# Patient Record
Sex: Female | Born: 1960 | Race: White | Hispanic: No | Smoking: Former smoker
Health system: Southern US, Community
[De-identification: ages and names within clinical notes are randomized; demographics above are authoritative.]

## PROBLEM LIST (undated history)

## (undated) DIAGNOSIS — J181 Lobar pneumonia, unspecified organism: Secondary | ICD-10-CM

## (undated) DIAGNOSIS — D649 Anemia, unspecified: Secondary | ICD-10-CM

## (undated) DIAGNOSIS — G894 Chronic pain syndrome: Secondary | ICD-10-CM

## (undated) DIAGNOSIS — J9621 Acute and chronic respiratory failure with hypoxia: Secondary | ICD-10-CM

## (undated) DIAGNOSIS — I70209 Unspecified atherosclerosis of native arteries of extremities, unspecified extremity: Secondary | ICD-10-CM

## (undated) DIAGNOSIS — I739 Peripheral vascular disease, unspecified: Secondary | ICD-10-CM

## (undated) DIAGNOSIS — I2510 Atherosclerotic heart disease of native coronary artery without angina pectoris: Secondary | ICD-10-CM

## (undated) DIAGNOSIS — K922 Gastrointestinal hemorrhage, unspecified: Secondary | ICD-10-CM

## (undated) DIAGNOSIS — E1151 Type 2 diabetes mellitus with diabetic peripheral angiopathy without gangrene: Secondary | ICD-10-CM

## (undated) DIAGNOSIS — I482 Chronic atrial fibrillation, unspecified: Secondary | ICD-10-CM

## (undated) HISTORY — PX: ANGIOPLASTY: SHX39

## (undated) HISTORY — PX: KNEE SURGERY: SHX244

## (undated) HISTORY — PX: TONSILLECTOMY: SUR1361

## (undated) HISTORY — PX: CHOLECYSTECTOMY: SHX55

## (undated) HISTORY — PX: CORONARY ARTERY BYPASS GRAFT: SHX141

## (undated) HISTORY — PX: COLONOSCOPY: SHX174

## (undated) HISTORY — PX: HYSTERECTOMY ABDOMINAL WITH SALPINGECTOMY: SHX6725

---

## 2017-07-14 ENCOUNTER — Other Ambulatory Visit (HOSPITAL_COMMUNITY): Payer: Medicare Other

## 2017-07-14 ENCOUNTER — Inpatient Hospital Stay
Admission: RE | Admit: 2017-07-14 | Discharge: 2017-07-25 | Disposition: A | Discharge status: Nursing Home (Includes ICF) | Payer: Medicare Other | Source: Ambulatory Visit | Attending: Internal Medicine | Admitting: Internal Medicine

## 2017-07-14 DIAGNOSIS — J181 Lobar pneumonia, unspecified organism: Secondary | ICD-10-CM | POA: Diagnosis present

## 2017-07-14 DIAGNOSIS — J9690 Respiratory failure, unspecified, unspecified whether with hypoxia or hypercapnia: Secondary | ICD-10-CM

## 2017-07-14 DIAGNOSIS — I480 Paroxysmal atrial fibrillation: Secondary | ICD-10-CM | POA: Diagnosis present

## 2017-07-14 DIAGNOSIS — I482 Chronic atrial fibrillation, unspecified: Secondary | ICD-10-CM | POA: Diagnosis present

## 2017-07-14 DIAGNOSIS — J9621 Acute and chronic respiratory failure with hypoxia: Secondary | ICD-10-CM | POA: Diagnosis present

## 2017-07-14 DIAGNOSIS — Z978 Presence of other specified devices: Secondary | ICD-10-CM

## 2017-07-14 DIAGNOSIS — I2510 Atherosclerotic heart disease of native coronary artery without angina pectoris: Secondary | ICD-10-CM | POA: Diagnosis present

## 2017-07-14 DIAGNOSIS — T85598A Other mechanical complication of other gastrointestinal prosthetic devices, implants and grafts, initial encounter: Secondary | ICD-10-CM

## 2017-07-14 HISTORY — DX: Acute and chronic respiratory failure with hypoxia: J96.21

## 2017-07-14 HISTORY — DX: Lobar pneumonia, unspecified organism: J18.1

## 2017-07-14 HISTORY — DX: Anemia, unspecified: D64.9

## 2017-07-14 HISTORY — DX: Type 2 diabetes mellitus with diabetic peripheral angiopathy without gangrene: E11.51

## 2017-07-14 HISTORY — DX: Chronic atrial fibrillation, unspecified: I48.20

## 2017-07-14 HISTORY — DX: Unspecified atherosclerosis of native arteries of extremities, unspecified extremity: I70.209

## 2017-07-14 HISTORY — DX: Chronic pain syndrome: G89.4

## 2017-07-14 HISTORY — DX: Gastrointestinal hemorrhage, unspecified: K92.2

## 2017-07-14 HISTORY — DX: Peripheral vascular disease, unspecified: I73.9

## 2017-07-14 HISTORY — DX: Atherosclerotic heart disease of native coronary artery without angina pectoris: I25.10

## 2017-07-14 LAB — BLOOD GAS, ARTERIAL
Acid-Base Excess: 1.2 mmol/L (ref 0.0–2.0)
BICARBONATE: 24.3 mmol/L (ref 20.0–28.0)
FIO2: 35
O2 Saturation: 98.4 %
PATIENT TEMPERATURE: 100.5
PEEP: 5 cmH2O
RATE: 20 resp/min
VT: 450 mL
pCO2 arterial: 33.5 mmHg (ref 32.0–48.0)
pH, Arterial: 7.479 — ABNORMAL HIGH (ref 7.350–7.450)
pO2, Arterial: 107 mmHg (ref 83.0–108.0)

## 2017-07-14 LAB — URINALYSIS, ROUTINE W REFLEX MICROSCOPIC
BILIRUBIN URINE: NEGATIVE
Glucose, UA: NEGATIVE mg/dL
Ketones, ur: 5 mg/dL — AB
Nitrite: NEGATIVE
PROTEIN: 100 mg/dL — AB
SPECIFIC GRAVITY, URINE: 1.017 (ref 1.005–1.030)
pH: 5 (ref 5.0–8.0)

## 2017-07-15 LAB — C DIFFICILE QUICK SCREEN W PCR REFLEX
C DIFFICILE (CDIFF) INTERP: DETECTED
C Diff antigen: POSITIVE — AB
C Diff toxin: POSITIVE — AB

## 2017-07-15 LAB — CBC WITH DIFFERENTIAL/PLATELET
Abs Immature Granulocytes: 0.1 10*3/uL (ref 0.0–0.1)
Basophils Absolute: 0 10*3/uL (ref 0.0–0.1)
Basophils Relative: 0 %
EOS PCT: 1 %
Eosinophils Absolute: 0.1 10*3/uL (ref 0.0–0.7)
HEMATOCRIT: 33.7 % — AB (ref 36.0–46.0)
HEMOGLOBIN: 11.1 g/dL — AB (ref 12.0–15.0)
Immature Granulocytes: 1 %
LYMPHS ABS: 0.8 10*3/uL (ref 0.7–4.0)
Lymphocytes Relative: 7 %
MCH: 29.5 pg (ref 26.0–34.0)
MCHC: 32.9 g/dL (ref 30.0–36.0)
MCV: 89.6 fL (ref 78.0–100.0)
MONO ABS: 0.8 10*3/uL (ref 0.1–1.0)
Monocytes Relative: 8 %
Neutro Abs: 8.5 10*3/uL — ABNORMAL HIGH (ref 1.7–7.7)
Neutrophils Relative %: 83 %
Platelets: 189 10*3/uL (ref 150–400)
RBC: 3.76 MIL/uL — ABNORMAL LOW (ref 3.87–5.11)
RDW: 16.2 % — ABNORMAL HIGH (ref 11.5–15.5)
WBC: 10.2 10*3/uL (ref 4.0–10.5)

## 2017-07-15 LAB — HEMOGLOBIN A1C
HEMOGLOBIN A1C: 6.1 % — AB (ref 4.8–5.6)
MEAN PLASMA GLUCOSE: 128.37 mg/dL

## 2017-07-15 LAB — COMPREHENSIVE METABOLIC PANEL
ALBUMIN: 2.7 g/dL — AB (ref 3.5–5.0)
ALT: 15 U/L (ref 0–44)
ANION GAP: 13 (ref 5–15)
AST: 17 U/L (ref 15–41)
Alkaline Phosphatase: 69 U/L (ref 38–126)
BUN: 32 mg/dL — AB (ref 6–20)
CALCIUM: 9.9 mg/dL (ref 8.9–10.3)
CO2: 24 mmol/L (ref 22–32)
Chloride: 101 mmol/L (ref 98–111)
Creatinine, Ser: 1 mg/dL (ref 0.44–1.00)
GFR calc Af Amer: >60 mL/min (ref >60)
GLUCOSE: 179 mg/dL — AB (ref 70–99)
Potassium: 3.3 mmol/L — ABNORMAL LOW (ref 3.5–5.1)
Sodium: 138 mmol/L (ref 135–145)
TOTAL PROTEIN: 6.9 g/dL (ref 6.5–8.1)
Total Bilirubin: 0.9 mg/dL (ref 0.3–1.2)

## 2017-07-15 LAB — PROTIME-INR
INR: 1.12
Prothrombin Time: 14.3 seconds (ref 11.4–15.2)

## 2017-07-15 LAB — URINE CULTURE

## 2017-07-15 LAB — TSH: TSH: 3.047 u[IU]/mL (ref 0.350–4.500)

## 2017-07-15 LAB — PHOSPHORUS: Phosphorus: 3.2 mg/dL (ref 2.5–4.6)

## 2017-07-15 LAB — MAGNESIUM: Magnesium: 2.1 mg/dL (ref 1.7–2.4)

## 2017-07-16 ENCOUNTER — Encounter: Payer: Self-pay | Admitting: Internal Medicine

## 2017-07-16 DIAGNOSIS — I482 Chronic atrial fibrillation, unspecified: Secondary | ICD-10-CM | POA: Diagnosis present

## 2017-07-16 DIAGNOSIS — I480 Paroxysmal atrial fibrillation: Secondary | ICD-10-CM | POA: Diagnosis present

## 2017-07-16 DIAGNOSIS — I2510 Atherosclerotic heart disease of native coronary artery without angina pectoris: Secondary | ICD-10-CM | POA: Diagnosis not present

## 2017-07-16 DIAGNOSIS — J181 Lobar pneumonia, unspecified organism: Secondary | ICD-10-CM | POA: Diagnosis not present

## 2017-07-16 DIAGNOSIS — I2583 Coronary atherosclerosis due to lipid rich plaque: Secondary | ICD-10-CM

## 2017-07-16 DIAGNOSIS — J9621 Acute and chronic respiratory failure with hypoxia: Secondary | ICD-10-CM | POA: Diagnosis not present

## 2017-07-16 LAB — BASIC METABOLIC PANEL
ANION GAP: 9 (ref 5–15)
BUN: 34 mg/dL — AB (ref 6–20)
CO2: 29 mmol/L (ref 22–32)
CREATININE: 0.98 mg/dL (ref 0.44–1.00)
Calcium: 10.1 mg/dL (ref 8.9–10.3)
Chloride: 101 mmol/L (ref 98–111)
GFR calc Af Amer: >60 mL/min (ref >60)
GFR calc non Af Amer: >60 mL/min (ref >60)
GLUCOSE: 217 mg/dL — AB (ref 70–99)
Potassium: 4.1 mmol/L (ref 3.5–5.1)
Sodium: 139 mmol/L (ref 135–145)

## 2017-07-16 LAB — PHOSPHORUS: Phosphorus: 3.2 mg/dL (ref 2.5–4.6)

## 2017-07-16 LAB — CBC
HEMATOCRIT: 33.4 % — AB (ref 36.0–46.0)
Hemoglobin: 10.9 g/dL — ABNORMAL LOW (ref 12.0–15.0)
MCH: 29.3 pg (ref 26.0–34.0)
MCHC: 32.6 g/dL (ref 30.0–36.0)
MCV: 89.8 fL (ref 78.0–100.0)
Platelets: 202 10*3/uL (ref 150–400)
RBC: 3.72 MIL/uL — AB (ref 3.87–5.11)
RDW: 15.9 % — AB (ref 11.5–15.5)
WBC: 9.3 10*3/uL (ref 4.0–10.5)

## 2017-07-16 LAB — MAGNESIUM: Magnesium: 2.3 mg/dL (ref 1.7–2.4)

## 2017-07-16 NOTE — Consult Note (Signed)
Pulmonary Critical Care Medicine [facility] [place]  PULMONARY SERVICE  Date of Service: 07/16/2017  PULMONARY CONSULT   Martha Carter  UJW:[id number]  DOB: 1960-11-02   DOA: 07/14/2017  Referring Physician: Carron Curie, MD  HPI: Martha Carter is a 57 y.o. female seen for follow up of Acute on Chronic Respiratory Failure.  Patient has complicated course as follows patient has a medical history significant coronary artery disease status post CABG peripheral arterial disease hypertension presented to the hospital with complaints of chest pain.  Patient is also been complaining of having some nausea and vomiting.  She was seen for the pain which slightly improved after 1 nitroglycerin.  At the time did not appear to be any radiation.  Patient had a chest x-ray done which showed pneumonia.  She was started on IV antibiotics and while she was admitted at increased work of breathing and decompensated eventually ended up having to be intubated.  After treatment on the ventilator patient was attempted at extubation however failed.  Patient was placed back on the ventilator with a size 8 ET tube.  She is been on antibiotics and has completed the course.  The patient was transferred to our facility for further management and weaning.  Review of Systems:  ROS performed and is unremarkable other than noted above.  Past Medical History:  Diagnosis Date  . Acute on chronic respiratory failure with hypoxia (HCC)   . Chronic anemia   . Chronic atrial fibrillation (HCC)   . Chronic pain syndrome   . Coronary artery disease   . 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 GI bleed   . Lobar pneumonia, unspecified organism (HCC)   . Peripheral arterial disease [facility])     Past Surgical History:  Procedure Laterality Date  . ANGIOPLASTY    . CHOLECYSTECTOMY    . COLONOSCOPY    . CORONARY ARTERY BYPASS GRAFT    . HYSTERECTOMY ABDOMINAL WITH SALPINGECTOMY     . KNEE SURGERY    . TONSILLECTOMY      Social History:    reports that she has quit smoking. She has never used smokeless tobacco. She reports that she drank alcohol. She reports that she has current or past drug history.  Family History: Non-Contributory to the present illness  Allergies include sulfa clindamycin ACE inhibitors doxycycline hydromorphone and morphine   Medications: Reviewed on Rounds  Physical Exam:  Vitals: Temperature 98.9 pulse 100 respiratory 24 blood pressure 130/73 saturations 99%  Ventilator Settings mode of ventilation assist control FiO2 35% tidal volume 467.5  . General: Comfortable at this time . Eyes: Grossly normal lids, irises & conjunctiva . ENT: grossly tongue is normal . Neck: no obvious mass . Cardiovascular: S1-S2 normal no gallop or rub . Respiratory: Coarse rhonchi bilaterally . Abdomen: Obese and soft . Skin: no rash seen on limited exam . Musculoskeletal: not rigid . Psychiatric:unable to assess . Neurologic: no seizure no involuntary movements         Labs on Admission:  Basic Metabolic Panel: Recent Labs  Lab 07/15/17 0618 07/16/17 0651  NA 138 139  K 3.3* 4.1  CL 101 101  CO2 24 29  GLUCOSE 179* 217*  BUN 32* 34*  CREATININE 1.00 0.98  CALCIUM 9.9 10.1  MG 2.1 2.3  PHOS 3.2 3.2    Liver Function Tests: Recent Labs  Lab 07/15/17 0618  AST 17  ALT 15  ALKPHOS 69  BILITOT 0.9  PROT 6.9  ALBUMIN 2.7*   No results for input(s): LIPASE, AMYLASE in the last 168 hours. No results for input(s): AMMONIA in the last 168 hours.  CBC: Recent Labs  Lab 07/15/17 0618 07/16/17 0651  WBC 10.2 9.3  NEUTROABS 8.5*  --   HGB 11.1* 10.9*  HCT 33.7* 33.4*  MCV 89.6 89.8  PLT 189 202    Cardiac Enzymes: No results for input(s): CKTOTAL, CKMB, CKMBINDEX, TROPONINI in the last 168 hours.  BNP (last 3 results) No results for input(s): BNP in the last 8760 hours.  ProBNP (last 3 results) No results for  input(s): PROBNP in the last 8760 hours.   Radiological Exams on Admission: Dg Chest Port 1 View  Result Date: 07/14/2017 CLINICAL DATA:  Intubation. EXAM: PORTABLE CHEST 1 VIEW COMPARISON:  None. FINDINGS: Endotracheal tube tip projects 4.6 cm above the Carina. Nasal/orogastric tube passes well below the diaphragm into the stomach and below the included field of view. Right PICC tip projects at the caval atrial junction. Lungs demonstrate vascular congestion, interstitial prominence and mild hazy opacity. More confluent opacity is noted at the left lung base consistent with pleural fluid. There is a probable small right pleural effusion. There changes from a prior median sternotomy. Cardiac silhouette is normal in size. No convincing mediastinal or hilar masses. No evidence of a pneumothorax on this semi-erect study. IMPRESSION: 1. Endotracheal tube tip projects 4.6 cm above the Carina. 2. Well-positioned nasal/orogastric tube and right PICC. 3. Findings consistent with congestive heart failure with interstitial and hazy airspace pulmonary edema and left greater than right pleural effusions. Electronically Signed   By: Amie Portlandavid  Ormond M.D.   On: 07/14/2017 18:31   Dg Abd Portable 1v  Result Date: 07/14/2017 CLINICAL DATA:  Encounter for nasogastric tube placement. EXAM: PORTABLE ABDOMEN - 1 VIEW COMPARISON:  None. FINDINGS: Nasogastric tube passes well below the diaphragm into the mid stomach. There is no bowel dilation to suggest obstruction. There are surgical clips are upper quadrant from a prior cholecystectomy. IMPRESSION: Well-positioned nasogastric tube. Electronically Signed   By: Amie Portlandavid  Ormond M.D.   On: 07/14/2017 18:31    Assessment/Plan Active Problems:   Acute on chronic respiratory failure with hypoxia (HCC)   Lobar pneumonia, unspecified organism (HCC)   Coronary artery disease   Chronic atrial fibrillation (HCC)   1. Acute on chronic respiratory failure with hypoxia patient  remains on full support orally intubated on the ventilator.  She is failed extubation trial at the other facility.  Spoken with her she is has issues with anxiety currently is on propofol.  She is also requiring restraints because she is been pulling at her devices.  Spoke with her and her brother who was in the room try to keep her calm and relaxed hopefully we should be able to try to start on weaning protocol and see if she is possible if she fails then will need a tracheostomy done. 2. Lobar pneumonia treated with antibiotics last chest x-ray shows some haziness possible congestive heart failure.  Continue to follow-up with x-ray as discussed with primary care team. 3. Coronary artery disease stable currently is pain-free 4. Chronic atrial fibrillation rate is controlled we will continue to follow along.  I have personally seen and evaluated the patient, evaluated laboratory and imaging results, formulated the assessment and plan and placed orders. The Patient requires high complexity decision making for assessment and support.  Case was discussed on Rounds with the Respiratory Therapy Staff patient is critically  ill in danger of cardiac arrest remains orally intubated requiring advanced monitoring techniques as well as sedation with propofol.  Time spent 40 minutes critical care  Yevonne Pax, MD [facility] Pulmonary Critical Care Medicine Sleep Medicine

## 2017-07-17 ENCOUNTER — Other Ambulatory Visit (HOSPITAL_COMMUNITY): Payer: Medicare Other

## 2017-07-17 DIAGNOSIS — I482 Chronic atrial fibrillation: Secondary | ICD-10-CM | POA: Diagnosis not present

## 2017-07-17 DIAGNOSIS — J181 Lobar pneumonia, unspecified organism: Secondary | ICD-10-CM | POA: Diagnosis not present

## 2017-07-17 DIAGNOSIS — I2510 Atherosclerotic heart disease of native coronary artery without angina pectoris: Secondary | ICD-10-CM | POA: Diagnosis not present

## 2017-07-17 DIAGNOSIS — J9621 Acute and chronic respiratory failure with hypoxia: Secondary | ICD-10-CM | POA: Diagnosis not present

## 2017-07-17 LAB — CBC
HCT: 33.7 % — ABNORMAL LOW (ref 36.0–46.0)
HEMOGLOBIN: 10.9 g/dL — AB (ref 12.0–15.0)
MCH: 28.8 pg (ref 26.0–34.0)
MCHC: 32.3 g/dL (ref 30.0–36.0)
MCV: 88.9 fL (ref 78.0–100.0)
Platelets: 213 10*3/uL (ref 150–400)
RBC: 3.79 MIL/uL — ABNORMAL LOW (ref 3.87–5.11)
RDW: 15.7 % — ABNORMAL HIGH (ref 11.5–15.5)
WBC: 9.8 10*3/uL (ref 4.0–10.5)

## 2017-07-17 LAB — CULTURE, RESPIRATORY

## 2017-07-17 LAB — CULTURE, RESPIRATORY W GRAM STAIN

## 2017-07-17 LAB — BASIC METABOLIC PANEL
Anion gap: 13 (ref 5–15)
BUN: 38 mg/dL — ABNORMAL HIGH (ref 6–20)
CHLORIDE: 100 mmol/L (ref 98–111)
CO2: 30 mmol/L (ref 22–32)
CREATININE: 0.97 mg/dL (ref 0.44–1.00)
Calcium: 10.2 mg/dL (ref 8.9–10.3)
GFR calc Af Amer: >60 mL/min (ref >60)
Glucose, Bld: 235 mg/dL — ABNORMAL HIGH (ref 70–99)
Potassium: 4.2 mmol/L (ref 3.5–5.1)
SODIUM: 143 mmol/L (ref 135–145)

## 2017-07-17 LAB — MAGNESIUM: MAGNESIUM: 2.4 mg/dL (ref 1.7–2.4)

## 2017-07-17 LAB — PHOSPHORUS: Phosphorus: 3.3 mg/dL (ref 2.5–4.6)

## 2017-07-17 MED ORDER — MELATONIN 3 MG PO TABS
3.00 | ORAL_TABLET | ORAL | Status: DC
Start: 2017-07-14 — End: 2017-07-17

## 2017-07-17 MED ORDER — ASPIRIN 81 MG PO CHEW
81.00 | CHEWABLE_TABLET | ORAL | Status: DC
Start: 2017-07-15 — End: 2017-07-17

## 2017-07-17 MED ORDER — DIPHENHYDRAMINE HCL 50 MG/ML IJ SOLN
12.50 | INTRAMUSCULAR | Status: DC
Start: 2017-07-15 — End: 2017-07-17

## 2017-07-17 MED ORDER — BISACODYL 5 MG PO TBEC
10.00 | DELAYED_RELEASE_TABLET | ORAL | Status: DC
Start: ? — End: 2017-07-17

## 2017-07-17 MED ORDER — FAMOTIDINE 20 MG PO TABS
20.00 | ORAL_TABLET | ORAL | Status: DC
Start: 2017-07-15 — End: 2017-07-17

## 2017-07-17 MED ORDER — HYDROCODONE-ACETAMINOPHEN 5-325 MG PO TABS
1.00 | ORAL_TABLET | ORAL | Status: DC
Start: ? — End: 2017-07-17

## 2017-07-17 MED ORDER — PANTOPRAZOLE SODIUM 40 MG IV SOLR
40.00 | INTRAVENOUS | Status: DC
Start: 2017-07-14 — End: 2017-07-17

## 2017-07-17 MED ORDER — GUAIFENESIN 100 MG/5ML PO SYRP
200.00 | ORAL_SOLUTION | ORAL | Status: DC
Start: ? — End: 2017-07-17

## 2017-07-17 MED ORDER — GENERIC EXTERNAL MEDICATION
Status: DC
Start: ? — End: 2017-07-17

## 2017-07-17 MED ORDER — PROPOFOL 100 MG/10ML IV EMUL
0.00 | INTRAVENOUS | Status: DC
Start: ? — End: 2017-07-17

## 2017-07-17 MED ORDER — ACETAMINOPHEN 325 MG PO TABS
650.00 | ORAL_TABLET | ORAL | Status: DC
Start: ? — End: 2017-07-17

## 2017-07-17 MED ORDER — FUROSEMIDE 10 MG/ML IJ SOLN
40.00 | INTRAMUSCULAR | Status: DC
Start: 2017-07-15 — End: 2017-07-17

## 2017-07-17 MED ORDER — NITROGLYCERIN 0.4 MG SL SUBL
0.40 | SUBLINGUAL_TABLET | SUBLINGUAL | Status: DC
Start: ? — End: 2017-07-17

## 2017-07-17 MED ORDER — GABAPENTIN 300 MG PO CAPS
300.00 | ORAL_CAPSULE | ORAL | Status: DC
Start: 2017-07-14 — End: 2017-07-17

## 2017-07-17 MED ORDER — ONDANSETRON HCL 4 MG/2ML IJ SOLN
8.00 | INTRAMUSCULAR | Status: DC
Start: ? — End: 2017-07-17

## 2017-07-17 MED ORDER — ATORVASTATIN CALCIUM 10 MG PO TABS
20.00 | ORAL_TABLET | ORAL | Status: DC
Start: 2017-07-14 — End: 2017-07-17

## 2017-07-17 MED ORDER — LORAZEPAM 2 MG/ML IJ SOLN
1.00 | INTRAMUSCULAR | Status: DC
Start: ? — End: 2017-07-17

## 2017-07-17 MED ORDER — PROMETHAZINE HCL 25 MG/ML IJ SOLN
6.25 | INTRAMUSCULAR | Status: DC
Start: ? — End: 2017-07-17

## 2017-07-17 MED ORDER — INSULIN LISPRO 100 UNIT/ML ~~LOC~~ SOLN
5.00 | SUBCUTANEOUS | Status: DC
Start: 2017-07-14 — End: 2017-07-17

## 2017-07-17 MED ORDER — NYSTATIN 100000 UNIT/GM EX POWD
1.00 | CUTANEOUS | Status: DC
Start: 2017-07-14 — End: 2017-07-17

## 2017-07-17 MED ORDER — THERA-M PO TABS
1.00 | ORAL_TABLET | ORAL | Status: DC
Start: 2017-07-15 — End: 2017-07-17

## 2017-07-17 MED ORDER — NITROGLYCERIN 2 % TD OINT
.50 | TOPICAL_OINTMENT | TRANSDERMAL | Status: DC
Start: 2017-07-14 — End: 2017-07-17

## 2017-07-17 MED ORDER — CARVEDILOL 25 MG PO TABS
25.00 | ORAL_TABLET | ORAL | Status: DC
Start: 2017-07-14 — End: 2017-07-17

## 2017-07-17 MED ORDER — INSULIN REGULAR HUMAN 100 UNIT/ML IJ SOLN
0.00 | INTRAMUSCULAR | Status: DC
Start: 2017-07-14 — End: 2017-07-17

## 2017-07-17 MED ORDER — IPRATROPIUM-ALBUTEROL 0.5-2.5 (3) MG/3ML IN SOLN
3.00 | RESPIRATORY_TRACT | Status: DC
Start: 2017-07-14 — End: 2017-07-17

## 2017-07-17 MED ORDER — DOCUSATE SODIUM 100 MG PO CAPS
100.00 | ORAL_CAPSULE | ORAL | Status: DC
Start: ? — End: 2017-07-17

## 2017-07-17 MED ORDER — CITALOPRAM HYDROBROMIDE 20 MG PO TABS
10.00 | ORAL_TABLET | ORAL | Status: DC
Start: 2017-07-15 — End: 2017-07-17

## 2017-07-17 MED ORDER — POTASSIUM CHLORIDE CRYS ER 20 MEQ PO TBCR
20.00 | EXTENDED_RELEASE_TABLET | ORAL | Status: DC
Start: 2017-07-15 — End: 2017-07-17

## 2017-07-17 NOTE — Progress Notes (Signed)
[facility] [place]   PULMONARY SERVICE  PROGRESS NOTE  Date of Service: 07/17/2017  Laurina BustleGlenda W Ferriss  WUJ:[id number]  DOB: 04/15/1960   DOA: 07/14/2017  Referring Physician: Carron CurieAli Hijazi, MD  HPI: Laurina BustleGlenda W Graw is a [age] y.o. female seen for follow up of Acute on Chronic Respiratory Failure.  She is doing well was not having much breathing above the spontaneous or above the set rate.  Respiratory rate was therefore ordered to be decreased.  Patient right now is on assist control mode  Medications: Reviewed on Rounds  Physical Exam:  Vitals: Temperature 98.8 pulse 94 respiratory 18 blood pressure 112/95 saturations 100%  Ventilator Settings mode of ventilation assist control FiO2 28% tidal volume 435 PEEP 5  . General: Comfortable at this time . Eyes: Grossly normal lids, irises & conjunctiva . ENT: grossly tongue is normal . Neck: no obvious mass . Cardiovascular: S1 S2 normal no gallop . Respiratory: Scattered rhonchi noted . Abdomen: soft . Skin: no rash seen on limited exam . Musculoskeletal: not rigid . Psychiatric:unable to assess . Neurologic: no seizure no involuntary movements         Lab Data:   Basic Metabolic Panel: Recent Labs  Lab 07/15/17 0618 07/16/17 0651 07/17/17 0705  NA 138 139 143  K 3.3* 4.1 4.2  CL 101 101 100  CO2 24 29 30   GLUCOSE 179* 217* 235*  BUN 32* 34* 38*  CREATININE 1.00 0.98 0.97  CALCIUM 9.9 10.1 10.2  MG 2.1 2.3 2.4  PHOS 3.2 3.2 3.3    Liver Function Tests: Recent Labs  Lab 07/15/17 0618  AST 17  ALT 15  ALKPHOS 69  BILITOT 0.9  PROT 6.9  ALBUMIN 2.7*   No results for input(s): LIPASE, AMYLASE in the last 168 hours. No results for input(s): AMMONIA in the last 168 hours.  CBC: Recent Labs  Lab 07/15/17 0618 07/16/17 0651 07/17/17 0705  WBC 10.2 9.3 9.8  NEUTROABS 8.5*  --   --   HGB 11.1* 10.9* 10.9*  HCT 33.7* 33.4* 33.7*  MCV 89.6 89.8 88.9  PLT 189 202  213    Cardiac Enzymes: No results for input(s): CKTOTAL, CKMB, CKMBINDEX, TROPONINI in the last 168 hours.  BNP (last 3 results) No results for input(s): BNP in the last 8760 hours.  ProBNP (last 3 results) No results for input(s): PROBNP in the last 8760 hours.  Radiological Exams: Dg Chest Port 1 View  Result Date: 07/17/2017 CLINICAL DATA:  57 year old female with respiratory failure. Initial encounter. EXAM: PORTABLE CHEST 1 VIEW COMPARISON:  07/14/2017 chest x-ray. FINDINGS: Endotracheal tube tip 2.8 cm above the carina. Right central line tip distal superior vena cava level. Nasogastric tube courses below the diaphragm. Tip is not included on the present exam. Side hole just beyond the gastroesophageal junction. Improved aeration right lung. Persistent consolidation left lung which may partially be explained by posteriorly layering left-sided pleural effusion. Left base atelectasis or infiltrate may be present. No pneumothorax noted. Mild cardiomegaly. No acute osseous abnormality. IMPRESSION: Improved aeration right lung. Suspect posteriorly layering left-sided pleural effusion and left base atelectasis versus infiltrate. Mild cardiomegaly. Electronically Signed   By: Lacy DuverneySteven  Olson M.D.   On: 07/17/2017 07:54    Assessment/Plan Active Problems:   Acute on chronic respiratory failure with hypoxia (HCC)   Lobar pneumonia, unspecified organism (HCC)   Coronary artery disease   Chronic atrial fibrillation (HCC)   1. Acute on chronic respiratory failure  with hypoxia we will continue to try to wean assess for pressure support again later today.  Continue pulmonary toilet supportive care. 2. Lobar pneumonia treated with antibiotics we will continue to follow 3. Coronary artery disease stable 4. Chronic atrial fibrillation rate is controlled   I have personally seen and evaluated the patient, evaluated laboratory and imaging results, formulated the assessment and plan and placed  orders. The Patient requires high complexity decision making for assessment and support.  Case was discussed on Rounds with the Respiratory Therapy Staff  Yevonne Pax, MD [facility] Pulmonary Critical Care Medicine Sleep Medicine

## 2017-07-18 ENCOUNTER — Other Ambulatory Visit (HOSPITAL_COMMUNITY): Payer: Medicare Other

## 2017-07-18 DIAGNOSIS — I482 Chronic atrial fibrillation: Secondary | ICD-10-CM | POA: Diagnosis not present

## 2017-07-18 DIAGNOSIS — J9621 Acute and chronic respiratory failure with hypoxia: Secondary | ICD-10-CM | POA: Diagnosis not present

## 2017-07-18 DIAGNOSIS — I2510 Atherosclerotic heart disease of native coronary artery without angina pectoris: Secondary | ICD-10-CM | POA: Diagnosis not present

## 2017-07-18 DIAGNOSIS — J181 Lobar pneumonia, unspecified organism: Secondary | ICD-10-CM | POA: Diagnosis not present

## 2017-07-18 NOTE — Progress Notes (Signed)
Pulmonary Critical Care Medicine [facility] [place]   PULMONARY SERVICE  PROGRESS NOTE  Date of Service: 07/18/2017  Martha Carter  [id number]  DOB: [birth year]   DOA: 07/14/2017  Referring Physician: Carron Curie, MD  HPI: Martha Carter is a 57 y.o. female seen for follow up of Acute on Chronic Respiratory Failure.  Patient has been attempted on weaning as failed.  Right now is on full support orally intubated currently is on assist control mode.  Medications: Reviewed on Rounds  Physical Exam:  Vitals: Temperature 99.5 pulse 95 respiratory rate 10 blood pressure 122/67 saturations 97%  Ventilator Settings mode of ventilation assist control FiO2 20% tidal volume 430 PEEP 5  . General: Comfortable at this time . Eyes: Grossly normal lids, irises & conjunctiva . ENT: grossly tongue is normal . Neck: no obvious mass . Cardiovascular: S1 S2 normal no gallop . Respiratory: No rhonchi or rales are noted at this time . Abdomen: soft . Skin: no rash seen on limited exam . Musculoskeletal: not rigid . Psychiatric:unable to assess . Neurologic: no seizure no involuntary movements         Lab Data:   Basic Metabolic Panel: Recent Labs  Lab 07/15/17 0618 07/16/17 0651 07/17/17 0705  NA 138 139 143  K 3.3* 4.1 4.2  CL 101 101 100  CO2 24 29 30   GLUCOSE 179* 217* 235*  BUN 32* 34* 38*  CREATININE 1.00 0.98 0.97  CALCIUM 9.9 10.1 10.2  MG 2.1 2.3 2.4  PHOS 3.2 3.2 3.3    Liver Function Tests: Recent Labs  Lab 07/15/17 0618  AST 17  ALT 15  ALKPHOS 69  BILITOT 0.9  PROT 6.9  ALBUMIN 2.7*   No results for input(s): LIPASE, AMYLASE in the last 168 hours. No results for input(s): AMMONIA in the last 168 hours.  CBC: Recent Labs  Lab 07/15/17 0618 07/16/17 0651 07/17/17 0705  WBC 10.2 9.3 9.8  NEUTROABS 8.5*  --   --   HGB 11.1* 10.9* 10.9*  HCT 33.7* 33.4* 33.7*  MCV 89.6 89.8 88.9  PLT 189 202 213    Cardiac Enzymes: No results  for input(s): CKTOTAL, CKMB, CKMBINDEX, TROPONINI in the last 168 hours.  BNP (last 3 results) No results for input(s): BNP in the last 8760 hours.  ProBNP (last 3 results) No results for input(s): PROBNP in the last 8760 hours.  Radiological Exams: Dg Chest Port 1 View  Result Date: 07/17/2017 CLINICAL DATA:  57 year old female with respiratory failure. Initial encounter. EXAM: PORTABLE CHEST 1 VIEW COMPARISON:  07/14/2017 chest x-ray. FINDINGS: Endotracheal tube tip 2.8 cm above the carina. Right central line tip distal superior vena cava level. Nasogastric tube courses below the diaphragm. Tip is not included on the present exam. Side hole just beyond the gastroesophageal junction. Improved aeration right lung. Persistent consolidation left lung which may partially be explained by posteriorly layering left-sided pleural effusion. Left base atelectasis or infiltrate may be present. No pneumothorax noted. Mild cardiomegaly. No acute osseous abnormality. IMPRESSION: Improved aeration right lung. Suspect posteriorly layering left-sided pleural effusion and left base atelectasis versus infiltrate. Mild cardiomegaly. Electronically Signed   By: Lacy Duverney M.D.   On: 07/17/2017 07:54   Dg Abd Portable 1v  Result Date: 07/18/2017 CLINICAL DATA:  Feeding tube placement. EXAM: PORTABLE ABDOMEN - 1 VIEW COMPARISON:  07/14/2017. FINDINGS: Prior CABG. Endotracheal tube noted with its tip 3 cm above the carina. NG tube noted with tip below left hemidiaphragm.  Tip is in stable position. No gastric distention. Surgical clips right upper quadrant. IMPRESSION: NG tube noted with tip below left hemidiaphragm. Tip is in stable position. Electronically Signed   By: Thomas  Register   On: 07/18/2017 07:34    AsMaisie Fussessment/Plan Active Problems:   Acute on chronic respiratory failure with hypoxia (HCC)   Lobar pneumonia, unspecified organism (HCC)   Coronary artery disease   Chronic atrial fibrillation  (HCC)   1. Acute on chronic respiratory failure with hypoxia we will continue with full vent support will more than likely end up needing to have a tracheostomy continue secretion management. 2. Lobar pneumonia treated with antibiotics last chest x-ray had shown some basilar infiltrates.  We will continue to monitor radiologically. 3. Coronary artery disease stable 4. Chronic atrial fibrillation rate is controlled we will continue present management   I have personally seen and evaluated the patient, evaluated laboratory and imaging results, formulated the assessment and plan and placed orders. The Patient requires high complexity decision making for assessment and support.  Case was discussed on Rounds with the Respiratory Therapy Staff  Yevonne PaxSaadat A Carrie Usery, MD [facility] Pulmonary Critical Care Medicine Sleep Medicine

## 2017-07-19 DIAGNOSIS — I482 Chronic atrial fibrillation: Secondary | ICD-10-CM | POA: Diagnosis not present

## 2017-07-19 DIAGNOSIS — I2510 Atherosclerotic heart disease of native coronary artery without angina pectoris: Secondary | ICD-10-CM | POA: Diagnosis not present

## 2017-07-19 DIAGNOSIS — J9621 Acute and chronic respiratory failure with hypoxia: Secondary | ICD-10-CM | POA: Diagnosis not present

## 2017-07-19 DIAGNOSIS — J181 Lobar pneumonia, unspecified organism: Secondary | ICD-10-CM | POA: Diagnosis not present

## 2017-07-19 DIAGNOSIS — I2583 Coronary atherosclerosis due to lipid rich plaque: Secondary | ICD-10-CM | POA: Diagnosis not present

## 2017-07-19 NOTE — Progress Notes (Signed)
Pulmonary Critical Care Medicine [facility] [place]   PULMONARY SERVICE  PROGRESS NOTE  Date of Service: 07/19/2017  MARCELYN RUPPE  [id number]  DOB: 02/13/1960   DOA: 07/14/2017  Referring Physician: Carron Curie, MD  HPI: Martha Carter is a 57 y.o. female seen for follow up of Acute on Chronic Respiratory Failure.  Patient is on weaning protocol currently is on a 16-hour goal of pressure support.  My concern is that she is failed trial of extubation previously we are going to try to see how she does however she will likely end up needing a tracheostomy because of previous failures to be extubated.  Medications: Reviewed on Rounds  Physical Exam:  Vitals: Temperature 98.0 pulse 89 respiratory rate 15 blood pressure 131/67 saturations 97%  Ventilator Settings mode of ventilation pressure support FiO2 28% tidal volume 354 pressure support 12 PEEP 5  . General: Comfortable at this time . Eyes: Grossly normal lids, irises & conjunctiva . ENT: grossly tongue is normal . Neck: no obvious mass . Cardiovascular: S1 S2 normal no gallop . Respiratory: Scattered rhonchi are noted . Abdomen: soft . Skin: no rash seen on limited exam . Musculoskeletal: not rigid . Psychiatric:unable to assess . Neurologic: no seizure no involuntary movements         Lab Data:   Basic Metabolic Panel: Recent Labs  Lab 07/15/17 0618 07/16/17 0651 07/17/17 0705  NA 138 139 143  K 3.3* 4.1 4.2  CL 101 101 100  CO2 24 29 30   GLUCOSE 179* 217* 235*  BUN 32* 34* 38*  CREATININE 1.00 0.98 0.97  CALCIUM 9.9 10.1 10.2  MG 2.1 2.3 2.4  PHOS 3.2 3.2 3.3    Liver Function Tests: Recent Labs  Lab 07/15/17 0618  AST 17  ALT 15  ALKPHOS 69  BILITOT 0.9  PROT 6.9  ALBUMIN 2.7*   No results for input(s): LIPASE, AMYLASE in the last 168 hours. No results for input(s): AMMONIA in the last 168 hours.  CBC: Recent Labs  Lab 07/15/17 0618 07/16/17 0651 07/17/17 0705  WBC  10.2 9.3 9.8  NEUTROABS 8.5*  --   --   HGB 11.1* 10.9* 10.9*  HCT 33.7* 33.4* 33.7*  MCV 89.6 89.8 88.9  PLT 189 202 213    Cardiac Enzymes: No results for input(s): CKTOTAL, CKMB, CKMBINDEX, TROPONINI in the last 168 hours.  BNP (last 3 results) No results for input(s): BNP in the last 8760 hours.  ProBNP (last 3 results) No results for input(s): PROBNP in the last 8760 hours.  Radiological Exams: Dg Abd Portable 1v  Result Date: 07/18/2017 CLINICAL DATA:  Feeding tube placement. EXAM: PORTABLE ABDOMEN - 1 VIEW COMPARISON:  07/14/2017. FINDINGS: Prior CABG. Endotracheal tube noted with its tip 3 cm above the carina. NG tube noted with tip below left hemidiaphragm. Tip is in stable position. No gastric distention. Surgical clips right upper quadrant. IMPRESSION: NG tube noted with tip below left hemidiaphragm. Tip is in stable position. Electronically Signed   By: Maisie Fus  Register   On: 07/18/2017 07:34    Assessment/Plan Active Problems:   Acute on chronic respiratory failure with hypoxia (HCC)   Lobar pneumonia, unspecified organism (HCC)   Coronary artery disease   Chronic atrial fibrillation (HCC)   1. Acute on chronic respiratory failure with hypoxia continue with weaning on protocol as noted above she will likely end up needing to have a tracheostomy done.  This is due to multiple failures at extubation  trials. 2. Lobar pneumonia treated with antibiotics we will continue present management. 3. Coronary artery disease at baseline 4. Chronic atrial fibrillation rate is controlled we will continue to follow   I have personally seen and evaluated the patient, evaluated laboratory and imaging results, formulated the assessment and plan and placed orders. The Patient requires high complexity decision making for assessment and support.  Case was discussed on Rounds with the Respiratory Therapy Staff  Yevonne PaxSaadat A Careena Degraffenreid, MD [facility] Pulmonary Critical Care Medicine Sleep Medicine

## 2017-07-20 DIAGNOSIS — J181 Lobar pneumonia, unspecified organism: Secondary | ICD-10-CM | POA: Diagnosis not present

## 2017-07-20 DIAGNOSIS — I2510 Atherosclerotic heart disease of native coronary artery without angina pectoris: Secondary | ICD-10-CM | POA: Diagnosis not present

## 2017-07-20 DIAGNOSIS — J9621 Acute and chronic respiratory failure with hypoxia: Secondary | ICD-10-CM | POA: Diagnosis not present

## 2017-07-20 DIAGNOSIS — I2583 Coronary atherosclerosis due to lipid rich plaque: Secondary | ICD-10-CM | POA: Diagnosis not present

## 2017-07-20 DIAGNOSIS — I482 Chronic atrial fibrillation: Secondary | ICD-10-CM | POA: Diagnosis not present

## 2017-07-20 LAB — CBC
HEMATOCRIT: 32.1 % — AB (ref 36.0–46.0)
HEMOGLOBIN: 10 g/dL — AB (ref 12.0–15.0)
MCH: 29.2 pg (ref 26.0–34.0)
MCHC: 31.2 g/dL (ref 30.0–36.0)
MCV: 93.9 fL (ref 78.0–100.0)
Platelets: 176 10*3/uL (ref 150–400)
RBC: 3.42 MIL/uL — ABNORMAL LOW (ref 3.87–5.11)
RDW: 15.9 % — ABNORMAL HIGH (ref 11.5–15.5)
WBC: 9.5 10*3/uL (ref 4.0–10.5)

## 2017-07-20 LAB — BASIC METABOLIC PANEL
ANION GAP: 13 (ref 5–15)
BUN: 60 mg/dL — ABNORMAL HIGH (ref 6–20)
CHLORIDE: 101 mmol/L (ref 98–111)
CO2: 30 mmol/L (ref 22–32)
Calcium: 10 mg/dL (ref 8.9–10.3)
Creatinine, Ser: 1.22 mg/dL — ABNORMAL HIGH (ref 0.44–1.00)
GFR calc non Af Amer: 48 mL/min — ABNORMAL LOW (ref >60)
GFR, EST AFRICAN AMERICAN: 56 mL/min — AB (ref >60)
GLUCOSE: 216 mg/dL — AB (ref 70–99)
Potassium: 5.2 mmol/L — ABNORMAL HIGH (ref 3.5–5.1)
Sodium: 144 mmol/L (ref 135–145)

## 2017-07-20 NOTE — Progress Notes (Signed)
Pulmonary Critical Care Medicine [facility] [place]   PULMONARY SERVICE  PROGRESS NOTE  Date of Service: 07/20/2017  Martha Carter  OZH:[id number]  DOB: 31-Oct-1960   DOA: 07/14/2017  Referring Physician: Carron CurieAli Hijazi, MD  HPI: Martha Carter is a [age] y.o. female seen for follow up of Acute on Chronic Respiratory Failure.   Comfortable right now without distress.  Patient is on pressure support mode has been on 28% oxygen.  Good saturations are noted.  Medications: Reviewed on Rounds  Physical Exam:  Vitals:   Temperature 97.5 degrees pulse 82 respiratory 12 blood pressure 130/65 saturations 97%  Ventilator Settings  Mode of ventilation pressure support FiO2 28% tidal volume 346 pressure support 12 peep 5  . General: Comfortable at this time . Eyes: Grossly normal lids, irises & conjunctiva . ENT: grossly tongue is normal . Neck: no obvious mass . Cardiovascular: S1 S2 normal no gallop . Respiratory:  Coarse breath sounds noted bilaterally no rhonchi . Abdomen: soft . Skin: no rash seen on limited exam . Musculoskeletal: not rigid . Psychiatric:unable to assess . Neurologic: no seizure no involuntary movements         Lab Data:   Basic Metabolic Panel: Recent Labs  Lab 07/15/17 0618 07/16/17 0651 07/17/17 0705 07/20/17 0455  NA 138 139 143 144  K 3.3* 4.1 4.2 5.2*  CL 101 101 100 101  CO2 24 29 30 30   GLUCOSE 179* 217* 235* 216*  BUN 32* 34* 38* 60*  CREATININE 1.00 0.98 0.97 1.22*  CALCIUM 9.9 10.1 10.2 10.0  MG 2.1 2.3 2.4  --   PHOS 3.2 3.2 3.3  --     Liver Function Tests: Recent Labs  Lab 07/15/17 0618  AST 17  ALT 15  ALKPHOS 69  BILITOT 0.9  PROT 6.9  ALBUMIN 2.7*   No results for input(s): LIPASE, AMYLASE in the last 168 hours. No results for input(s): AMMONIA in the last 168 hours.  CBC: Recent Labs  Lab 07/15/17 0618 07/16/17 0651 07/17/17 0705 07/20/17 0455  WBC 10.2 9.3 9.8 9.5  NEUTROABS 8.5*  --   --   --    HGB 11.1* 10.9* 10.9* 10.0*  HCT 33.7* 33.4* 33.7* 32.1*  MCV 89.6 89.8 88.9 93.9  PLT 189 202 213 176    Cardiac Enzymes: No results for input(s): CKTOTAL, CKMB, CKMBINDEX, TROPONINI in the last 168 hours.  BNP (last 3 results) No results for input(s): BNP in the last 8760 hours.  ProBNP (last 3 results) No results for input(s): PROBNP in the last 8760 hours.  Radiological Exams: No results found.  Assessment/Plan Active Problems:   Acute on chronic respiratory failure with hypoxia (HCC)   Lobar pneumonia, unspecified organism (HCC)   Coronary artery disease   Chronic atrial fibrillation (HCC)   1.  acute on chronic Respiratory failure with hypoxia patient is doing fairly well on the wean protocol currently goal is 20 hr on pressure support 2. Lobar pneumonia treated with antibiotics we will continue present management.   3. Coronary artery disease at baseline we will follow  4. Chronic atrial fibrillation rate is controlled   I have personally seen and evaluated the patient, evaluated laboratory and imaging results, formulated the assessment and plan and placed orders. The Patient requires high complexity decision making for assessment and support.  Case was discussed on Rounds with the Respiratory Therapy Staff  Yevonne PaxSaadat A Thai Burgueno, MD [facility] Pulmonary Critical Care Medicine Sleep Medicine

## 2017-07-21 DIAGNOSIS — J9621 Acute and chronic respiratory failure with hypoxia: Secondary | ICD-10-CM | POA: Diagnosis not present

## 2017-07-21 DIAGNOSIS — I482 Chronic atrial fibrillation: Secondary | ICD-10-CM | POA: Diagnosis not present

## 2017-07-21 DIAGNOSIS — J181 Lobar pneumonia, unspecified organism: Secondary | ICD-10-CM | POA: Diagnosis not present

## 2017-07-21 DIAGNOSIS — I2510 Atherosclerotic heart disease of native coronary artery without angina pectoris: Secondary | ICD-10-CM | POA: Diagnosis not present

## 2017-07-21 LAB — BASIC METABOLIC PANEL
Anion gap: 10 (ref 5–15)
BUN: 51 mg/dL — AB (ref 6–20)
CO2: 33 mmol/L — ABNORMAL HIGH (ref 22–32)
CREATININE: 1.17 mg/dL — AB (ref 0.44–1.00)
Calcium: 9.8 mg/dL (ref 8.9–10.3)
Chloride: 104 mmol/L (ref 98–111)
GFR calc Af Amer: 59 mL/min — ABNORMAL LOW (ref >60)
GFR calc non Af Amer: 51 mL/min — ABNORMAL LOW (ref >60)
GLUCOSE: 235 mg/dL — AB (ref 70–99)
POTASSIUM: 4.4 mmol/L (ref 3.5–5.1)
SODIUM: 147 mmol/L — AB (ref 135–145)

## 2017-07-21 LAB — MAGNESIUM: MAGNESIUM: 2.8 mg/dL — AB (ref 1.7–2.4)

## 2017-07-21 NOTE — Progress Notes (Signed)
[facility]   PULMONARY SERVICE  PROGRESS NOTE  Date of Service: 07/21/2017  Martha BustleGlenda W Dom  ZOX:[id number]:[id number]  DOB: [birth year]   DOA: 07/14/2017  Referring Physician: Carron CurieAli Hijazi, MD  HPI: Martha Carter is a 57 y.o. female seen for follow up of Acute on Chronic Respiratory Failure.  She continues to wean doing fairly well right now is on pressure support mode she however will end up needing a tracheostomy because of failure to extubate several times.  Currently is on 28% FiO2 on a pressure support of 12 PEEP 5  Medications: Reviewed on Rounds  Physical Exam:  Vitals: Temperature 98.3 pulse 78 respiratory rate 21 blood pressure 149/76 saturations 98%  Ventilator Settings mode of ventilation pressure support FiO2 28% tidal volume 350 pressure support 12 PEEP 5  . General: Comfortable at this time . Eyes: Grossly normal lids, irises & conjunctiva . ENT: grossly tongue is normal . Neck: no obvious mass . Cardiovascular: S1 S2 normal no gallop . Respiratory: No rhonchi or rales are noted . Abdomen: soft . Skin: no rash seen on limited exam . Musculoskeletal: not rigid . Psychiatric:unable to assess . Neurologic: no seizure no involuntary movements         Lab Data:   Basic Metabolic Panel: Recent Labs  Lab 07/15/17 0618 07/16/17 0651 07/17/17 0705 07/20/17 0455 07/21/17 0636  NA 138 139 143 144 147*  K 3.3* 4.1 4.2 5.2* 4.4  CL 101 101 100 101 104  CO2 24 29 30 30  33*  GLUCOSE 179* 217* 235* 216* 235*  BUN 32* 34* 38* 60* 51*  CREATININE 1.00 0.98 0.97 1.22* 1.17*  CALCIUM 9.9 10.1 10.2 10.0 9.8  MG 2.1 2.3 2.4  --  2.8*  PHOS 3.2 3.2 3.3  --   --     Liver Function Tests: Recent Labs  Lab 07/15/17 0618  AST 17  ALT 15  ALKPHOS 69  BILITOT 0.9  PROT 6.9  ALBUMIN 2.7*   No results for input(s): LIPASE, AMYLASE in the last 168 hours. No results for input(s): AMMONIA in the last 168  hours.  CBC: Recent Labs  Lab 07/15/17 0618 07/16/17 0651 07/17/17 0705 07/20/17 0455  WBC 10.2 9.3 9.8 9.5  NEUTROABS 8.5*  --   --   --   HGB 11.1* 10.9* 10.9* 10.0*  HCT 33.7* 33.4* 33.7* 32.1*  MCV 89.6 89.8 88.9 93.9  PLT 189 202 213 176    Cardiac Enzymes: No results for input(s): CKTOTAL, CKMB, CKMBINDEX, TROPONINI in the last 168 hours.  BNP (last 3 results) No results for input(s): BNP in the last 8760 hours.  ProBNP (last 3 results) No results for input(s): PROBNP in the last 8760 hours.  Radiological Exams: No results found.  Assessment/Plan Active Problems:   Acute on chronic respiratory failure with hypoxia (HCC)   Lobar pneumonia, unspecified organism (HCC)   Coronary artery disease   Chronic atrial fibrillation (HCC)   1. Acute on chronic respiratory failure with hypoxia patient is on pressure support at this time we will continue to wean.  Continue pulmonary toilet supportive care. 2. Lobar pneumonia treated with antibiotics slowly improving 3. Coronary artery disease stable 4. Chronic atrial fibrillation rate is controlled we will continue to monitor   I have personally seen and evaluated the patient, evaluated laboratory and imaging results, formulated the assessment and plan and placed orders. The Patient requires high complexity decision making for assessment and support.  Case was discussed on Rounds with the Respiratory Therapy Staff  Allyne Gee, MD [facility] Pulmonary Critical Care Medicine Sleep Medicine

## 2017-07-22 DIAGNOSIS — I2510 Atherosclerotic heart disease of native coronary artery without angina pectoris: Secondary | ICD-10-CM | POA: Diagnosis not present

## 2017-07-22 DIAGNOSIS — I482 Chronic atrial fibrillation: Secondary | ICD-10-CM | POA: Diagnosis not present

## 2017-07-22 DIAGNOSIS — J181 Lobar pneumonia, unspecified organism: Secondary | ICD-10-CM | POA: Diagnosis not present

## 2017-07-22 DIAGNOSIS — J9621 Acute and chronic respiratory failure with hypoxia: Secondary | ICD-10-CM | POA: Diagnosis not present

## 2017-07-22 LAB — URINALYSIS, ROUTINE W REFLEX MICROSCOPIC
BACTERIA UA: NONE SEEN
Bilirubin Urine: NEGATIVE
Glucose, UA: NEGATIVE mg/dL
Hgb urine dipstick: NEGATIVE
Ketones, ur: NEGATIVE mg/dL
Leukocytes, UA: NEGATIVE
Nitrite: NEGATIVE
PROTEIN: 100 mg/dL — AB
SPECIFIC GRAVITY, URINE: 1.021 (ref 1.005–1.030)
pH: 6 (ref 5.0–8.0)

## 2017-07-22 NOTE — Progress Notes (Signed)
[facility]   PULMONARY SERVICE  PROGRESS NOTE  Date of Service: 07/22/2017  Martha BustleGlenda W Friebel  ZOX:[id number]:[id number]  DOB: 1960/03/14   DOA: 07/14/2017  Referring Physician: Carron CurieAli Hijazi, MD  HPI: Martha Carter is a 57 y.o. female seen for follow up of Acute on Chronic Respiratory Failure.  Patient right now is on assist control has been on 28% FiO2 good saturations are noted with a PEEP of 5  Medications: Reviewed on Rounds  Physical Exam:  Vitals: Temperature 99.2 pulse 76 respiratory rate 15 blood pressure 120/64 saturations 99%  Ventilator Settings on assist control FiO2 20% tidal volume 395 PEEP 5  . General: Comfortable at this time . Eyes: Grossly normal lids, irises & conjunctiva . ENT: grossly tongue is normal . Neck: no obvious mass . Cardiovascular: S1 S2 normal no gallop . Respiratory: No rhonchi rales . Abdomen: soft . Skin: no rash seen on limited exam . Musculoskeletal: not rigid . Psychiatric:unable to assess . Neurologic: no seizure no involuntary movements         Lab Data:   Basic Metabolic Panel: Recent Labs  Lab 07/16/17 0651 07/17/17 0705 07/20/17 0455 07/21/17 0636  NA 139 143 144 147*  K 4.1 4.2 5.2* 4.4  CL 101 100 101 104  CO2 29 30 30  33*  GLUCOSE 217* 235* 216* 235*  BUN 34* 38* 60* 51*  CREATININE 0.98 0.97 1.22* 1.17*  CALCIUM 10.1 10.2 10.0 9.8  MG 2.3 2.4  --  2.8*  PHOS 3.2 3.3  --   --     Liver Function Tests: No results for input(s): AST, ALT, ALKPHOS, BILITOT, PROT, ALBUMIN in the last 168 hours. No results for input(s): LIPASE, AMYLASE in the last 168 hours. No results for input(s): AMMONIA in the last 168 hours.  CBC: Recent Labs  Lab 07/16/17 0651 07/17/17 0705 07/20/17 0455  WBC 9.3 9.8 9.5  HGB 10.9* 10.9* 10.0*  HCT 33.4* 33.7* 32.1*  MCV 89.8 88.9 93.9  PLT 202 213 176    Cardiac Enzymes: No results for input(s): CKTOTAL, CKMB, CKMBINDEX, TROPONINI  in the last 168 hours.  BNP (last 3 results) No results for input(s): BNP in the last 8760 hours.  ProBNP (last 3 results) No results for input(s): PROBNP in the last 8760 hours.  Radiological Exams: No results found.  Assessment/Plan Active Problems:   Acute on chronic respiratory failure with hypoxia (HCC)   Lobar pneumonia, unspecified organism (HCC)   Coronary artery disease   Chronic atrial fibrillation (HCC)   1. Acute on chronic respiratory failure with hypoxia we will continue with full supportive care.  She is going to need trach 2. Lobar pneumonia treated with antibiotics continue with supportive care 3. Coronary artery disease stable 4. Chronic atrial fibrillation rate is controlled   I have personally seen and evaluated the patient, evaluated laboratory and imaging results, formulated the assessment and plan and placed orders. The Patient requires high complexity decision making for assessment and support.  Case was discussed on Rounds with the Respiratory Therapy Staff  Yevonne PaxSaadat A Khan, MD [facility] Pulmonary Critical Care Medicine Sleep Medicine

## 2017-07-23 DIAGNOSIS — I482 Chronic atrial fibrillation: Secondary | ICD-10-CM | POA: Diagnosis not present

## 2017-07-23 DIAGNOSIS — J181 Lobar pneumonia, unspecified organism: Secondary | ICD-10-CM | POA: Diagnosis not present

## 2017-07-23 DIAGNOSIS — J9621 Acute and chronic respiratory failure with hypoxia: Secondary | ICD-10-CM | POA: Diagnosis not present

## 2017-07-23 DIAGNOSIS — I2510 Atherosclerotic heart disease of native coronary artery without angina pectoris: Secondary | ICD-10-CM | POA: Diagnosis not present

## 2017-07-23 LAB — CBC
HCT: 29.6 % — ABNORMAL LOW (ref 36.0–46.0)
HEMOGLOBIN: 9.3 g/dL — AB (ref 12.0–15.0)
MCH: 29.1 pg (ref 26.0–34.0)
MCHC: 31.4 g/dL (ref 30.0–36.0)
MCV: 92.5 fL (ref 78.0–100.0)
Platelets: 176 10*3/uL (ref 150–400)
RBC: 3.2 MIL/uL — ABNORMAL LOW (ref 3.87–5.11)
RDW: 16.2 % — ABNORMAL HIGH (ref 11.5–15.5)
WBC: 7.8 10*3/uL (ref 4.0–10.5)

## 2017-07-23 LAB — BASIC METABOLIC PANEL
Anion gap: 10 (ref 5–15)
BUN: 37 mg/dL — AB (ref 6–20)
CO2: 28 mmol/L (ref 22–32)
CREATININE: 1.13 mg/dL — AB (ref 0.44–1.00)
Calcium: 9.9 mg/dL (ref 8.9–10.3)
Chloride: 105 mmol/L (ref 98–111)
GFR calc Af Amer: >60 mL/min (ref >60)
GFR calc non Af Amer: 53 mL/min — ABNORMAL LOW (ref >60)
GLUCOSE: 175 mg/dL — AB (ref 70–99)
Potassium: 4.3 mmol/L (ref 3.5–5.1)
Sodium: 143 mmol/L (ref 135–145)

## 2017-07-23 LAB — URINE CULTURE: CULTURE: NO GROWTH

## 2017-07-23 LAB — MAGNESIUM: Magnesium: 2.8 mg/dL — ABNORMAL HIGH (ref 1.7–2.4)

## 2017-07-23 NOTE — Progress Notes (Signed)
[facility]   PULMONARY SERVICE  PROGRESS NOTE  Date of Service: 07/23/2017  Laurina BustleGlenda W Fredell  WUJ:[id number]:[id number]  DOB: 05-21-1960   DOA: 07/14/2017  Referring Physician: Carron CurieAli Hijazi, MD  HPI: Laurina BustleGlenda W Avedisian is a [age] y.o. female seen for follow up of Acute on Chronic Respiratory Failure.  She was not able to tolerate any weaning remains on the ventilator on ET tube.  Patient is also now sedated has been on to prevent.  Patient is requiring more intense monitoring at this stage because of the prevent.  She is obviously not able to wean also because she is sedated.  The endotracheal tube is in place we are waiting for her to have a tracheostomy  Medications: Reviewed on Rounds  Physical Exam:  Vitals: Temperature 98.6 pulse 87 respiratory rate 20 blood pressure 131/66 saturations 97%  Ventilator Settings mode of ventilation assist control FiO2 28% tidal volume 442 PEEP 5  . General: Comfortable at this time . Eyes: Grossly normal lids, irises & conjunctiva . ENT: grossly tongue is normal . Neck: no obvious mass . Cardiovascular: S1 S2 normal no gallop . Respiratory: No rhonchi or rales . Abdomen: soft . Skin: no rash seen on limited exam . Musculoskeletal: not rigid . Psychiatric:unable to assess . Neurologic: no seizure no involuntary movements         Lab Data:   Basic Metabolic Panel: Recent Labs  Lab 07/17/17 0705 07/20/17 0455 07/21/17 0636 07/23/17 0741  NA 143 144 147* 143  K 4.2 5.2* 4.4 4.3  CL 100 101 104 105  CO2 30 30 33* 28  GLUCOSE 235* 216* 235* 175*  BUN 38* 60* 51* 37*  CREATININE 0.97 1.22* 1.17* 1.13*  CALCIUM 10.2 10.0 9.8 9.9  MG 2.4  --  2.8* 2.8*  PHOS 3.3  --   --   --     Liver Function Tests: No results for input(s): AST, ALT, ALKPHOS, BILITOT, PROT, ALBUMIN in the last 168 hours. No results for input(s): LIPASE, AMYLASE in the last 168 hours. No results for input(s): AMMONIA in the  last 168 hours.  CBC: Recent Labs  Lab 07/17/17 0705 07/20/17 0455 07/23/17 0741  WBC 9.8 9.5 7.8  HGB 10.9* 10.0* 9.3*  HCT 33.7* 32.1* 29.6*  MCV 88.9 93.9 92.5  PLT 213 176 176    Cardiac Enzymes: No results for input(s): CKTOTAL, CKMB, CKMBINDEX, TROPONINI in the last 168 hours.  BNP (last 3 results) No results for input(s): BNP in the last 8760 hours.  ProBNP (last 3 results) No results for input(s): PROBNP in the last 8760 hours.  Radiological Exams: No results found.  Assessment/Plan Active Problems:   Acute on chronic respiratory failure with hypoxia (HCC)   Lobar pneumonia, unspecified organism (HCC)   Coronary artery disease   Chronic atrial fibrillation (HCC)   1. Acute on chronic respiratory failure with hypoxia back on the ventilator and full support patient's not able to wean with the ET tube in place has not been tolerating pressure support mode.  We will continue with aggressive pulmonary toilet titrate oxygen as tolerated. 2. Lobar pneumonia treated with antibiotics we will continue with present management. 3. Chronic atrial fibrillation rate is controlled we will monitor 4. Coronary artery disease stable at baseline 5. Sedation requirements patient is on propofol for advanced sedation patient is requiring advanced monitoring because of the propofol   I have personally seen and evaluated the patient, evaluated laboratory and  imaging results, formulated the assessment and plan and placed orders. The Patient requires high complexity decision making for assessment and support.  Case was discussed on Rounds with the Respiratory Therapy Staff time 35 minutes patient is critically ill in danger of cardiac arrest  Yevonne Pax, MD [facility] Pulmonary Critical Care Medicine Sleep Medicine

## 2017-07-24 DIAGNOSIS — I2583 Coronary atherosclerosis due to lipid rich plaque: Secondary | ICD-10-CM | POA: Diagnosis not present

## 2017-07-24 DIAGNOSIS — J9621 Acute and chronic respiratory failure with hypoxia: Secondary | ICD-10-CM | POA: Diagnosis not present

## 2017-07-24 DIAGNOSIS — I2510 Atherosclerotic heart disease of native coronary artery without angina pectoris: Secondary | ICD-10-CM | POA: Diagnosis not present

## 2017-07-24 DIAGNOSIS — I482 Chronic atrial fibrillation: Secondary | ICD-10-CM | POA: Diagnosis not present

## 2017-07-24 DIAGNOSIS — J181 Lobar pneumonia, unspecified organism: Secondary | ICD-10-CM | POA: Diagnosis not present

## 2017-07-24 NOTE — Progress Notes (Signed)
[facility]   PULMONARY SERVICE  PROGRESS NOTE  Date of Service: 07/24/2017  Laurina BustleGlenda W Eimer  ZOX:[id number]:[id number]  DOB: 11-19-1960   DOA: 07/14/2017  Referring Physician: Carron CurieAli Hijazi, MD  HPI: Laurina BustleGlenda W Olenik is a 57 y.o. female seen for follow up of Acute on Chronic Respiratory Failure.  Patient is on pressure support still has the ET tube in place waiting for tracheostomy.  She is still requiring propofol.  She is sedated however he still is able to move around.  Discussed on rounds possibility of Precedex if were not able to get the ET tube within the next few days.  Medications: Reviewed on Rounds  Physical Exam:  Vitals: Temperature 98.2 pulse 84 respiratory 23 blood pressure 130/65 saturations 98%  Ventilator Settings mode of ventilation pressure support FiO2 28% per support 12 PEEP 5  . General: Comfortable at this time . Eyes: Grossly normal lids, irises & conjunctiva . ENT: grossly tongue is normal . Neck: no obvious mass . Cardiovascular: S1 S2 normal no gallop . Respiratory: No rhonchi or rales . Abdomen: soft . Skin: no rash seen on limited exam . Musculoskeletal: not rigid . Psychiatric:unable to assess . Neurologic: no seizure no involuntary movements         Lab Data:   Basic Metabolic Panel: Recent Labs  Lab 07/20/17 0455 07/21/17 0636 07/23/17 0741  NA 144 147* 143  K 5.2* 4.4 4.3  CL 101 104 105  CO2 30 33* 28  GLUCOSE 216* 235* 175*  BUN 60* 51* 37*  CREATININE 1.22* 1.17* 1.13*  CALCIUM 10.0 9.8 9.9  MG  --  2.8* 2.8*    Liver Function Tests: No results for input(s): AST, ALT, ALKPHOS, BILITOT, PROT, ALBUMIN in the last 168 hours. No results for input(s): LIPASE, AMYLASE in the last 168 hours. No results for input(s): AMMONIA in the last 168 hours.  CBC: Recent Labs  Lab 07/20/17 0455 07/23/17 0741  WBC 9.5 7.8  HGB 10.0* 9.3*  HCT 32.1* 29.6*  MCV 93.9 92.5  PLT 176 176     Cardiac Enzymes: No results for input(s): CKTOTAL, CKMB, CKMBINDEX, TROPONINI in the last 168 hours.  BNP (last 3 results) No results for input(s): BNP in the last 8760 hours.  ProBNP (last 3 results) No results for input(s): PROBNP in the last 8760 hours.  Radiological Exams: No results found.  Assessment/Plan Active Problems:   Acute on chronic respiratory failure with hypoxia (HCC)   Lobar pneumonia, unspecified organism (HCC)   Coronary artery disease   Chronic atrial fibrillation (HCC)   1. Acute on chronic respiratory failure with hypoxia we will continue with pressure support mode titrate oxygen as tolerated continue pulmonary toilet 2. Lobar pneumonia treated with antibiotics 3. Coronary artery disease at baseline 4. Chronic atrial fibrillation rate is controlled   I have personally seen and evaluated the patient, evaluated laboratory and imaging results, formulated the assessment and plan and placed orders. The Patient requires high complexity decision making for assessment and support.  Case was discussed on Rounds with the Respiratory Therapy Staff  Yevonne PaxSaadat A Khan, MD [facility] Pulmonary Critical Care Medicine Sleep Medicine

## 2017-07-25 ENCOUNTER — Encounter: Payer: Self-pay | Admitting: Certified Registered"

## 2017-07-25 ENCOUNTER — Encounter (HOSPITAL_COMMUNITY): Payer: Medicare Other | Admitting: Certified Registered Nurse Anesthetist

## 2017-07-25 ENCOUNTER — Other Ambulatory Visit (HOSPITAL_COMMUNITY): Payer: Self-pay

## 2017-07-25 ENCOUNTER — Encounter (HOSPITAL_COMMUNITY): Payer: Self-pay | Admitting: Certified Registered"

## 2017-07-25 ENCOUNTER — Encounter (HOSPITAL_COMMUNITY)
Admission: RE | Disposition: A | Discharge status: Nursing Home (Includes ICF) | Payer: Self-pay | Source: Ambulatory Visit | Attending: Internal Medicine

## 2017-07-25 ENCOUNTER — Inpatient Hospital Stay
Admission: AD | Admit: 2017-07-25 | Discharge: 2017-08-09 | Disposition: A | Discharge status: Still In Hospital | Payer: Medicare Other | Source: Ambulatory Visit | Attending: Internal Medicine | Admitting: Internal Medicine

## 2017-07-25 DIAGNOSIS — I2510 Atherosclerotic heart disease of native coronary artery without angina pectoris: Secondary | ICD-10-CM | POA: Diagnosis present

## 2017-07-25 DIAGNOSIS — Z4659 Encounter for fitting and adjustment of other gastrointestinal appliance and device: Secondary | ICD-10-CM

## 2017-07-25 DIAGNOSIS — I480 Paroxysmal atrial fibrillation: Secondary | ICD-10-CM | POA: Diagnosis present

## 2017-07-25 DIAGNOSIS — Z931 Gastrostomy status: Secondary | ICD-10-CM

## 2017-07-25 DIAGNOSIS — I482 Chronic atrial fibrillation, unspecified: Secondary | ICD-10-CM | POA: Diagnosis present

## 2017-07-25 DIAGNOSIS — J181 Lobar pneumonia, unspecified organism: Secondary | ICD-10-CM | POA: Diagnosis present

## 2017-07-25 DIAGNOSIS — J9621 Acute and chronic respiratory failure with hypoxia: Secondary | ICD-10-CM | POA: Diagnosis present

## 2017-07-25 HISTORY — PX: TRACHEOSTOMY TUBE PLACEMENT: SHX814

## 2017-07-25 LAB — CBC
HCT: 26 % — ABNORMAL LOW (ref 36.0–46.0)
Hemoglobin: 8.2 g/dL — ABNORMAL LOW (ref 12.0–15.0)
MCH: 29.2 pg (ref 26.0–34.0)
MCHC: 31.5 g/dL (ref 30.0–36.0)
MCV: 92.5 fL (ref 78.0–100.0)
PLATELETS: 180 10*3/uL (ref 150–400)
RBC: 2.81 MIL/uL — ABNORMAL LOW (ref 3.87–5.11)
RDW: 16.6 % — ABNORMAL HIGH (ref 11.5–15.5)
WBC: 7.2 10*3/uL (ref 4.0–10.5)

## 2017-07-25 LAB — BASIC METABOLIC PANEL
Anion gap: 9 (ref 5–15)
BUN: 37 mg/dL — AB (ref 6–20)
CO2: 29 mmol/L (ref 22–32)
Calcium: 9.8 mg/dL (ref 8.9–10.3)
Chloride: 104 mmol/L (ref 98–111)
Creatinine, Ser: 1.14 mg/dL — ABNORMAL HIGH (ref 0.44–1.00)
GFR calc Af Amer: >60 mL/min (ref >60)
GFR, EST NON AFRICAN AMERICAN: 52 mL/min — AB (ref >60)
Glucose, Bld: 128 mg/dL — ABNORMAL HIGH (ref 70–99)
Potassium: 4.3 mmol/L (ref 3.5–5.1)
SODIUM: 142 mmol/L (ref 135–145)

## 2017-07-25 LAB — MAGNESIUM: MAGNESIUM: 2.8 mg/dL — AB (ref 1.7–2.4)

## 2017-07-25 LAB — PHOSPHORUS: Phosphorus: 4.3 mg/dL (ref 2.5–4.6)

## 2017-07-25 SURGERY — CREATION, TRACHEOSTOMY
Anesthesia: General

## 2017-07-25 SURGERY — CREATION, TRACHEOSTOMY
Anesthesia: General | Site: Neck

## 2017-07-25 MED ORDER — MIDAZOLAM HCL 2 MG/2ML IJ SOLN
INTRAMUSCULAR | Status: AC
  Filled 2017-07-25: qty 2

## 2017-07-25 MED ORDER — ONDANSETRON HCL 4 MG/2ML IJ SOLN
INTRAMUSCULAR | Status: AC
  Filled 2017-07-25: qty 2

## 2017-07-25 MED ORDER — FENTANYL CITRATE (PF) 250 MCG/5ML IJ SOLN
INTRAMUSCULAR | Status: AC
  Filled 2017-07-25: qty 5

## 2017-07-25 MED ORDER — DEXAMETHASONE SODIUM PHOSPHATE 10 MG/ML IJ SOLN
INTRAMUSCULAR | Status: AC
  Filled 2017-07-25: qty 1

## 2017-07-25 MED ORDER — ROCURONIUM BROMIDE 10 MG/ML (PF) SYRINGE
PREFILLED_SYRINGE | INTRAVENOUS | Status: DC | PRN
  Administered 2017-07-25: 40 mg via INTRAVENOUS

## 2017-07-25 MED ORDER — MIDAZOLAM HCL 5 MG/5ML IJ SOLN
INTRAMUSCULAR | Status: DC | PRN
  Administered 2017-07-25: 2 mg via INTRAVENOUS

## 2017-07-25 MED ORDER — FENTANYL CITRATE (PF) 250 MCG/5ML IJ SOLN
INTRAMUSCULAR | Status: DC | PRN
  Administered 2017-07-25: 50 ug via INTRAVENOUS

## 2017-07-25 MED ORDER — PROPOFOL 10 MG/ML IV BOLUS
INTRAVENOUS | Status: AC
  Filled 2017-07-25: qty 20

## 2017-07-25 MED ORDER — PHENYLEPHRINE 40 MCG/ML (10ML) SYRINGE FOR IV PUSH (FOR BLOOD PRESSURE SUPPORT)
PREFILLED_SYRINGE | INTRAVENOUS | Status: AC
  Filled 2017-07-25: qty 10

## 2017-07-25 MED ORDER — LACTATED RINGERS IV SOLN
INTRAVENOUS | Status: DC | PRN
  Administered 2017-07-25: 13:00:00 via INTRAVENOUS

## 2017-07-25 MED ORDER — PHENYLEPHRINE 40 MCG/ML (10ML) SYRINGE FOR IV PUSH (FOR BLOOD PRESSURE SUPPORT)
PREFILLED_SYRINGE | INTRAVENOUS | Status: DC | PRN
  Administered 2017-07-25 (×4): 80 ug via INTRAVENOUS

## 2017-07-25 MED ORDER — LIDOCAINE-EPINEPHRINE 1 %-1:100000 IJ SOLN
INTRAMUSCULAR | Status: DC | PRN
  Administered 2017-07-25: 30 mL

## 2017-07-25 MED ORDER — 0.9 % SODIUM CHLORIDE (POUR BTL) OPTIME
TOPICAL | Status: DC | PRN
  Administered 2017-07-25: 1000 mL

## 2017-07-25 MED ORDER — PROPOFOL 10 MG/ML IV BOLUS
INTRAVENOUS | Status: DC | PRN
  Administered 2017-07-25 (×2): 20 mg via INTRAVENOUS
  Administered 2017-07-25: 50 mg via INTRAVENOUS
  Administered 2017-07-25: 20 mg via INTRAVENOUS

## 2017-07-25 MED ORDER — ONDANSETRON HCL 4 MG/2ML IJ SOLN
INTRAMUSCULAR | Status: DC | PRN
  Administered 2017-07-25: 4 mg via INTRAVENOUS

## 2017-07-25 MED ORDER — STERILE WATER FOR IRRIGATION IR SOLN
Status: DC | PRN
  Administered 2017-07-25: 1000 mL

## 2017-07-25 SURGICAL SUPPLY — 37 items
ATTRACTOMAT 16X20 MAGNETIC DRP (DRAPES) IMPLANT
BLADE SURG 15 STRL LF DISP TIS (BLADE) ×1 IMPLANT
BLADE SURG 15 STRL SS (BLADE) ×1
CLEANER TIP ELECTROSURG 2X2 (MISCELLANEOUS) ×2 IMPLANT
COVER SURGICAL LIGHT HANDLE (MISCELLANEOUS) ×2 IMPLANT
DRAPE HALF SHEET 40X57 (DRAPES) IMPLANT
ELECT COATED BLADE 2.86 ST (ELECTRODE) ×2 IMPLANT
ELECT REM PT RETURN 9FT ADLT (ELECTROSURGICAL) ×2
ELECTRODE REM PT RTRN 9FT ADLT (ELECTROSURGICAL) ×1 IMPLANT
GAUZE SPONGE 4X4 16PLY XRAY LF (GAUZE/BANDAGES/DRESSINGS) ×2 IMPLANT
GEL ULTRASOUND 20GR AQUASONIC (MISCELLANEOUS) ×2 IMPLANT
GLOVE SS BIOGEL STRL SZ 7.5 (GLOVE) ×1 IMPLANT
GLOVE SUPERSENSE BIOGEL SZ 7.5 (GLOVE) ×1
GOWN STRL REUS W/ TWL LRG LVL3 (GOWN DISPOSABLE) ×1 IMPLANT
GOWN STRL REUS W/ TWL XL LVL3 (GOWN DISPOSABLE) ×1 IMPLANT
GOWN STRL REUS W/TWL LRG LVL3 (GOWN DISPOSABLE) ×1
GOWN STRL REUS W/TWL XL LVL3 (GOWN DISPOSABLE) ×1
HOLDER TRACH TUBE VELCRO 19.5 (MISCELLANEOUS) ×2 IMPLANT
KIT BASIN OR (CUSTOM PROCEDURE TRAY) ×2 IMPLANT
KIT SUCTION CATH 14FR (SUCTIONS) ×2 IMPLANT
KIT TURNOVER KIT B (KITS) ×2 IMPLANT
NEEDLE HYPO 25GX1X1/2 BEV (NEEDLE) ×2 IMPLANT
NS IRRIG 1000ML POUR BTL (IV SOLUTION) ×2 IMPLANT
PACK EENT II TURBAN DRAPE (CUSTOM PROCEDURE TRAY) ×2 IMPLANT
PAD ARMBOARD 7.5X6 YLW CONV (MISCELLANEOUS) IMPLANT
PENCIL BUTTON HOLSTER BLD 10FT (ELECTRODE) ×2 IMPLANT
SPONGE DRAIN TRACH 4X4 STRL 2S (GAUZE/BANDAGES/DRESSINGS) ×2 IMPLANT
SPONGE INTESTINAL PEANUT (DISPOSABLE) ×2 IMPLANT
SUT SILK 2 0 SH CR/8 (SUTURE) ×2 IMPLANT
SUT SILK 3 0 TIES 10X30 (SUTURE) IMPLANT
SYR 5ML LUER SLIP (SYRINGE) ×2 IMPLANT
SYR CONTROL 10ML LL (SYRINGE) ×2 IMPLANT
TOWEL OR 17X24 6PK STRL BLUE (TOWEL DISPOSABLE) ×2 IMPLANT
TOWEL OR 17X26 10 PK STRL BLUE (TOWEL DISPOSABLE) ×2 IMPLANT
TUBE CONNECTING 12X1/4 (SUCTIONS) ×2 IMPLANT
TUBE TRACH SHILEY  6 DIST  CUF (TUBING) IMPLANT
TUBE TRACH SHILEY 8 DIST CUF (TUBING) IMPLANT

## 2017-07-25 NOTE — Transfer of Care (Signed)
Immediate Anesthesia Transfer of Care Note  Patient: Janann AugustGlenda W Mcenroe  Procedure(s) Performed: TRACHEOSTOMY (N/A Neck)  Patient Location: Nursing Unit  Anesthesia Type:General  Level of Consciousness: sedated and Patient remains intubated per anesthesia plan  Airway & Oxygen Therapy: Patient remains intubated per anesthesia plan and Patient placed on Ventilator (see vital sign flow sheet for setting)  Post-op Assessment: Report given to RN and Post -op Vital signs reviewed and stable  Post vital signs: Reviewed and stable  Last Vitals:  Vitals Value Taken Time  BP    Temp    Pulse    Resp    SpO2      Last Pain: There were no vitals filed for this visit.       Complications: No apparent anesthesia complications

## 2017-07-25 NOTE — Progress Notes (Signed)
[facility]   PULMONARY SERVICE  PROGRESS NOTE  Date of Service: 07/25/2017  Martha Carter  ZOX:[id number]  DOB: [birth year]   DOA: 07/14/2017  Referring Physician: Carron CurieAli Hijazi, MD  HPI: Martha BustleGlenda W Dumm is a 57 y.o. female seen for follow up of Acute on Chronic Respiratory Failure.  Patient is on pressure support scheduled for tracheostomy today.  She remains on the propofol  Medications: Reviewed on Rounds  Physical Exam:  Vitals: Temperature 97.5 pulse 83 respiratory rate 18 blood pressure 117/56 saturations 99%  Ventilator Settings mode of ventilation pressure support FiO2 28% tidal volume 369 per support 12 PEEP 5  . General: Comfortable at this time . Eyes: Grossly normal lids, irises & conjunctiva . ENT: grossly tongue is normal . Neck: no obvious mass . Cardiovascular: S1 S2 normal no gallop . Respiratory: No rhonchi or rales . Abdomen: soft . Skin: no rash seen on limited exam . Musculoskeletal: not rigid . Psychiatric:unable to assess . Neurologic: no seizure no involuntary movements         Lab Data:   Basic Metabolic Panel: Recent Labs  Lab 07/20/17 0455 07/21/17 0636 07/23/17 0741 07/25/17 0705  NA 144 147* 143 142  K 5.2* 4.4 4.3 4.3  CL 101 104 105 104  CO2 30 33* 28 29  GLUCOSE 216* 235* 175* 128*  BUN 60* 51* 37* 37*  CREATININE 1.22* 1.17* 1.13* 1.14*  CALCIUM 10.0 9.8 9.9 9.8  MG  --  2.8* 2.8* 2.8*  PHOS  --   --   --  4.3    Liver Function Tests: No results for input(s): AST, ALT, ALKPHOS, BILITOT, PROT, ALBUMIN in the last 168 hours. No results for input(s): LIPASE, AMYLASE in the last 168 hours. No results for input(s): AMMONIA in the last 168 hours.  CBC: Recent Labs  Lab 07/20/17 0455 07/23/17 0741 07/25/17 0705  WBC 9.5 7.8 7.2  HGB 10.0* 9.3* 8.2*  HCT 32.1* 29.6* 26.0*  MCV 93.9 92.5 92.5  PLT 176 176 180    Cardiac Enzymes: No results for input(s): CKTOTAL,  CKMB, CKMBINDEX, TROPONINI in the last 168 hours.  BNP (last 3 results) No results for input(s): BNP in the last 8760 hours.  ProBNP (last 3 results) No results for input(s): PROBNP in the last 8760 hours.  Radiological Exams: No results found.  Assessment/Plan Active Problems:   Acute on chronic respiratory failure with hypoxia (HCC)   Lobar pneumonia, unspecified organism (HCC)   Coronary artery disease   Chronic atrial fibrillation (HCC)   1. Acute on chronic respiratory failure with hypoxia we will continue with weaning on pressure support however she is going to have a tracheostomy done today.  Once this is done maybe tomorrow I think we should be able to start weaning more aggressively on T collar. 2. Lobar pneumonia treated we will continue to follow 3. Coronary artery disease at baseline 4. Chronic atrial fibrillation rate is controlled we will continue to monitor   I have personally seen and evaluated the patient, evaluated laboratory and imaging results, formulated the assessment and plan and placed orders. The Patient requires high complexity decision making for assessment and support.  Case was discussed on Rounds with the Respiratory Therapy Staff  Yevonne PaxSaadat A Sylina Henion, MD [facility] Pulmonary Critical Care Medicine Sleep Medicine

## 2017-07-25 NOTE — H&P (Signed)
PREOPERATIVE H&P  Chief Complaint: Acute on chronic respiratory failure  HPI: Martha Carter is a 57 y.o. female who presents for evaluation of chronic respiratory failure on chronic vent.  Consulted concerning placement of tracheotomy.  Patient has a history of medical problems with peripheral vascular disease as well as coronary artery disease and has a chronic wound on her right heel.  She had a previous CABG in 2019.  She was recently admitted and developed  Pseudomonas pneumonia requiring intubation and IV Zosyn.  Attempts of extubation have been unsuccessful.  [facility] and on vent.  Attempts at weaning have been unsuccessful and tracheotomy was recommended.  She has also had history of GI bleed and anemia.  History of A. fib with rate controlled on beta-blocker.  She is taken to the operating room at this time for tracheotomy.  Past Medical History:  Diagnosis Date  . Acute on chronic respiratory failure with hypoxia (HCC)   . Chronic anemia   . Chronic atrial fibrillation (HCC)   . Chronic pain syndrome   . Coronary artery disease   . 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 GI bleed   . Lobar pneumonia, unspecified organism (HCC)   . Peripheral arterial disease [facility])    Past Surgical History:  Procedure Laterality Date  . ANGIOPLASTY    . CHOLECYSTECTOMY    . COLONOSCOPY    . CORONARY ARTERY BYPASS GRAFT    . HYSTERECTOMY ABDOMINAL WITH SALPINGECTOMY    . KNEE SURGERY    . TONSILLECTOMY     Social History   Socioeconomic History  . Marital status: Divorced    Spouse name: Not on file  . Number of children: Not on file  . Years of education: Not on file  . Highest education level: Not on file  Occupational History  . Not on file  Social Needs  . Financial resource strain: Not on file  . Food insecurity:    Worry: Not on file    Inability: Not on file  .  Transportation needs:    Medical: Not on file    Non-medical: Not on file  Tobacco Use  . Smoking status: Former Games developer  . Smokeless tobacco: Never Used  Substance and Sexual Activity  . Alcohol use: Not Currently  . Drug use: Not Currently  . Sexual activity: Not Currently  Lifestyle  . Physical activity:    Days per week: Not on file    Minutes per session: Not on file  . Stress: Not on file  Relationships  . Social connections:    Talks on phone: Not on file    Gets together: Not on file    Attends religious service: Not on file    Active member of club or organization: Not on file    Attends meetings of clubs or organizations: Not on file    Relationship status: Not on file  Other Topics Concern  . Not on file  Social History Narrative  . Not on file   Family History  Family history unknown: Yes   Not on File Prior to Admission medications   Not on File     Positive ROS: neg  All other systems have been reviewed and were otherwise negative with the exception of those mentioned in the HPI and as above.  Physical Exam: There were no vitals filed for this visit.  General: Intubated on vent  but responsive.  Discussed trach with her. Nasal: Clear nasal passages Neck: No palpable adenopathy or thyroid nodules. Trach midline. Cardiovascular: Iregular rate and rhythm, no murmur.  Respiratory: Clear to auscultation   Assessment/Plan: lrespiratory failure Plan for Procedure(s): TRACHEOSTOMY   Dillard Cannonhristopher Ki Corbo, MD 07/25/2017 11:51 AM

## 2017-07-25 NOTE — Anesthesia Postprocedure Evaluation (Signed)
Anesthesia Post Note  Patient: Martha Carter  Procedure(s) Performed: TRACHEOSTOMY (N/A Neck)     Patient location during evaluation: PACU Anesthesia Type: General Level of consciousness: awake and alert Pain management: pain level controlled Vital Signs Assessment: post-procedure vital signs reviewed and stable Respiratory status: spontaneous breathing, nonlabored ventilation, respiratory function stable and patient connected to nasal cannula oxygen Cardiovascular status: blood pressure returned to baseline and stable Postop Assessment: no apparent nausea or vomiting Anesthetic complications: no    Last Vitals: There were no vitals filed for this visit.  Last Pain: There were no vitals filed for this visit.               Gulianna Hornsby DAVID

## 2017-07-25 NOTE — Anesthesia Procedure Notes (Signed)
Date/Time: 07/25/2017 12:36 PM Performed by: Nils PyleBell, Kampbell Holaway T, CRNA Pre-anesthesia Checklist: Patient identified, Emergency Drugs available, Suction available and Patient being monitored Patient Re-evaluated:Patient Re-evaluated prior to induction Oxygen Delivery Method: Ambu bag Preoxygenation: Pre-oxygenation with 100% oxygen Induction Type: Inhalational induction with existing ETT Placement Confirmation: positive ETCO2,  breath sounds checked- equal and bilateral and CO2 detector Secured at: 21 cm Tube secured with: Tape Dental Injury: Teeth and Oropharynx as per pre-operative assessment

## 2017-07-25 NOTE — Anesthesia Preprocedure Evaluation (Signed)
Anesthesia Evaluation  Patient identified by MRN, date of birth, ID band Patient awake    Reviewed: Allergy & Precautions, NPO status , Patient's Chart, lab work & pertinent test results  Airway Mallampati: Intubated       Dental   Pulmonary former smoker,    Pulmonary exam normal        Cardiovascular + CAD and + CABG  Normal cardiovascular exam     Neuro/Psych    GI/Hepatic   Endo/Other  diabetes, Type 2, Oral Hypoglycemic Agents  Renal/GU      Musculoskeletal   Abdominal   Peds  Hematology   Anesthesia Other Findings   Reproductive/Obstetrics                             Anesthesia Physical Anesthesia Plan  ASA: III  Anesthesia Plan: General   Post-op Pain Management:    Induction: Intravenous  PONV Risk Score and Plan: Treatment may vary due to age or medical condition  Airway Management Planned: Oral ETT  Additional Equipment:   Intra-op Plan:   Post-operative Plan: Post-operative intubation/ventilation  Informed Consent: I have reviewed the patients History and Physical, chart, labs and discussed the procedure including the risks, benefits and alternatives for the proposed anesthesia with the patient or authorized representative who has indicated his/her understanding and acceptance.     Plan Discussed with: CRNA and Surgeon  Anesthesia Plan Comments:         Anesthesia Quick Evaluation

## 2017-07-26 ENCOUNTER — Other Ambulatory Visit (HOSPITAL_COMMUNITY): Payer: Self-pay

## 2017-07-26 ENCOUNTER — Encounter (HOSPITAL_COMMUNITY): Payer: Self-pay | Admitting: Otolaryngology

## 2017-07-26 DIAGNOSIS — I482 Chronic atrial fibrillation: Secondary | ICD-10-CM

## 2017-07-26 DIAGNOSIS — J9621 Acute and chronic respiratory failure with hypoxia: Secondary | ICD-10-CM | POA: Diagnosis not present

## 2017-07-26 DIAGNOSIS — I2510 Atherosclerotic heart disease of native coronary artery without angina pectoris: Secondary | ICD-10-CM | POA: Diagnosis not present

## 2017-07-26 DIAGNOSIS — J181 Lobar pneumonia, unspecified organism: Secondary | ICD-10-CM | POA: Diagnosis not present

## 2017-07-26 DIAGNOSIS — I2583 Coronary atherosclerosis due to lipid rich plaque: Secondary | ICD-10-CM

## 2017-07-26 NOTE — Progress Notes (Signed)
[facility]   PULMONARY SERVICE  PROGRESS NOTE  Date of Service: 07/26/2017  Martha BustleGlenda W Carter  NWG:[id number]  DOB: [birth year]   DOA: 07/25/2017  Referring Physician: Carron CurieAli Hijazi, MD  HPI: Martha Carter is a 57 y.o. female seen for follow up of Acute on Chronic Respiratory Failure.  Patient had tracheostomy done yesterday today was placed on T collar for about 4 hours on 35% FiO2  Medications: Reviewed on Rounds  Physical Exam:  Vitals: Temperature 96.7 pulse 101 respiratory 20 blood pressure 120/66 saturations 97%  Ventilator Settings currently on T collar 35% FiO2  . General: Comfortable at this time . Eyes: Grossly normal lids, irises & conjunctiva . ENT: grossly tongue is normal . Neck: no obvious mass . Cardiovascular: S1 S2 normal no gallop . Respiratory: No rhonchi rales . Abdomen: soft . Skin: no rash seen on limited exam . Musculoskeletal: not rigid . Psychiatric:unable to assess . Neurologic: no seizure no involuntary movements         Lab Data:   Basic Metabolic Panel: Recent Labs  Lab 07/20/17 0455 07/21/17 0636 07/23/17 0741 07/25/17 0705  NA 144 147* 143 142  K 5.2* 4.4 4.3 4.3  CL 101 104 105 104  CO2 30 33* 28 29  GLUCOSE 216* 235* 175* 128*  BUN 60* 51* 37* 37*  CREATININE 1.22* 1.17* 1.13* 1.14*  CALCIUM 10.0 9.8 9.9 9.8  MG  --  2.8* 2.8* 2.8*  PHOS  --   --   --  4.3    Liver Function Tests: No results for input(s): AST, ALT, ALKPHOS, BILITOT, PROT, ALBUMIN in the last 168 hours. No results for input(s): LIPASE, AMYLASE in the last 168 hours. No results for input(s): AMMONIA in the last 168 hours.  CBC: Recent Labs  Lab 07/20/17 0455 07/23/17 0741 07/25/17 0705  WBC 9.5 7.8 7.2  HGB 10.0* 9.3* 8.2*  HCT 32.1* 29.6* 26.0*  MCV 93.9 92.5 92.5  PLT 176 176 180    Cardiac Enzymes: No results for input(s): CKTOTAL, CKMB, CKMBINDEX, TROPONINI in the last 168 hours.  BNP  (last 3 results) No results for input(s): BNP in the last 8760 hours.  ProBNP (last 3 results) No results for input(s): PROBNP in the last 8760 hours.  Radiological Exams: Dg Abd 1 View  Result Date: 07/25/2017 CLINICAL DATA:  Status post nasogastric tube placement. EXAM: ABDOMEN - 1 VIEW COMPARISON:  Lower chest and upper abdominal radiograph of July 18, 2017 FINDINGS: An esophagogastric tube is present which appears to lie to the right of the lumbar spine in the stomach. Adjacent surgical clips in the gallbladder fossa are again demonstrated. IMPRESSION: The esophagogastric tube tip and proximal port likely lie within the stomach. Electronically Signed   By: David  SwazilandJordan M.D.   On: 07/25/2017 14:46    Assessment/Plan Principal Problem:   Acute on chronic respiratory failure with hypoxia (HCC) Active Problems:   Lobar pneumonia, unspecified organism [facility](HCC)   Coronary artery disease   Chronic atrial fibrillation (HCC)   1. Acute on chronic respiratory failure with hypoxia we will continue weaning as noted T collar trials tomorrow will be doubled we will continue present management. 2. Lobar pneumonia treated stable 3. Coronary artery disease no active chest pain 4. Chronic atrial fibrillation rate is controlled we will continue to monitor.   I have personally seen and evaluated the patient, evaluated laboratory and imaging results, formulated the assessment and plan and placed orders.  The Patient requires high complexity decision making for assessment and support.  Case was discussed on Rounds with the Respiratory Therapy Staff  Allyne Gee, MD [facility] Pulmonary Critical Care Medicine Sleep Medicine

## 2017-07-27 DIAGNOSIS — I2510 Atherosclerotic heart disease of native coronary artery without angina pectoris: Secondary | ICD-10-CM | POA: Diagnosis not present

## 2017-07-27 DIAGNOSIS — J9621 Acute and chronic respiratory failure with hypoxia: Secondary | ICD-10-CM | POA: Diagnosis not present

## 2017-07-27 DIAGNOSIS — I482 Chronic atrial fibrillation: Secondary | ICD-10-CM | POA: Diagnosis not present

## 2017-07-27 DIAGNOSIS — J181 Lobar pneumonia, unspecified organism: Secondary | ICD-10-CM | POA: Diagnosis not present

## 2017-07-27 LAB — BASIC METABOLIC PANEL
Anion gap: 9 (ref 5–15)
BUN: 29 mg/dL — ABNORMAL HIGH (ref 6–20)
CHLORIDE: 107 mmol/L (ref 98–111)
CO2: 28 mmol/L (ref 22–32)
Calcium: 9.6 mg/dL (ref 8.9–10.3)
Creatinine, Ser: 1.19 mg/dL — ABNORMAL HIGH (ref 0.44–1.00)
GFR, EST AFRICAN AMERICAN: 58 mL/min — AB (ref >60)
GFR, EST NON AFRICAN AMERICAN: 50 mL/min — AB (ref >60)
Glucose, Bld: 193 mg/dL — ABNORMAL HIGH (ref 70–99)
POTASSIUM: 3.4 mmol/L — AB (ref 3.5–5.1)
SODIUM: 144 mmol/L (ref 135–145)

## 2017-07-27 LAB — CBC
HCT: 23.6 % — ABNORMAL LOW (ref 36.0–46.0)
HEMOGLOBIN: 7.3 g/dL — AB (ref 12.0–15.0)
MCH: 28.7 pg (ref 26.0–34.0)
MCHC: 30.9 g/dL (ref 30.0–36.0)
MCV: 92.9 fL (ref 78.0–100.0)
PLATELETS: 184 10*3/uL (ref 150–400)
RBC: 2.54 MIL/uL — AB (ref 3.87–5.11)
RDW: 16.5 % — ABNORMAL HIGH (ref 11.5–15.5)
WBC: 7.2 10*3/uL (ref 4.0–10.5)

## 2017-07-27 LAB — PHOSPHORUS: PHOSPHORUS: 2.7 mg/dL (ref 2.5–4.6)

## 2017-07-27 LAB — MAGNESIUM: MAGNESIUM: 2.4 mg/dL (ref 1.7–2.4)

## 2017-07-27 NOTE — Progress Notes (Signed)
Pulmonary Critical Care Medicine [facility] [place]   PULMONARY SERVICE  PROGRESS NOTE  Date of Service: 07/27/2017  Martha Carter  ZOX:[id number]  DOB: 1960/08/15   DOA: 07/25/2017  Referring Physician: Carron Curie, MD  HPI: Martha Carter is a 57 y.o. female seen for follow up of Acute on Chronic Respiratory Failure.  Patient is on full support currently on assist control mode has been on 20% FiO2 the patient did wean on pressure support yesterday and today the goal was to wean on pressure support for 8 hours.  Medications: Reviewed on Rounds  Physical Exam:  Vitals: Temperature 98.2 pulse 101 respiratory rate 24 blood pressure 134/69 saturations 97%  Ventilator Settings mode of ventilation assist control FiO2 28% tidal volume 317 PEEP 5  . General: Comfortable at this time . Eyes: Grossly normal lids, irises & conjunctiva . ENT: grossly tongue is normal . Neck: no obvious mass . Cardiovascular: S1 S2 normal no gallop . Respiratory: Coarse rhonchi noted bilaterally . Abdomen: soft . Skin: no rash seen on limited exam . Musculoskeletal: not rigid . Psychiatric:unable to assess . Neurologic: no seizure no involuntary movements         Lab Data:   Basic Metabolic Panel: Recent Labs  Lab 07/21/17 0636 07/23/17 0741 07/25/17 0705  NA 147* 143 142  K 4.4 4.3 4.3  CL 104 105 104  CO2 33* 28 29  GLUCOSE 235* 175* 128*  BUN 51* 37* 37*  CREATININE 1.17* 1.13* 1.14*  CALCIUM 9.8 9.9 9.8  MG 2.8* 2.8* 2.8*  PHOS  --   --  4.3    Liver Function Tests: No results for input(s): AST, ALT, ALKPHOS, BILITOT, PROT, ALBUMIN in the last 168 hours. No results for input(s): LIPASE, AMYLASE in the last 168 hours. No results for input(s): AMMONIA in the last 168 hours.  CBC: Recent Labs  Lab 07/23/17 0741 07/25/17 0705 07/27/17 0703  WBC 7.8 7.2 7.2  HGB 9.3* 8.2* 7.3*  HCT 29.6* 26.0* 23.6*  MCV 92.5 92.5 92.9  PLT 176 180 184    Cardiac  Enzymes: No results for input(s): CKTOTAL, CKMB, CKMBINDEX, TROPONINI in the last 168 hours.  BNP (last 3 results) No results for input(s): BNP in the last 8760 hours.  ProBNP (last 3 results) No results for input(s): PROBNP in the last 8760 hours.  Radiological Exams: Ct Abdomen Wo Contrast  Result Date: 07/26/2017 CLINICAL DATA:  Dysphagia, malnutrition, assess for fluoroscopic gastrostomy insertion EXAM: CT ABDOMEN WITHOUT CONTRAST TECHNIQUE: Multidetector CT imaging of the abdomen was performed following the standard protocol without IV contrast. COMPARISON:  07/25/2017 FINDINGS: Lower chest: Non loculated pleural effusions present bilaterally, small on the right and larger on the left. There is associated left lower lobe collapse/consolidation. Minor right base atelectasis. Previous median sternotomy noted. Mild cardiomegaly. No pericardial effusion. NG tube within a nondilated esophagus entering the stomach. Hepatobiliary: No focal liver abnormality is seen. Status post cholecystectomy. No biliary dilatation. Pancreas: Mild atrophy of the pancreas. No ductal dilatation or focal abnormality. No surrounding inflammatory process or fluid collection. Spleen: Splenomegaly noted, measuring 20 cm in length. No focal splenic lesion. Adrenals/Urinary Tract: Mild nodular enlargement of the adrenal glands, nonspecific. Kidneys demonstrate nonspecific peri nephric strandy edema without obstruction or hydronephrosis. No hydroureter or obstructing ureteral calculus demonstrated. Bladder not imaged. Stomach/Bowel: Stomach is anteriorly positioned in the abdomen inferior to the left hepatic lobe and superior to the colon. Anatomy appears favorable for fluoroscopic gastrostomy technique. Negative for  bowel obstruction, significant dilatation, ileus, or free air. No fluid collection or abscess. Vascular/Lymphatic: Atherosclerosis of aorta. Negative for aneurysm. No retroperitoneal hemorrhage or hematoma. No  adenopathy. Other: No abdominal wall hernia or abnormality. Musculoskeletal: Degenerative changes noted of the spine. Chronic appearing L3 and L5 compression fractures. IMPRESSION: Stomach anatomy is amenable to fluoroscopic percutaneous gastrostomy technique. Negative for significant hiatal hernia, bowel obstruction, or ileus. Pleural effusions present bilaterally, larger on the left with left lower lobe collapse/consolidation. Difficult to exclude pneumonia. Minor right base atelectasis. Remote cholecystectomy without biliary obstruction Splenomegaly Abdominal aortic atherosclerosis without aneurysm Electronically Signed   By: Judie PetitM.  Shick M.D.   On: 07/26/2017 12:43   Dg Abd 1 View  Result Date: 07/25/2017 CLINICAL DATA:  Status post nasogastric tube placement. EXAM: ABDOMEN - 1 VIEW COMPARISON:  Lower chest and upper abdominal radiograph of July 18, 2017 FINDINGS: An esophagogastric tube is present which appears to lie to the right of the lumbar spine in the stomach. Adjacent surgical clips in the gallbladder fossa are again demonstrated. IMPRESSION: The esophagogastric tube tip and proximal port likely lie within the stomach. Electronically Signed   By: David  SwazilandJordan M.D.   On: 07/25/2017 14:46    Assessment/Plan Principal Problem:   Acute on chronic respiratory failure with hypoxia (HCC) Active Problems:   Lobar pneumonia, unspecified organism [facility](HCC)   Coronary artery disease   Chronic atrial fibrillation (HCC)   1. Acute on chronic respiratory failure with hypoxia we will continue weaning and start on pressure support as noted above. 2. Lobar pneumonia treated with antibiotics we will continue to follow 3. Coronary artery disease stable no active chest pain 4. Chronic atrial fibrillation rate is controlled   I have personally seen and evaluated the patient, evaluated laboratory and imaging results, formulated the assessment and plan and placed orders. The Patient requires high complexity  decision making for assessment and support.  Case was discussed on Rounds with the Respiratory Therapy Staff  Yevonne PaxSaadat A Masiah Lewing, MD [facility] Pulmonary Critical Care Medicine Sleep Medicine

## 2017-07-27 NOTE — Consult Note (Signed)
Chief Complaint: Patient was seen in consultation today for dysphagia  Referring Physician(s): Dr. Sharyon Medicus  Supervising Physician: Richarda Overlie  Patient Status: [facility] - In-pt  History of Present Illness: Martha Carter is a 57 y.o. female with past medical history of chronic anemia, a fib, CAD, DM, and recent pneumonia now with VDRF s/p trach placement who is admitted to [facility] for ongoing care.  IR consulted for gastrostomy tube placement at the request of Dr. Sharyon Medicus.   CT Abdomen completed and reviewed; she has been approved for procedure by Dr. Miles Costain.    Past Medical History:  Diagnosis Date  . Acute on chronic respiratory failure with hypoxia (HCC)   . Chronic anemia   . Chronic atrial fibrillation (HCC)   . Chronic pain syndrome   . Coronary artery disease   . 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 GI bleed   . Lobar pneumonia, unspecified organism (HCC)   . Peripheral arterial disease [facility])     Past Surgical History:  Procedure Laterality Date  . ANGIOPLASTY    . CHOLECYSTECTOMY    . COLONOSCOPY    . CORONARY ARTERY BYPASS GRAFT    . HYSTERECTOMY ABDOMINAL WITH SALPINGECTOMY    . KNEE SURGERY    . TONSILLECTOMY    . TRACHEOSTOMY TUBE PLACEMENT N/A 07/25/2017   Procedure: TRACHEOSTOMY;  Surgeon: Drema Halon, MD;  Location: [facility] OR;  Service: ENT;  Laterality: N/A;    Allergies: Patient has no allergy information on record.  Medications: Prior to Admission medications   Not on File     Family History  Family history unknown: Yes    Social History   Socioeconomic History  . Marital status: Divorced    Spouse name: Not on file  . Number of children: Not on file  . Years of education: Not on file  . Highest education level: Not on file  Occupational History  . Not on file  Social Needs  . Financial resource strain: Not on file  . Food insecurity:    Worry: Not on file    Inability:  Not on file  . Transportation needs:    Medical: Not on file    Non-medical: Not on file  Tobacco Use  . Smoking status: Former Games developer  . Smokeless tobacco: Never Used  Substance and Sexual Activity  . Alcohol use: Not Currently  . Drug use: Not Currently  . Sexual activity: Not Currently  Lifestyle  . Physical activity:    Days per week: Not on file    Minutes per session: Not on file  . Stress: Not on file  Relationships  . Social connections:    Talks on phone: Not on file    Gets together: Not on file    Attends religious service: Not on file    Active member of club or organization: Not on file    Attends meetings of clubs or organizations: Not on file    Relationship status: Not on file  Other Topics Concern  . Not on file  Social History Narrative  . Not on file     Review of Systems: A 12 point ROS discussed and pertinent positives are indicated in the HPI above.  All other systems are negative.  Review of Systems  Unable to perform ROS: Mental status change    Vital Signs: LMP  (LMP Unknown)   Physical Exam  Constitutional: She is oriented to person, place,  and time. She appears well-developed.  Cardiovascular: Normal rate, regular rhythm and normal heart sounds.  Pulmonary/Chest: Effort normal. No respiratory distress.  On trach collar, coarse breath sounds  Abdominal: Soft. Bowel sounds are normal. She exhibits distension. There is no tenderness.  Neurological: She is oriented to person, place, and time.  Skin: Skin is warm and dry.  Psychiatric: She has a normal mood and affect. Her behavior is normal. Judgment and thought content normal.  Nursing note and vitals reviewed.    MD Evaluation Airway: WNL Heart: WNL Abdomen: WNL Chest/ Lungs: WNL ASA  Classification: 3 Mallampati/Airway Score: Two   Imaging: Ct Abdomen Wo Contrast  Result Date: 07/26/2017 CLINICAL DATA:  Dysphagia, malnutrition, assess for fluoroscopic gastrostomy insertion  EXAM: CT ABDOMEN WITHOUT CONTRAST TECHNIQUE: Multidetector CT imaging of the abdomen was performed following the standard protocol without IV contrast. COMPARISON:  07/25/2017 FINDINGS: Lower chest: Non loculated pleural effusions present bilaterally, small on the right and larger on the left. There is associated left lower lobe collapse/consolidation. Minor right base atelectasis. Previous median sternotomy noted. Mild cardiomegaly. No pericardial effusion. NG tube within a nondilated esophagus entering the stomach. Hepatobiliary: No focal liver abnormality is seen. Status post cholecystectomy. No biliary dilatation. Pancreas: Mild atrophy of the pancreas. No ductal dilatation or focal abnormality. No surrounding inflammatory process or fluid collection. Spleen: Splenomegaly noted, measuring 20 cm in length. No focal splenic lesion. Adrenals/Urinary Tract: Mild nodular enlargement of the adrenal glands, nonspecific. Kidneys demonstrate nonspecific peri nephric strandy edema without obstruction or hydronephrosis. No hydroureter or obstructing ureteral calculus demonstrated. Bladder not imaged. Stomach/Bowel: Stomach is anteriorly positioned in the abdomen inferior to the left hepatic lobe and superior to the colon. Anatomy appears favorable for fluoroscopic gastrostomy technique. Negative for bowel obstruction, significant dilatation, ileus, or free air. No fluid collection or abscess. Vascular/Lymphatic: Atherosclerosis of aorta. Negative for aneurysm. No retroperitoneal hemorrhage or hematoma. No adenopathy. Other: No abdominal wall hernia or abnormality. Musculoskeletal: Degenerative changes noted of the spine. Chronic appearing L3 and L5 compression fractures. IMPRESSION: Stomach anatomy is amenable to fluoroscopic percutaneous gastrostomy technique. Negative for significant hiatal hernia, bowel obstruction, or ileus. Pleural effusions present bilaterally, larger on the left with left lower lobe  collapse/consolidation. Difficult to exclude pneumonia. Minor right base atelectasis. Remote cholecystectomy without biliary obstruction Splenomegaly Abdominal aortic atherosclerosis without aneurysm Electronically Signed   By: Judie Petit.  Shick M.D.   On: 07/26/2017 12:43   Dg Abd 1 View  Result Date: 07/25/2017 CLINICAL DATA:  Status post nasogastric tube placement. EXAM: ABDOMEN - 1 VIEW COMPARISON:  Lower chest and upper abdominal radiograph of July 18, 2017 FINDINGS: An esophagogastric tube is present which appears to lie to the right of the lumbar spine in the stomach. Adjacent surgical clips in the gallbladder fossa are again demonstrated. IMPRESSION: The esophagogastric tube tip and proximal port likely lie within the stomach. Electronically Signed   By: David  [place]aziland M.D.   On: 07/25/2017 14:46   Dg Chest Port 1 View  Result Date: 07/17/2017 CLINICAL DATA:  57 year old female with respiratory failure. Initial encounter. EXAM: PORTABLE CHEST 1 VIEW COMPARISON:  07/14/2017 chest x-ray. FINDINGS: Endotracheal tube tip 2.8 cm above the carina. Right central line tip distal superior vena cava level. Nasogastric tube courses below the diaphragm. Tip is not included on the present exam. Side hole just beyond the gastroesophageal junction. Improved aeration right lung. Persistent consolidation left lung which may partially be explained by posteriorly layering left-sided pleural effusion. Left base atelectasis or  infiltrate may be present. No pneumothorax noted. Mild cardiomegaly. No acute osseous abnormality. IMPRESSION: Improved aeration right lung. Suspect posteriorly layering left-sided pleural effusion and left base atelectasis versus infiltrate. Mild cardiomegaly. Electronically Signed   By: Lacy Duverney M.D.   On: 07/17/2017 07:54   Dg Chest Port 1 View  Result Date: 07/14/2017 CLINICAL DATA:  Intubation. EXAM: PORTABLE CHEST 1 VIEW COMPARISON:  None. FINDINGS: Endotracheal tube tip projects 4.6 cm above  the Carina. Nasal/orogastric tube passes well below the diaphragm into the stomach and below the included field of view. Right PICC tip projects at the caval atrial junction. Lungs demonstrate vascular congestion, interstitial prominence and mild hazy opacity. More confluent opacity is noted at the left lung base consistent with pleural fluid. There is a probable small right pleural effusion. There changes from a prior median sternotomy. Cardiac silhouette is normal in size. No convincing mediastinal or hilar masses. No evidence of a pneumothorax on this semi-erect study. IMPRESSION: 1. Endotracheal tube tip projects 4.6 cm above the Carina. 2. Well-positioned nasal/orogastric tube and right PICC. 3. Findings consistent with congestive heart failure with interstitial and hazy airspace pulmonary edema and left greater than right pleural effusions. Electronically Signed   By: Amie Portland M.D.   On: 07/14/2017 18:31   Dg Abd Portable 1v  Result Date: 07/18/2017 CLINICAL DATA:  Feeding tube placement. EXAM: PORTABLE ABDOMEN - 1 VIEW COMPARISON:  07/14/2017. FINDINGS: Prior CABG. Endotracheal tube noted with its tip 3 cm above the carina. NG tube noted with tip below left hemidiaphragm. Tip is in stable position. No gastric distention. Surgical clips right upper quadrant. IMPRESSION: NG tube noted with tip below left hemidiaphragm. Tip is in stable position. Electronically Signed   By: Maisie Fus  Register   On: 07/18/2017 07:34   Dg Abd Portable 1v  Result Date: 07/14/2017 CLINICAL DATA:  Encounter for nasogastric tube placement. EXAM: PORTABLE ABDOMEN - 1 VIEW COMPARISON:  None. FINDINGS: Nasogastric tube passes well below the diaphragm into the mid stomach. There is no bowel dilation to suggest obstruction. There are surgical clips are upper quadrant from a prior cholecystectomy. IMPRESSION: Well-positioned nasogastric tube. Electronically Signed   By: Amie Portland M.D.   On: 07/14/2017 18:31     Labs:  CBC: Recent Labs    07/20/17 0455 07/23/17 0741 07/25/17 0705 07/27/17 0703  WBC 9.5 7.8 7.2 7.2  HGB 10.0* 9.3* 8.2* 7.3*  HCT 32.1* 29.6* 26.0* 23.6*  PLT 176 176 180 184    COAGS: Recent Labs    07/15/17 0618  INR 1.12    BMP: Recent Labs    07/21/17 0636 07/23/17 0741 07/25/17 0705 07/27/17 0703  NA 147* 143 142 144  K 4.4 4.3 4.3 3.4*  CL 104 105 104 107  CO2 33* 28 29 28   GLUCOSE 235* 175* 128* 193*  BUN 51* 37* 37* 29*  CALCIUM 9.8 9.9 9.8 9.6  CREATININE 1.17* 1.13* 1.14* 1.19*  GFRNONAA 51* 53* 52* 50*  GFRAA 59* >60 >60 58*    LIVER FUNCTION TESTS: Recent Labs    07/15/17 0618  BILITOT 0.9  AST 17  ALT 15  ALKPHOS 69  PROT 6.9  ALBUMIN 2.7*    TUMOR MARKERS: No results for input(s): AFPTM, CEA, CA199, CHROMGRNA in the last 8760 hours.  Assessment and Plan: Dysphagia Patient with VDRF, s/p trach placement.  Now in need of gastrostomy tube placement for long-term care.  CT reviewed by Dr. Miles Costain who approves patient for procedure.  Spoke with brother, Dorene SorrowJerry, for consent.   Risks and benefits discussed including, but not limited to the need for a barium enema during the procedure, bleeding, infection, peritonitis, or damage to adjacent structures.  All questions were answered, brother is agreeable to proceed. Consent signed and in chart.   Thank you for this interesting consult.  I greatly enjoyed meeting Parissa W Dickard and look forward to participating in their care.  A copy of this report was sent to the requesting provider on this date.  Electronically Signed: Hoyt KochKacie Sue-Ellen Matthews, PA 07/27/2017, 3:25 PM   I spent a total of 40 Minutes    in face to face in clinical consultation, greater than 50% of which was counseling/coordinating care for dysphagia.

## 2017-07-28 ENCOUNTER — Encounter (HOSPITAL_COMMUNITY): Payer: Self-pay | Admitting: Interventional Radiology

## 2017-07-28 ENCOUNTER — Other Ambulatory Visit (HOSPITAL_COMMUNITY): Payer: Self-pay

## 2017-07-28 DIAGNOSIS — J181 Lobar pneumonia, unspecified organism: Secondary | ICD-10-CM | POA: Diagnosis not present

## 2017-07-28 DIAGNOSIS — I482 Chronic atrial fibrillation: Secondary | ICD-10-CM | POA: Diagnosis not present

## 2017-07-28 DIAGNOSIS — J9621 Acute and chronic respiratory failure with hypoxia: Secondary | ICD-10-CM | POA: Diagnosis not present

## 2017-07-28 DIAGNOSIS — I2510 Atherosclerotic heart disease of native coronary artery without angina pectoris: Secondary | ICD-10-CM | POA: Diagnosis not present

## 2017-07-28 HISTORY — PX: IR GASTROSTOMY TUBE MOD SED: IMG625

## 2017-07-28 LAB — BASIC METABOLIC PANEL
Anion gap: 10 (ref 5–15)
BUN: 29 mg/dL — ABNORMAL HIGH (ref 6–20)
CHLORIDE: 109 mmol/L (ref 98–111)
CO2: 27 mmol/L (ref 22–32)
Calcium: 9.7 mg/dL (ref 8.9–10.3)
Creatinine, Ser: 1.04 mg/dL — ABNORMAL HIGH (ref 0.44–1.00)
GFR calc non Af Amer: 58 mL/min — ABNORMAL LOW (ref >60)
Glucose, Bld: 170 mg/dL — ABNORMAL HIGH (ref 70–99)
POTASSIUM: 4.1 mmol/L (ref 3.5–5.1)
SODIUM: 146 mmol/L — AB (ref 135–145)

## 2017-07-28 LAB — CBC
HEMATOCRIT: 25.9 % — AB (ref 36.0–46.0)
HEMOGLOBIN: 7.9 g/dL — AB (ref 12.0–15.0)
MCH: 28.8 pg (ref 26.0–34.0)
MCHC: 30.5 g/dL (ref 30.0–36.0)
MCV: 94.5 fL (ref 78.0–100.0)
Platelets: 203 10*3/uL (ref 150–400)
RBC: 2.74 MIL/uL — AB (ref 3.87–5.11)
RDW: 16.6 % — ABNORMAL HIGH (ref 11.5–15.5)
WBC: 8.4 10*3/uL (ref 4.0–10.5)

## 2017-07-28 LAB — PROTIME-INR
INR: 1.3
PROTHROMBIN TIME: 16.1 s — AB (ref 11.4–15.2)

## 2017-07-28 MED ORDER — LIDOCAINE HCL (PF) 1 % IJ SOLN
INTRAMUSCULAR | Status: AC | PRN
  Administered 2017-07-28: 5 mL

## 2017-07-28 MED ORDER — CEFAZOLIN SODIUM-DEXTROSE 2-4 GM/100ML-% IV SOLN: 2.0000 g | Freq: Once | INTRAVENOUS | Status: DC

## 2017-07-28 MED ORDER — MIDAZOLAM HCL 2 MG/2ML IJ SOLN
INTRAMUSCULAR | Status: AC | PRN
  Administered 2017-07-28: 1 mg via INTRAVENOUS

## 2017-07-28 MED ORDER — FENTANYL CITRATE (PF) 100 MCG/2ML IJ SOLN
INTRAMUSCULAR | Status: AC
  Filled 2017-07-28: qty 2

## 2017-07-28 MED ORDER — IOPAMIDOL (ISOVUE-300) INJECTION 61%
50.0000 mL | Freq: Once | INTRAVENOUS | Status: AC | PRN
Start: 2017-07-28 — End: 2017-07-28
  Administered 2017-07-28: 15 mL

## 2017-07-28 MED ORDER — FENTANYL CITRATE (PF) 100 MCG/2ML IJ SOLN
INTRAMUSCULAR | Status: AC | PRN
  Administered 2017-07-28 (×2): 25 ug via INTRAVENOUS

## 2017-07-28 MED ORDER — CEFAZOLIN SODIUM-DEXTROSE 2-4 GM/100ML-% IV SOLN
INTRAVENOUS | Status: AC
  Filled 2017-07-28: qty 100

## 2017-07-28 MED ORDER — MIDAZOLAM HCL 2 MG/2ML IJ SOLN
INTRAMUSCULAR | Status: AC
  Filled 2017-07-28: qty 2

## 2017-07-28 MED ORDER — VANCOMYCIN HCL IN DEXTROSE 1-5 GM/200ML-% IV SOLN
INTRAVENOUS | Status: AC
  Filled 2017-07-28: qty 200

## 2017-07-28 MED ORDER — VANCOMYCIN HCL IN DEXTROSE 1-5 GM/200ML-% IV SOLN
1000.0000 mg | Freq: Once | INTRAVENOUS | Status: AC
  Administered 2017-07-28: 1000 mg via INTRAVENOUS

## 2017-07-28 NOTE — Procedures (Signed)
Interventional Radiology Procedure Note  Procedure: Percutaneous gastrostomy tube placement  Complications: None  Estimated Blood Loss: < 10 mL  Findings: 20 Fr bumper retention gastrostomy tube placed with tip in body of stomach. OK to use in 24 hours.  Lige Lakeman T. Siaosi Alter, M.D Pager:  [phone]   

## 2017-07-28 NOTE — Op Note (Signed)
NAME: Laurina BustleHUTCHENS, Mckenzye W. MEDICAL RECORD [id number] ACCOUNT [id number] DATE OF BIRTH:1960/11/05 FACILITY: [facility] LOCATION: [facility] PHYSICIAN:Sanyla Summey Braxton FeathersE. Karen Kinnard, MD  OPERATIVE REPORT  DATE OF PROCEDURE:  07/25/2017  PREOPERATIVE DIAGNOSIS:  Acute on chronic respiratory failure.  POSTOPERATIVE DIAGNOSIS:  Acute on chronic respiratory failure.  OPERATION:  tracheotomy with a #6 Shiley.  SURGEON:  Dillard Cannonhristopher Sire Poet, M.D.  ANESTHESIA:  General endotracheal.  COMPLICATIONS:  None.  BRIEF CLINICAL NOTE:  the patient is a  57 year old female with a history of right lower lobe pneumonia, anemia, hypertension, heart failure.  She has had history of chronic bed sores with secondary infection.  She was initially intubated on 07/07/2017.   Attempts at extubation have been unsuccessful and [facility] and on the vent.  Attempts at weaning her have been unsuccessful and a tracheotomy was recommended.  She is taken  to the operating room at this time for tracheotomy.  DESCRIPTION OF PROCEDURE:  [facility], remained in her bed.  A roll was placed under her shoulders to extend her neck.  The patient was mildly obese with a very thick neck.  A vertical  incision was made just below the cricoid cartilage level.  Dissection was carried down through the subcutaneous tissue with cautery.  The cricoid and trachea was identified.  The thyroid isthmus, which was thickened and high, had to be divided with  cautery.  The first 2 tracheal rings along with the cricoid cartilage were exposed.  Horizontal tracheotomy was performed between the first and second tracheal rings.  Endotracheal tube was removed and a #6 Shiley tube was inserted without difficulty.   The patient was ventilated well.  This was secured to the neck with 2-0 silk sutures x4 and a Velcro trach collar.     [facility]  AN/NUANCE  D:07/28/2017 T:07/28/2017 JOB:[id number]

## 2017-07-28 NOTE — Progress Notes (Signed)
Pulmonary Critical Care Medicine [facility]   PULMONARY SERVICE  PROGRESS NOTE  Date of Service: 07/28/2017  Martha Carter  AVW:[id number]  DOB: 10-Dec-1960   DOA: 07/25/2017  Referring Physician: Carron CurieAli Hijazi, MD  HPI: Martha Carter is a 57 y.o. female seen for follow up of Acute on Chronic Respiratory Failure.  She is on T collar currently on 28% FiO2 has been tolerating it well are going to extend the T collar as tolerated  Medications: Reviewed on Rounds  Physical Exam:  Vitals: Temperature 97.0 pulse 106 respiratory rate 24 blood pressure 156/78 saturations 97%  Ventilator Settings on T collar  . General: Comfortable at this time . Eyes: Grossly normal lids, irises & conjunctiva . ENT: grossly tongue is normal . Neck: no obvious mass . Cardiovascular: S1 S2 normal no gallop . Respiratory: No rhonchi or rales are noted . Abdomen: soft . Skin: no rash seen on limited exam . Musculoskeletal: not rigid . Psychiatric:unable to assess . Neurologic: no seizure no involuntary movements         Lab Data:   Basic Metabolic Panel: Recent Labs  Lab 07/23/17 0741 07/25/17 0705 07/27/17 0703 07/28/17 0641  NA 143 142 144 146*  K 4.3 4.3 3.4* 4.1  CL 105 104 107 109  CO2 28 29 28 27   GLUCOSE 175* 128* 193* 170*  BUN 37* 37* 29* 29*  CREATININE 1.13* 1.14* 1.19* 1.04*  CALCIUM 9.9 9.8 9.6 9.7  MG 2.8* 2.8* 2.4  --   PHOS  --  4.3 2.7  --     Liver Function Tests: No results for input(s): AST, ALT, ALKPHOS, BILITOT, PROT, ALBUMIN in the last 168 hours. No results for input(s): LIPASE, AMYLASE in the last 168 hours. No results for input(s): AMMONIA in the last 168 hours.  CBC: Recent Labs  Lab 07/23/17 0741 07/25/17 0705 07/27/17 0703 07/28/17 0641  WBC 7.8 7.2 7.2 8.4  HGB 9.3* 8.2* 7.3* 7.9*  HCT 29.6* 26.0* 23.6* 25.9*  MCV 92.5 92.5 92.9 94.5  PLT 176 180 184 203    Cardiac Enzymes: No results for input(s): CKTOTAL, CKMB,  CKMBINDEX, TROPONINI in the last 168 hours.  BNP (last 3 results) No results for input(s): BNP in the last 8760 hours.  ProBNP (last 3 results) No results for input(s): PROBNP in the last 8760 hours.  Radiological Exams: No results found.  Assessment/Plan Principal Problem:   Acute on chronic respiratory failure with hypoxia (HCC) Active Problems:   Lobar pneumonia, unspecified organism [facility](HCC)   Coronary artery disease   Chronic atrial fibrillation (HCC)   1. Acute on chronic respiratory failure with hypoxia continue weaning on T collar the wean was advanced to as tolerated. 2. Lobar pneumonia treated resolved 3. Coronary disease no active chest pain 4. Chronic atrial fibrillation rate is controlled   I have personally seen and evaluated the patient, evaluated laboratory and imaging results, formulated the assessment and plan and placed orders. The Patient requires high complexity decision making for assessment and support.  Case was discussed on Rounds with the Respiratory Therapy Staff  Yevonne PaxSaadat A Khan, MD [facility] Pulmonary Critical Care Medicine Sleep Medicine

## 2017-07-29 DIAGNOSIS — J181 Lobar pneumonia, unspecified organism: Secondary | ICD-10-CM | POA: Diagnosis not present

## 2017-07-29 DIAGNOSIS — J9621 Acute and chronic respiratory failure with hypoxia: Secondary | ICD-10-CM | POA: Diagnosis not present

## 2017-07-29 DIAGNOSIS — I2510 Atherosclerotic heart disease of native coronary artery without angina pectoris: Secondary | ICD-10-CM | POA: Diagnosis not present

## 2017-07-29 DIAGNOSIS — I482 Chronic atrial fibrillation: Secondary | ICD-10-CM | POA: Diagnosis not present

## 2017-07-29 NOTE — Progress Notes (Signed)
Pulmonary [place]   PULMONARY SERVICE  PROGRESS NOTE  Date of Service: 07/29/2017  Martha Carter  [id number]  DOB: 05-23-1960   DOA: 07/25/2017  Referring Physician: Merton Border, MD  HPI: Martha Carter is a 57 y.o. female seen for follow up of Acute on Chronic Respiratory Failure.  Patient is on T collar currently is on 40% FiO2 as tolerated  Medications: Reviewed on Rounds  Physical Exam:  Vitals: Temperature 98.4 pulse 94 respiratory rate 17 blood pressure 149/77 saturations 98%  Ventilator Settings off the ventilator on T collar  . General: Comfortable at this time . Eyes: Grossly normal lids, irises & conjunctiva . ENT: grossly tongue is normal . Neck: no obvious mass . Cardiovascular: S1 S2 normal no gallop . Respiratory: Coarse rhonchi noted bilaterally . Abdomen: soft . Skin: no rash seen on limited exam . Musculoskeletal: not rigid . Psychiatric:unable to assess . Neurologic: no seizure no involuntary movements         Lab Data:   Basic Metabolic Panel: Recent Labs  Lab 07/23/17 0741 07/25/17 0705 07/27/17 0703 07/28/17 0641  NA 143 142 144 146*  K 4.3 4.3 3.4* 4.1  CL 105 104 107 109  CO2 '28 29 28 27  '$ GLUCOSE 175* 128* 193* 170*  BUN 37* 37* 29* 29*  CREATININE 1.13* 1.14* 1.19* 1.04*  CALCIUM 9.9 9.8 9.6 9.7  MG 2.8* 2.8* 2.4  --   PHOS  --  4.3 2.7  --     Liver Function Tests: No results for input(s): AST, ALT, ALKPHOS, BILITOT, PROT, ALBUMIN in the last 168 hours. No results for input(s): LIPASE, AMYLASE in the last 168 hours. No results for input(s): AMMONIA in the last 168 hours.  CBC: Recent Labs  Lab 07/23/17 0741 07/25/17 0705 07/27/17 0703 07/28/17 0641  WBC 7.8 7.2 7.2 8.4  HGB 9.3* 8.2* 7.3* 7.9*  HCT 29.6* 26.0* 23.6* 25.9*  MCV 92.5 92.5 92.9 94.5  PLT 176 180 184 203    Cardiac Enzymes: No results for input(s): CKTOTAL, CKMB, CKMBINDEX, TROPONINI in the last  168 hours.  BNP (last 3 results) No results for input(s): BNP in the last 8760 hours.  ProBNP (last 3 results) No results for input(s): PROBNP in the last 8760 hours.  Radiological Exams: Ir Gastrostomy Tube Mod Sed  Result Date: 07/28/2017 CLINICAL DATA:  Respiratory failure and need for gastrostomy tube for nutrition. EXAM: PERCUTANEOUS GASTROSTOMY TUBE PLACEMENT ANESTHESIA/SEDATION: 1.0 mg IV Versed; 50 mcg IV Fentanyl. Total Moderate Sedation Time 10 minutes. The patient's level of consciousness and physiologic status were continuously monitored during the procedure by Radiology nursing. CONTRAST:  48m ISOVUE-300 IOPAMIDOL (ISOVUE-300) INJECTION 61% MEDICATIONS: 1 g IV vancomycin. IV antibiotic was administered in an appropriate time interval prior to needle puncture of the skin. FLUOROSCOPY TIME:  1 minutes and 30 seconds.  10.1 mGy. PROCEDURE: The procedure, risks, benefits, and alternatives were explained to the patient's brother. Questions regarding the procedure were encouraged and answered. The patient's brother understands and consents to the procedure. A time-out was performed prior to initiating the procedure. A 5-French catheter was then advanced through the patient's mouth under fluoroscopy into the esophagus and to the level of the stomach. This catheter was used to insufflate the stomach with air under fluoroscopy. The abdominal wall was prepped with chlorhexidine in a sterile fashion, and a sterile drape was applied covering the operative field. A sterile gown and sterile gloves were used for  the procedure. Local anesthesia was provided with 1% Lidocaine. A skin incision was made in the upper abdominal wall. Under fluoroscopy, an 18 gauge trocar needle was advanced into the stomach. Contrast injection was performed to confirm intraluminal position of the needle tip. A single T tack was then deployed in the lumen of the stomach. This was brought up to tension at the skin surface. Over a  guidewire, a 9-French sheath was advanced into the lumen of the stomach. The wire was left in place as a safety wire. A loop snare device from a percutaneous gastrostomy kit was then advanced into the stomach. A floppy guide wire was advanced through the orogastric catheter under fluoroscopy in the stomach. The loop snare advanced through the percutaneous gastric access was used to snare the guide wire. This allowed withdrawal of the loop snare out of the patient's mouth by retraction of the orogastric catheter and wire. A 20-French bumper retention gastrostomy tube was looped around the snare device. It was then pulled back through the patient's mouth. The retention bumper was brought up to the anterior gastric wall. The T tack suture was cut at the skin. The exiting gastrostomy tube was cut to appropriate length and a feeding adapter applied. The catheter was injected with contrast material to confirm position and a fluoroscopic spot image saved. The tube was then flushed with saline. A dressing was applied over the gastrostomy exit site. COMPLICATIONS: None. FINDINGS: The stomach distended well with air allowing safe placement of the gastrostomy tube. After placement, the tip of the gastrostomy tube lies in the body of the stomach. IMPRESSION: Percutaneous gastrostomy with placement of a 20-French bumper retention tube in the body of the stomach. This tube can be used for percutaneous feeds beginning in 24 hours after placement. Electronically Signed   By: Aletta Edouard M.D.   On: 07/28/2017 17:02    Assessment/Plan Principal Problem:   Acute on chronic respiratory failure with hypoxia (HCC) Active Problems:   Lobar pneumonia, unspecified organism [facility])   Coronary artery disease   Chronic atrial fibrillation ([place])   1. Acute on chronic respiratory failure with hypoxia continue with T collar trials with going to try to wean FiO2 down continue aggressive pulmonary toilet supportive care 2. Lobar  pneumonia treated with antibiotics we will monitor 3. Coronary artery disease stable 4. Chronic atrial fibrillation rate is controlled   I have personally seen and evaluated the patient, evaluated laboratory and imaging results, formulated the assessment and plan and placed orders. The Patient requires high complexity decision making for assessment and support.  Case was discussed on Rounds with the Respiratory Therapy Staff  Allyne Gee, MD [facility] Pulmonary Critical Care Medicine Sleep Medicine

## 2017-07-30 DIAGNOSIS — I2510 Atherosclerotic heart disease of native coronary artery without angina pectoris: Secondary | ICD-10-CM | POA: Diagnosis not present

## 2017-07-30 DIAGNOSIS — J181 Lobar pneumonia, unspecified organism: Secondary | ICD-10-CM | POA: Diagnosis not present

## 2017-07-30 DIAGNOSIS — I482 Chronic atrial fibrillation: Secondary | ICD-10-CM | POA: Diagnosis not present

## 2017-07-30 DIAGNOSIS — J9621 Acute and chronic respiratory failure with hypoxia: Secondary | ICD-10-CM | POA: Diagnosis not present

## 2017-07-30 LAB — CBC
HCT: 24.4 % — ABNORMAL LOW (ref 36.0–46.0)
Hemoglobin: 7.6 g/dL — ABNORMAL LOW (ref 12.0–15.0)
MCH: 28.7 pg (ref 26.0–34.0)
MCHC: 31.1 g/dL (ref 30.0–36.0)
MCV: 92.1 fL (ref 78.0–100.0)
Platelets: 206 10*3/uL (ref 150–400)
RBC: 2.65 MIL/uL — ABNORMAL LOW (ref 3.87–5.11)
RDW: 16.3 % — ABNORMAL HIGH (ref 11.5–15.5)
WBC: 11.6 10*3/uL — ABNORMAL HIGH (ref 4.0–10.5)

## 2017-07-30 LAB — RENAL FUNCTION PANEL
Albumin: 2.4 g/dL — ABNORMAL LOW (ref 3.5–5.0)
Anion gap: 9 (ref 5–15)
BUN: 32 mg/dL — ABNORMAL HIGH (ref 6–20)
CALCIUM: 9.9 mg/dL (ref 8.9–10.3)
CO2: 27 mmol/L (ref 22–32)
CREATININE: 0.93 mg/dL (ref 0.44–1.00)
Chloride: 110 mmol/L (ref 98–111)
Glucose, Bld: 167 mg/dL — ABNORMAL HIGH (ref 70–99)
Phosphorus: 2.7 mg/dL (ref 2.5–4.6)
Potassium: 3.4 mmol/L — ABNORMAL LOW (ref 3.5–5.1)
SODIUM: 146 mmol/L — AB (ref 135–145)

## 2017-07-30 LAB — MAGNESIUM: MAGNESIUM: 2.2 mg/dL (ref 1.7–2.4)

## 2017-07-30 NOTE — Progress Notes (Signed)
Pulmonary [place]   PULMONARY SERVICE  PROGRESS NOTE  Date of Service: 07/30/2017  Martha Carter  VVO:[id number]  DOB: 07-02-1960   DOA: 07/25/2017  Referring Physician: Merton Border, MD  HPI: Martha Carter is a 57 y.o. female seen for follow up of Acute on Chronic Respiratory Failure.  Progressing nicely on T collar was able to do 15 hours patient should be able to advance as tolerated.  Right now the goal is for 24 hours if able to tolerate  Medications: Reviewed on Rounds  Physical Exam:  Vitals: Temperature 97.5 pulse 101 respiratory rate 18 blood pressure 143/66 saturations 100%  Ventilator Settings aerosolized T collar FiO2 28%  . General: Comfortable at this time . Eyes: Grossly normal lids, irises & conjunctiva . ENT: grossly tongue is normal . Neck: no obvious mass . Cardiovascular: S1 S2 normal no gallop . Respiratory: No rhonchi or rales are noted at this time . Abdomen: soft . Skin: no rash seen on limited exam . Musculoskeletal: not rigid . Psychiatric:unable to assess . Neurologic: no seizure no involuntary movements         Lab Data:   Basic Metabolic Panel: Recent Labs  Lab 07/25/17 0705 07/27/17 0703 07/28/17 0641 07/30/17 0834  NA 142 144 146* 146*  K 4.3 3.4* 4.1 3.4*  CL 104 107 109 110  CO2 '29 28 27 27  '$ GLUCOSE 128* 193* 170* 167*  BUN 37* 29* 29* 32*  CREATININE 1.14* 1.19* 1.04* 0.93  CALCIUM 9.8 9.6 9.7 9.9  MG 2.8* 2.4  --  2.2  PHOS 4.3 2.7  --  2.7    Liver Function Tests: Recent Labs  Lab 07/30/17 0834  ALBUMIN 2.4*   No results for input(s): LIPASE, AMYLASE in the last 168 hours. No results for input(s): AMMONIA in the last 168 hours.  CBC: Recent Labs  Lab 07/25/17 0705 07/27/17 0703 07/28/17 0641 07/30/17 0834  WBC 7.2 7.2 8.4 11.6*  HGB 8.2* 7.3* 7.9* 7.6*  HCT 26.0* 23.6* 25.9* 24.4*  MCV 92.5 92.9 94.5 92.1  PLT 180 184 203 206    Cardiac Enzymes: No  results for input(s): CKTOTAL, CKMB, CKMBINDEX, TROPONINI in the last 168 hours.  BNP (last 3 results) No results for input(s): BNP in the last 8760 hours.  ProBNP (last 3 results) No results for input(s): PROBNP in the last 8760 hours.  Radiological Exams: Ir Gastrostomy Tube Mod Sed  Result Date: 07/28/2017 CLINICAL DATA:  Respiratory failure and need for gastrostomy tube for nutrition. EXAM: PERCUTANEOUS GASTROSTOMY TUBE PLACEMENT ANESTHESIA/SEDATION: 1.0 mg IV Versed; 50 mcg IV Fentanyl. Total Moderate Sedation Time 10 minutes. The patient's level of consciousness and physiologic status were continuously monitored during the procedure by Radiology nursing. CONTRAST:  58m ISOVUE-300 IOPAMIDOL (ISOVUE-300) INJECTION 61% MEDICATIONS: 1 g IV vancomycin. IV antibiotic was administered in an appropriate time interval prior to needle puncture of the skin. FLUOROSCOPY TIME:  1 minutes and 30 seconds.  10.1 mGy. PROCEDURE: The procedure, risks, benefits, and alternatives were explained to the patient's brother. Questions regarding the procedure were encouraged and answered. The patient's brother understands and consents to the procedure. A time-out was performed prior to initiating the procedure. A 5-French catheter was then advanced through the patient's mouth under fluoroscopy into the esophagus and to the level of the stomach. This catheter was used to insufflate the stomach with air under fluoroscopy. The abdominal wall was prepped with chlorhexidine in a sterile fashion, and  a sterile drape was applied covering the operative field. A sterile gown and sterile gloves were used for the procedure. Local anesthesia was provided with 1% Lidocaine. A skin incision was made in the upper abdominal wall. Under fluoroscopy, an 18 gauge trocar needle was advanced into the stomach. Contrast injection was performed to confirm intraluminal position of the needle tip. A single T tack was then deployed in the lumen of  the stomach. This was brought up to tension at the skin surface. Over a guidewire, a 9-French sheath was advanced into the lumen of the stomach. The wire was left in place as a safety wire. A loop snare device from a percutaneous gastrostomy kit was then advanced into the stomach. A floppy guide wire was advanced through the orogastric catheter under fluoroscopy in the stomach. The loop snare advanced through the percutaneous gastric access was used to snare the guide wire. This allowed withdrawal of the loop snare out of the patient's mouth by retraction of the orogastric catheter and wire. A 20-French bumper retention gastrostomy tube was looped around the snare device. It was then pulled back through the patient's mouth. The retention bumper was brought up to the anterior gastric wall. The T tack suture was cut at the skin. The exiting gastrostomy tube was cut to appropriate length and a feeding adapter applied. The catheter was injected with contrast material to confirm position and a fluoroscopic spot image saved. The tube was then flushed with saline. A dressing was applied over the gastrostomy exit site. COMPLICATIONS: None. FINDINGS: The stomach distended well with air allowing safe placement of the gastrostomy tube. After placement, the tip of the gastrostomy tube lies in the body of the stomach. IMPRESSION: Percutaneous gastrostomy with placement of a 20-French bumper retention tube in the body of the stomach. This tube can be used for percutaneous feeds beginning in 24 hours after placement. Electronically Signed   By: Aletta Edouard M.D.   On: 07/28/2017 17:02    Assessment/Plan Principal Problem:   Acute on chronic respiratory failure with hypoxia (HCC) Active Problems:   Lobar pneumonia, unspecified organism [facility])   Coronary artery disease   Chronic atrial fibrillation ([place])   1. Acute on chronic respiratory failure with hypoxia patient is progressing nicely on weaning will continue to  advance T collar as ordered. 2. Lobar pneumonia clinically improving we will monitor 3. Coronary disease stable 4. Chronic atrial fibrillation rate is controlled   I have personally seen and evaluated the patient, evaluated laboratory and imaging results, formulated the assessment and plan and placed orders. The Patient requires high complexity decision making for assessment and support.  Case was discussed on Rounds with the Respiratory Therapy Staff  Allyne Gee, MD [facility] Pulmonary Critical Care Medicine Sleep Medicine

## 2017-07-30 NOTE — Progress Notes (Signed)
Referring Physician(s): [place]  Supervising Physician: Aletta Edouard  Patient Status:  [facility] - In-pt  Chief Complaint:  S/P Gtube  Subjective:  Pt lying in bed, trying to speak to me but trach in place. Asking for nurse. Denies pain in abdomen.  Allergies: Patient has no allergy information on record.  Medications: Prior to Admission medications   Not on File     Vital Signs: BP (!) 161/79   Pulse (!) 112   Resp 20   LMP  (LMP Unknown)   SpO2 95%   Physical Exam Awake and alert Trach in place Abdomen soft G-tube in place, tube feeds started, no leakage. Looks good.  Imaging: Ct Abdomen Wo Contrast  Result Date: 07/26/2017 CLINICAL DATA:  Dysphagia, malnutrition, assess for fluoroscopic gastrostomy insertion EXAM: CT ABDOMEN WITHOUT CONTRAST TECHNIQUE: Multidetector CT imaging of the abdomen was performed following the standard protocol without IV contrast. COMPARISON:  07/25/2017 FINDINGS: Lower chest: Non loculated pleural effusions present bilaterally, small on the right and larger on the left. There is associated left lower lobe collapse/consolidation. Minor right base atelectasis. Previous median sternotomy noted. Mild cardiomegaly. No pericardial effusion. NG tube within a nondilated esophagus entering the stomach. Hepatobiliary: No focal liver abnormality is seen. Status post cholecystectomy. No biliary dilatation. Pancreas: Mild atrophy of the pancreas. No ductal dilatation or focal abnormality. No surrounding inflammatory process or fluid collection. Spleen: Splenomegaly noted, measuring 20 cm in length. No focal splenic lesion. Adrenals/Urinary Tract: Mild nodular enlargement of the adrenal glands, nonspecific. Kidneys demonstrate nonspecific peri nephric strandy edema without obstruction or hydronephrosis. No hydroureter or obstructing ureteral calculus demonstrated. Bladder not imaged. Stomach/Bowel: Stomach is anteriorly positioned in the abdomen inferior to  the left hepatic lobe and superior to the colon. Anatomy appears favorable for fluoroscopic gastrostomy technique. Negative for bowel obstruction, significant dilatation, ileus, or free air. No fluid collection or abscess. Vascular/Lymphatic: Atherosclerosis of aorta. Negative for aneurysm. No retroperitoneal hemorrhage or hematoma. No adenopathy. Other: No abdominal wall hernia or abnormality. Musculoskeletal: Degenerative changes noted of the spine. Chronic appearing L3 and L5 compression fractures. IMPRESSION: Stomach anatomy is amenable to fluoroscopic percutaneous gastrostomy technique. Negative for significant hiatal hernia, bowel obstruction, or ileus. Pleural effusions present bilaterally, larger on the left with left lower lobe collapse/consolidation. Difficult to exclude pneumonia. Minor right base atelectasis. Remote cholecystectomy without biliary obstruction Splenomegaly Abdominal aortic atherosclerosis without aneurysm Electronically Signed   By: Jerilynn Mages.  Shick M.D.   On: 07/26/2017 12:43   Ir Gastrostomy Tube Mod Sed  Result Date: 07/28/2017 CLINICAL DATA:  Respiratory failure and need for gastrostomy tube for nutrition. EXAM: PERCUTANEOUS GASTROSTOMY TUBE PLACEMENT ANESTHESIA/SEDATION: 1.0 mg IV Versed; 50 mcg IV Fentanyl. Total Moderate Sedation Time 10 minutes. The patient's level of consciousness and physiologic status were continuously monitored during the procedure by Radiology nursing. CONTRAST:  35m ISOVUE-300 IOPAMIDOL (ISOVUE-300) INJECTION 61% MEDICATIONS: 1 g IV vancomycin. IV antibiotic was administered in an appropriate time interval prior to needle puncture of the skin. FLUOROSCOPY TIME:  1 minutes and 30 seconds.  10.1 mGy. PROCEDURE: The procedure, risks, benefits, and alternatives were explained to the patient's brother. Questions regarding the procedure were encouraged and answered. The patient's brother understands and consents to the procedure. A time-out was performed prior to  initiating the procedure. A 5-French catheter was then advanced through the patient's mouth under fluoroscopy into the esophagus and to the level of the stomach. This catheter was used to insufflate the stomach with air under fluoroscopy. The abdominal  wall was prepped with chlorhexidine in a sterile fashion, and a sterile drape was applied covering the operative field. A sterile gown and sterile gloves were used for the procedure. Local anesthesia was provided with 1% Lidocaine. A skin incision was made in the upper abdominal wall. Under fluoroscopy, an 18 gauge trocar needle was advanced into the stomach. Contrast injection was performed to confirm intraluminal position of the needle tip. A single T tack was then deployed in the lumen of the stomach. This was brought up to tension at the skin surface. Over a guidewire, a 9-French sheath was advanced into the lumen of the stomach. The wire was left in place as a safety wire. A loop snare device from a percutaneous gastrostomy kit was then advanced into the stomach. A floppy guide wire was advanced through the orogastric catheter under fluoroscopy in the stomach. The loop snare advanced through the percutaneous gastric access was used to snare the guide wire. This allowed withdrawal of the loop snare out of the patient's mouth by retraction of the orogastric catheter and wire. A 20-French bumper retention gastrostomy tube was looped around the snare device. It was then pulled back through the patient's mouth. The retention bumper was brought up to the anterior gastric wall. The T tack suture was cut at the skin. The exiting gastrostomy tube was cut to appropriate length and a feeding adapter applied. The catheter was injected with contrast material to confirm position and a fluoroscopic spot image saved. The tube was then flushed with saline. A dressing was applied over the gastrostomy exit site. COMPLICATIONS: None. FINDINGS: The stomach distended well with air  allowing safe placement of the gastrostomy tube. After placement, the tip of the gastrostomy tube lies in the body of the stomach. IMPRESSION: Percutaneous gastrostomy with placement of a 20-French bumper retention tube in the body of the stomach. This tube can be used for percutaneous feeds beginning in 24 hours after placement. Electronically Signed   By: Aletta Edouard M.D.   On: 07/28/2017 17:02    Labs:  CBC: Recent Labs    07/25/17 0705 07/27/17 0703 07/28/17 0641 07/30/17 0834  WBC 7.2 7.2 8.4 11.6*  HGB 8.2* 7.3* 7.9* 7.6*  HCT 26.0* 23.6* 25.9* 24.4*  PLT 180 184 203 206    COAGS: Recent Labs    07/15/17 0618 07/28/17 0641  INR 1.12 1.30    BMP: Recent Labs    07/25/17 0705 07/27/17 0703 07/28/17 0641 07/30/17 0834  NA 142 144 146* 146*  K 4.3 3.4* 4.1 3.4*  CL 104 107 109 110  CO2 '29 28 27 27  '$ GLUCOSE 128* 193* 170* 167*  BUN 37* 29* 29* 32*  CALCIUM 9.8 9.6 9.7 9.9  CREATININE 1.14* 1.19* 1.04* 0.93  GFRNONAA 52* 50* 58* >60  GFRAA >60 58* >60 >60    LIVER FUNCTION TESTS: Recent Labs    07/15/17 0618 07/30/17 0834  BILITOT 0.9  --   AST 17  --   ALT 15  --   ALKPHOS 69  --   PROT 6.9  --   ALBUMIN 2.7* 2.4*    Assessment and Plan:  S/P Gastrostomy tube placement on 07/28/17.  Routine G-tube care.  Electronically Signed: Murrell Redden, PA-C 07/30/2017, 12:15 PM    I spent a total of 15 Minutes at the the patient's bedside AND on the patient's hospital floor or unit, greater than 50% of which was counseling/coordinating care for f/u G-tube.

## 2017-07-31 DIAGNOSIS — I2510 Atherosclerotic heart disease of native coronary artery without angina pectoris: Secondary | ICD-10-CM | POA: Diagnosis not present

## 2017-07-31 DIAGNOSIS — J181 Lobar pneumonia, unspecified organism: Secondary | ICD-10-CM | POA: Diagnosis not present

## 2017-07-31 DIAGNOSIS — I482 Chronic atrial fibrillation: Secondary | ICD-10-CM | POA: Diagnosis not present

## 2017-07-31 DIAGNOSIS — J9621 Acute and chronic respiratory failure with hypoxia: Secondary | ICD-10-CM | POA: Diagnosis not present

## 2017-07-31 LAB — POTASSIUM: Potassium: 4.1 mmol/L (ref 3.5–5.1)

## 2017-07-31 NOTE — Progress Notes (Signed)
[facility]   PULMONARY SERVICE  PROGRESS NOTE  Date of Service: 07/31/2017  Martha BustleGlenda W Ambrosio  ZOX:[id number]:[id number]  DOB: 1960-12-22   DOA: 07/25/2017  Referring Physician: Carron CurieAli Hijazi, MD  HPI: Martha Carter is a 57 y.o. female seen for follow up of Acute on Chronic Respiratory Failure.  She has completed 24 hours off the ventilator the goal is now for 48 hours.  Looks good remains on 28% FiO2  Medications: Reviewed on Rounds  Physical Exam:  Vitals: Temperature 97.6 pulse 90 respiratory rate 20 blood pressure 152/75 saturations 97%  Ventilator Settings T collar trials  . General: Comfortable at this time . Eyes: Grossly normal lids, irises & conjunctiva . ENT: grossly tongue is normal . Neck: no obvious mass . Cardiovascular: S1 S2 normal no gallop . Respiratory: No rhonchi or rales are noted . Abdomen: soft . Skin: no rash seen on limited exam . Musculoskeletal: not rigid . Psychiatric:unable to assess . Neurologic: no seizure no involuntary movements         Lab Data:   Basic Metabolic Panel: Recent Labs  Lab 07/25/17 0705 07/27/17 0703 07/28/17 0641 07/30/17 0834 07/31/17 0632  NA 142 144 146* 146*  --   K 4.3 3.4* 4.1 3.4* 4.1  CL 104 107 109 110  --   CO2 29 28 27 27   --   GLUCOSE 128* 193* 170* 167*  --   BUN 37* 29* 29* 32*  --   CREATININE 1.14* 1.19* 1.04* 0.93  --   CALCIUM 9.8 9.6 9.7 9.9  --   MG 2.8* 2.4  --  2.2  --   PHOS 4.3 2.7  --  2.7  --     Liver Function Tests: Recent Labs  Lab 07/30/17 0834  ALBUMIN 2.4*   No results for input(s): LIPASE, AMYLASE in the last 168 hours. No results for input(s): AMMONIA in the last 168 hours.  CBC: Recent Labs  Lab 07/25/17 0705 07/27/17 0703 07/28/17 0641 07/30/17 0834  WBC 7.2 7.2 8.4 11.6*  HGB 8.2* 7.3* 7.9* 7.6*  HCT 26.0* 23.6* 25.9* 24.4*  MCV 92.5 92.9 94.5 92.1  PLT 180 184 203 206    Cardiac Enzymes: No results for  input(s): CKTOTAL, CKMB, CKMBINDEX, TROPONINI in the last 168 hours.  BNP (last 3 results) No results for input(s): BNP in the last 8760 hours.  ProBNP (last 3 results) No results for input(s): PROBNP in the last 8760 hours.  Radiological Exams: No results found.  Assessment/Plan Principal Problem:   Acute on chronic respiratory failure with hypoxia (HCC) Active Problems:   Lobar pneumonia, unspecified organism [facility](HCC)   Coronary artery disease   Chronic atrial fibrillation (HCC)   1. Acute on chronic respiratory failure with hypoxia we will continue with weaning as mentioned above the goal is 24 hours continue supportive care titrate oxygen as tolerated 2. Lobar pneumonia treated we will continue to follow 3. Chronic atrial fibrillation rate is controlled 4. Coronary artery disease asymptomatic   I have personally seen and evaluated the patient, evaluated laboratory and imaging results, formulated the assessment and plan and placed orders. The Patient requires high complexity decision making for assessment and support.  Case was discussed on Rounds with the Respiratory Therapy Staff  Yevonne PaxSaadat A Bama Hanselman, MD [facility] Pulmonary Critical Care Medicine Sleep Medicine

## 2017-08-01 DIAGNOSIS — J9621 Acute and chronic respiratory failure with hypoxia: Secondary | ICD-10-CM | POA: Diagnosis not present

## 2017-08-01 DIAGNOSIS — J181 Lobar pneumonia, unspecified organism: Secondary | ICD-10-CM | POA: Diagnosis not present

## 2017-08-01 DIAGNOSIS — I2510 Atherosclerotic heart disease of native coronary artery without angina pectoris: Secondary | ICD-10-CM | POA: Diagnosis not present

## 2017-08-01 DIAGNOSIS — I482 Chronic atrial fibrillation: Secondary | ICD-10-CM | POA: Diagnosis not present

## 2017-08-01 LAB — BASIC METABOLIC PANEL
ANION GAP: 9 (ref 5–15)
BUN: 44 mg/dL — ABNORMAL HIGH (ref 6–20)
CALCIUM: 9.7 mg/dL (ref 8.9–10.3)
CO2: 24 mmol/L (ref 22–32)
Chloride: 111 mmol/L (ref 98–111)
Creatinine, Ser: 0.92 mg/dL (ref 0.44–1.00)
GFR calc Af Amer: >60 mL/min (ref >60)
GFR calc non Af Amer: >60 mL/min (ref >60)
Glucose, Bld: 215 mg/dL — ABNORMAL HIGH (ref 70–99)
Potassium: 4.9 mmol/L (ref 3.5–5.1)
Sodium: 144 mmol/L (ref 135–145)

## 2017-08-01 NOTE — Progress Notes (Signed)
[facility]   PULMONARY SERVICE  PROGRESS NOTE  Date of Service: 08/01/2017  Martha Carter  ZOX:[id number]  DOB: May 18, 1960   DOA: 07/25/2017  Referring Physician: Carron CurieAli Hijazi, MD  HPI: Martha Carter is a 57 y.o. female seen for follow up of Acute on Chronic Respiratory Failure.  Comfortable without distress at this time.  Patient remains on her/T collar has been off the ventilator for more than 48 hours now.  Medications: Reviewed on Rounds  Physical Exam:  Vitals: Temperature 97.6 pulse 101 respiratory rate 15 blood pressure 148/69 saturations 97%  Ventilator Settings currently is on T collar FiO2 28%  . General: Comfortable at this time . Eyes: Grossly normal lids, irises & conjunctiva . ENT: grossly tongue is normal . Neck: no obvious mass . Cardiovascular: S1 S2 normal no gallop . Respiratory: No rhonchi or rales are noted at this time . Abdomen: soft . Skin: no rash seen on limited exam . Musculoskeletal: not rigid . Psychiatric:unable to assess . Neurologic: no seizure no involuntary movements         Lab Data:   Basic Metabolic Panel: Recent Labs  Lab 07/27/17 0703 07/28/17 0641 07/30/17 0834 07/31/17 0632 08/01/17 0636  NA 144 146* 146*  --  144  K 3.4* 4.1 3.4* 4.1 4.9  CL 107 109 110  --  111  CO2 28 27 27   --  24  GLUCOSE 193* 170* 167*  --  215*  BUN 29* 29* 32*  --  44*  CREATININE 1.19* 1.04* 0.93  --  0.92  CALCIUM 9.6 9.7 9.9  --  9.7  MG 2.4  --  2.2  --   --   PHOS 2.7  --  2.7  --   --     Liver Function Tests: Recent Labs  Lab 07/30/17 0834  ALBUMIN 2.4*   No results for input(s): LIPASE, AMYLASE in the last 168 hours. No results for input(s): AMMONIA in the last 168 hours.  CBC: Recent Labs  Lab 07/27/17 0703 07/28/17 0641 07/30/17 0834  WBC 7.2 8.4 11.6*  HGB 7.3* 7.9* 7.6*  HCT 23.6* 25.9* 24.4*  MCV 92.9 94.5 92.1  PLT 184 203 206    Cardiac Enzymes: No  results for input(s): CKTOTAL, CKMB, CKMBINDEX, TROPONINI in the last 168 hours.  BNP (last 3 results) No results for input(s): BNP in the last 8760 hours.  ProBNP (last 3 results) No results for input(s): PROBNP in the last 8760 hours.  Radiological Exams: No results found.  Assessment/Plan Principal Problem:   Acute on chronic respiratory failure with hypoxia (HCC) Active Problems:   Lobar pneumonia, unspecified organism [facility](HCC)   Coronary artery disease   Chronic atrial fibrillation (HCC)   1. Acute on chronic respiratory failure with hypoxia patient is doing fairly well right now on the T collar wean.  Has been off the ventilator for more than 48 hours.  Continue to advance hopefully will be able to change the trach over and then start capping trials. 2. Lobar pneumonia treated clinically improved we will continue to monitor 3. Chronic atrial fibrillation rate is controlled continue present management. 4. Coronary artery disease no active chest pain noted patient stable   I have personally seen and evaluated the patient, evaluated laboratory and imaging results, formulated the assessment and plan and placed orders. The Patient requires high complexity decision making for assessment and support.  Case was discussed on Rounds with the Respiratory  Therapy Staff  Allyne Gee, MD [facility] Pulmonary Critical Care Medicine Sleep Medicine

## 2017-08-02 ENCOUNTER — Other Ambulatory Visit (HOSPITAL_COMMUNITY): Payer: Self-pay

## 2017-08-02 DIAGNOSIS — I482 Chronic atrial fibrillation: Secondary | ICD-10-CM | POA: Diagnosis not present

## 2017-08-02 DIAGNOSIS — I2510 Atherosclerotic heart disease of native coronary artery without angina pectoris: Secondary | ICD-10-CM | POA: Diagnosis not present

## 2017-08-02 DIAGNOSIS — J181 Lobar pneumonia, unspecified organism: Secondary | ICD-10-CM | POA: Diagnosis not present

## 2017-08-02 DIAGNOSIS — J9621 Acute and chronic respiratory failure with hypoxia: Secondary | ICD-10-CM | POA: Diagnosis not present

## 2017-08-02 NOTE — Progress Notes (Signed)
[facility]   PULMONARY SERVICE  PROGRESS NOTE  Date of Service: 08/02/2017  Laurina BustleGlenda W Mericle  ZOX:[id number]:[id number]  DOB: October 17, 1960   DOA: 07/25/2017  Referring Physician: Carron CurieAli Hijazi, MD  HPI: Laurina BustleGlenda W Maret is a 57 y.o. female seen for follow up of Acute on Chronic Respiratory Failure.  Patient is on T collar has been on 28% FiO2.  Off the ventilator for more than 72 hours.  Patient is tolerating the PMV.  Scheduled for having a swallowing assessment done  Medications: Reviewed on Rounds  Physical Exam:  Vitals: Temperature 96.9 pulse 100 respiratory rate 25 blood pressure 146/65 saturations 98%  Ventilator Settings off the ventilator on T collar  . General: Comfortable at this time . Eyes: Grossly normal lids, irises & conjunctiva . ENT: grossly tongue is normal . Neck: no obvious mass . Cardiovascular: S1 S2 normal no gallop . Respiratory: No rhonchi or rales are noted . Abdomen: soft . Skin: no rash seen on limited exam . Musculoskeletal: not rigid . Psychiatric:unable to assess . Neurologic: no seizure no involuntary movements         Lab Data:   Basic Metabolic Panel: Recent Labs  Lab 07/27/17 0703 07/28/17 0641 07/30/17 0834 07/31/17 0632 08/01/17 0636  NA 144 146* 146*  --  144  K 3.4* 4.1 3.4* 4.1 4.9  CL 107 109 110  --  111  CO2 28 27 27   --  24  GLUCOSE 193* 170* 167*  --  215*  BUN 29* 29* 32*  --  44*  CREATININE 1.19* 1.04* 0.93  --  0.92  CALCIUM 9.6 9.7 9.9  --  9.7  MG 2.4  --  2.2  --   --   PHOS 2.7  --  2.7  --   --     Liver Function Tests: Recent Labs  Lab 07/30/17 0834  ALBUMIN 2.4*   No results for input(s): LIPASE, AMYLASE in the last 168 hours. No results for input(s): AMMONIA in the last 168 hours.  CBC: Recent Labs  Lab 07/27/17 0703 07/28/17 0641 07/30/17 0834  WBC 7.2 8.4 11.6*  HGB 7.3* 7.9* 7.6*  HCT 23.6* 25.9* 24.4*  MCV 92.9 94.5 92.1  PLT 184 203 206     Cardiac Enzymes: No results for input(s): CKTOTAL, CKMB, CKMBINDEX, TROPONINI in the last 168 hours.  BNP (last 3 results) No results for input(s): BNP in the last 8760 hours.  ProBNP (last 3 results) No results for input(s): PROBNP in the last 8760 hours.  Radiological Exams: No results found.  Assessment/Plan Principal Problem:   Acute on chronic respiratory failure with hypoxia (HCC) Active Problems:   Lobar pneumonia, unspecified organism [facility]HCC)   Coronary artery disease   Chronic atrial fibrillation (HCC)   1. Acute on chronic respiratory failure with hypoxia we will continue with weaning on T collar as already noted above patient is scheduled for a swallowing assessment. 2. Lobar pneumonia treated with antibiotics we will continue present management. 3. Coronary artery disease stable we will continue to follow 4. Chronic atrial fibrillation rate is controlled we will continue present management.   I have personally seen and evaluated the patient, evaluated laboratory and imaging results, formulated the assessment and plan and placed orders. The Patient requires high complexity decision making for assessment and support.  Case was discussed on Rounds with the Respiratory Therapy Staff  Yevonne PaxSaadat A Torianna Junio, MD [facility] Pulmonary Critical Care Medicine Sleep Medicine

## 2017-08-03 DIAGNOSIS — I482 Chronic atrial fibrillation: Secondary | ICD-10-CM | POA: Diagnosis not present

## 2017-08-03 DIAGNOSIS — J181 Lobar pneumonia, unspecified organism: Secondary | ICD-10-CM | POA: Diagnosis not present

## 2017-08-03 DIAGNOSIS — J9621 Acute and chronic respiratory failure with hypoxia: Secondary | ICD-10-CM | POA: Diagnosis not present

## 2017-08-03 DIAGNOSIS — I2510 Atherosclerotic heart disease of native coronary artery without angina pectoris: Secondary | ICD-10-CM | POA: Diagnosis not present

## 2017-08-03 NOTE — Progress Notes (Signed)
[facility]   PULMONARY SERVICE  PROGRESS NOTE  Date of Service: 08/03/2017  Martha Carter  ZDG:[id number]  DOB: 13-Nov-1960   DOA: 07/25/2017  Referring Physician: Carron CurieAli Hijazi, MD  HPI: Martha Carter is a 57 y.o. female seen for follow up of Acute on Chronic Respiratory Failure.  Comfortable without distress at this time.  Patient is on T collar Raynaud's on 28% FiO2 with the PMV in place.  Patient's been off the ventilator for more than 48 hours doing fairly well.  Also had a swallowing study done and was able to pass that  Medications: Reviewed on Rounds  Physical Exam:  Vitals: Temperature 97.6 pulse 97 respiratory rate 18 blood pressure 131/68 saturations 100%  Ventilator Settings off the ventilator on T collar trials  . General: Comfortable at this time . Eyes: Grossly normal lids, irises & conjunctiva . ENT: grossly tongue is normal . Neck: no obvious mass . Cardiovascular: S1 S2 normal no gallop . Respiratory: No rhonchi or rales . Abdomen: soft . Skin: no rash seen on limited exam . Musculoskeletal: not rigid . Psychiatric:unable to assess . Neurologic: no seizure no involuntary movements         Lab Data:   Basic Metabolic Panel: Recent Labs  Lab 07/28/17 0641 07/30/17 0834 07/31/17 0632 08/01/17 0636  NA 146* 146*  --  144  K 4.1 3.4* 4.1 4.9  CL 109 110  --  111  CO2 27 27  --  24  GLUCOSE 170* 167*  --  215*  BUN 29* 32*  --  44*  CREATININE 1.04* 0.93  --  0.92  CALCIUM 9.7 9.9  --  9.7  MG  --  2.2  --   --   PHOS  --  2.7  --   --     Liver Function Tests: Recent Labs  Lab 07/30/17 0834  ALBUMIN 2.4*   No results for input(s): LIPASE, AMYLASE in the last 168 hours. No results for input(s): AMMONIA in the last 168 hours.  CBC: Recent Labs  Lab 07/28/17 0641 07/30/17 0834  WBC 8.4 11.6*  HGB 7.9* 7.6*  HCT 25.9* 24.4*  MCV 94.5 92.1  PLT 203 206    Cardiac Enzymes: No  results for input(s): CKTOTAL, CKMB, CKMBINDEX, TROPONINI in the last 168 hours.  BNP (last 3 results) No results for input(s): BNP in the last 8760 hours.  ProBNP (last 3 results) No results for input(s): PROBNP in the last 8760 hours.  Radiological Exams: No results found.  Assessment/Plan Principal Problem:   Acute on chronic respiratory failure with hypoxia (HCC) Active Problems:   Lobar pneumonia, unspecified organism [facility](HCC)   Coronary artery disease   Chronic atrial fibrillation (HCC)   1. Acute on chronic respiratory failure with hypoxia we will continue with T collar and PMV hopefully we should be able to start capping soon. 2. Lobar pneumonia treated with antibiotics we will continue to follow 3. Coronary disease at baseline 4. Chronic atrial fibrillation rate is controlled   I have personally seen and evaluated the patient, evaluated laboratory and imaging results, formulated the assessment and plan and placed orders. The Patient requires high complexity decision making for assessment and support.  Case was discussed on Rounds with the Respiratory Therapy Staff  Yevonne PaxSaadat A Kingsley Farace, MD [facility] Pulmonary Critical Care Medicine Sleep Medicine

## 2017-08-04 DIAGNOSIS — J9621 Acute and chronic respiratory failure with hypoxia: Secondary | ICD-10-CM | POA: Diagnosis not present

## 2017-08-04 DIAGNOSIS — J181 Lobar pneumonia, unspecified organism: Secondary | ICD-10-CM | POA: Diagnosis not present

## 2017-08-04 DIAGNOSIS — I2510 Atherosclerotic heart disease of native coronary artery without angina pectoris: Secondary | ICD-10-CM | POA: Diagnosis not present

## 2017-08-04 DIAGNOSIS — I482 Chronic atrial fibrillation: Secondary | ICD-10-CM | POA: Diagnosis not present

## 2017-08-04 NOTE — Progress Notes (Signed)
[facility]   PULMONARY SERVICE  PROGRESS NOTE  Date of Service: 08/04/2017  Martha Carter  EAV:[id number]:[id number]  DOB: 04-Jun-1960   DOA: 07/25/2017  Referring Physician: Carron CurieAli Hijazi, MD  HPI: Martha Carter is a 57 y.o. female seen for follow up of Acute on Chronic Respiratory Failure.  Patient is on T collar currently on 20% FiO2 has a #6 cuffless trach in place.  Medications: Reviewed on Rounds  Physical Exam:  Vitals: Temperature 98.7 pulse 89 respiratory 24 blood pressure 122/63 saturations 98%  Ventilator Settings currently on T collar FiO2 28%  . General: Comfortable at this time . Eyes: Grossly normal lids, irises & conjunctiva . ENT: grossly tongue is normal . Neck: no obvious mass . Cardiovascular: S1 S2 normal no gallop . Respiratory: No rhonchi or rales are noted . Abdomen: soft . Skin: no rash seen on limited exam . Musculoskeletal: not rigid . Psychiatric:unable to assess . Neurologic: no seizure no involuntary movements         Lab Data:   Basic Metabolic Panel: Recent Labs  Lab 07/30/17 0834 07/31/17 0632 08/01/17 0636  NA 146*  --  144  K 3.4* 4.1 4.9  CL 110  --  111  CO2 27  --  24  GLUCOSE 167*  --  215*  BUN 32*  --  44*  CREATININE 0.93  --  0.92  CALCIUM 9.9  --  9.7  MG 2.2  --   --   PHOS 2.7  --   --     Liver Function Tests: Recent Labs  Lab 07/30/17 0834  ALBUMIN 2.4*   No results for input(s): LIPASE, AMYLASE in the last 168 hours. No results for input(s): AMMONIA in the last 168 hours.  CBC: Recent Labs  Lab 07/30/17 0834  WBC 11.6*  HGB 7.6*  HCT 24.4*  MCV 92.1  PLT 206    Cardiac Enzymes: No results for input(s): CKTOTAL, CKMB, CKMBINDEX, TROPONINI in the last 168 hours.  BNP (last 3 results) No results for input(s): BNP in the last 8760 hours.  ProBNP (last 3 results) No results for input(s): PROBNP in the last 8760 hours.  Radiological Exams: No  results found.  Assessment/Plan Principal Problem:   Acute on chronic respiratory failure with hypoxia (HCC) Active Problems:   Lobar pneumonia, unspecified organism [facility](HCC)   Coronary artery disease   Chronic atrial fibrillation (HCC)   1. Acute on chronic respiratory failure with hypoxia we will continue with T collar as tolerated continue with pulmonary toilet supportive care 2. Lobar pneumonia treated with antibiotics 3. Coronary artery disease stable 4. Chronic atrial fibrillation rate is controlled   I have personally seen and evaluated the patient, evaluated laboratory and imaging results, formulated the assessment and plan and placed orders. The Patient requires high complexity decision making for assessment and support.  Case was discussed on Rounds with the Respiratory Therapy Staff  Yevonne PaxSaadat A Khan, MD [facility] Pulmonary Critical Care Medicine Sleep Medicine

## 2017-08-05 DIAGNOSIS — I482 Chronic atrial fibrillation: Secondary | ICD-10-CM | POA: Diagnosis not present

## 2017-08-05 DIAGNOSIS — I2510 Atherosclerotic heart disease of native coronary artery without angina pectoris: Secondary | ICD-10-CM | POA: Diagnosis not present

## 2017-08-05 DIAGNOSIS — J9621 Acute and chronic respiratory failure with hypoxia: Secondary | ICD-10-CM | POA: Diagnosis not present

## 2017-08-05 DIAGNOSIS — J181 Lobar pneumonia, unspecified organism: Secondary | ICD-10-CM | POA: Diagnosis not present

## 2017-08-05 LAB — BASIC METABOLIC PANEL
Anion gap: 10 (ref 5–15)
BUN: 46 mg/dL — ABNORMAL HIGH (ref 6–20)
CO2: 28 mmol/L (ref 22–32)
Calcium: 10 mg/dL (ref 8.9–10.3)
Chloride: 101 mmol/L (ref 98–111)
Creatinine, Ser: 0.94 mg/dL (ref 0.44–1.00)
GFR calc Af Amer: >60 mL/min (ref >60)
GFR calc non Af Amer: >60 mL/min (ref >60)
Glucose, Bld: 141 mg/dL — ABNORMAL HIGH (ref 70–99)
POTASSIUM: 4.3 mmol/L (ref 3.5–5.1)
SODIUM: 139 mmol/L (ref 135–145)

## 2017-08-05 LAB — MAGNESIUM: MAGNESIUM: 2.5 mg/dL — AB (ref 1.7–2.4)

## 2017-08-05 LAB — CBC
HCT: 24.3 % — ABNORMAL LOW (ref 36.0–46.0)
HEMOGLOBIN: 7.5 g/dL — AB (ref 12.0–15.0)
MCH: 28 pg (ref 26.0–34.0)
MCHC: 30.9 g/dL (ref 30.0–36.0)
MCV: 90.7 fL (ref 78.0–100.0)
Platelets: 249 10*3/uL (ref 150–400)
RBC: 2.68 MIL/uL — AB (ref 3.87–5.11)
RDW: 17.3 % — ABNORMAL HIGH (ref 11.5–15.5)
WBC: 9.3 10*3/uL (ref 4.0–10.5)

## 2017-08-05 NOTE — Progress Notes (Signed)
[facility]ulmonary [facility]   PULMONARY SERVICE  PROGRESS NOTE  Date of Service: 08/05/2017  Martha BustleGlenda W Deist  OZH:[id number]  DOB: 24-Jun-1960   DOA: 07/25/2017  Referring Physician: Carron CurieAli Hijazi, MD  HPI: Martha BustleGlenda W Stark is a 57 y.o. female seen for follow up of Acute on Chronic Respiratory Failure.  Patient is on T collar currently 28% FiO2.  Has been tolerating PMV fairly well.  She is still somewhat confused  Medications: Reviewed on Rounds  Physical Exam:  Vitals: Temperature 97.0 pulse 78 respiratory rate 19 blood pressure 96/89 saturations 99%  Ventilator Settings on T collar FiO2 28%  . General: Comfortable at this time . Eyes: Grossly normal lids, irises & conjunctiva . ENT: grossly tongue is normal . Neck: no obvious mass . Cardiovascular: S1 S2 normal no gallop . Respiratory: No rhonchi or rales are noted . Abdomen: soft . Skin: no rash seen on limited exam . Musculoskeletal: not rigid . Psychiatric:unable to assess . Neurologic: no seizure no involuntary movements         Lab Data:   Basic Metabolic Panel: Recent Labs  Lab 07/30/17 0834 07/31/17 0632 08/01/17 0636 08/05/17 0615  NA 146*  --  144 139  K 3.4* 4.1 4.9 4.3  CL 110  --  111 101  CO2 27  --  24 28  GLUCOSE 167*  --  215* 141*  BUN 32*  --  44* 46*  CREATININE 0.93  --  0.92 0.94  CALCIUM 9.9  --  9.7 10.0  MG 2.2  --   --  2.5*  PHOS 2.7  --   --   --     Liver Function Tests: Recent Labs  Lab 07/30/17 0834  ALBUMIN 2.4*   No results for input(s): LIPASE, AMYLASE in the last 168 hours. No results for input(s): AMMONIA in the last 168 hours.  CBC: Recent Labs  Lab 07/30/17 0834 08/05/17 0615  WBC 11.6* 9.3  HGB 7.6* 7.5*  HCT 24.4* 24.3*  MCV 92.1 90.7  PLT 206 249    Cardiac Enzymes: No results for input(s): CKTOTAL, CKMB, CKMBINDEX, TROPONINI in the last 168 hours.  BNP (last 3 results) No results for input(s): BNP in the  last 8760 hours.  ProBNP (last 3 results) No results for input(s): PROBNP in the last 8760 hours.  Radiological Exams: No results found.  Assessment/Plan Principal Problem:   Acute on chronic respiratory failure with hypoxia (HCC) Active Problems:   Lobar pneumonia, unspecified organism [facility](HCC)   Coronary artery disease   Chronic atrial fibrillation (HCC)   1. Acute on chronic respiratory failure with hypoxia patient is on T collar will continue with pulmonary toilet supportive care continue with PMV as ordered. 2. Lobar pneumonia unspecified we will continue present management 3. Coronary disease at baseline 4. Chronic atrial fibrillation rate is controlled we will continue to follow   I have personally seen and evaluated the patient, evaluated laboratory and imaging results, formulated the assessment and plan and placed orders. The Patient requires high complexity decision making for assessment and support.  Case was discussed on Rounds with the Respiratory Therapy Staff  Yevonne PaxSaadat A Khan, MD [facility] Pulmonary Critical Care Medicine Sleep Medicine

## 2017-08-06 DIAGNOSIS — I482 Chronic atrial fibrillation: Secondary | ICD-10-CM | POA: Diagnosis not present

## 2017-08-06 DIAGNOSIS — I2510 Atherosclerotic heart disease of native coronary artery without angina pectoris: Secondary | ICD-10-CM | POA: Diagnosis not present

## 2017-08-06 DIAGNOSIS — J9621 Acute and chronic respiratory failure with hypoxia: Secondary | ICD-10-CM | POA: Diagnosis not present

## 2017-08-06 DIAGNOSIS — J181 Lobar pneumonia, unspecified organism: Secondary | ICD-10-CM | POA: Diagnosis not present

## 2017-08-06 NOTE — Progress Notes (Signed)
[facility]UL[facility] SERVICE  PROGRESS NOTE  Date of Service: 08/06/2017  Martha BustleGlenda W Carter  WGN:[id number]  DOB: 1960/01/30   DOA: 07/25/2017  Referring Physician: Carron CurieAli Hijazi, MD  HPI: Martha Carter is a 57 y.o. female seen for follow up of Acute on Chronic Respiratory Failure.  Patient is on T collar comfortable without distress at this time.  Currently on 20% FiO2  Medications: Reviewed on Rounds  Physical Exam:  Vitals: Temperature 97.4 pulse 87 respiratory 22 blood pressure 141/74 saturations 100%  Ventilator Settings off the ventilator on 10 collar  . General: Comfortable at this time . Eyes: Grossly normal lids, irises & conjunctiva . ENT: grossly tongue is normal . Neck: no obvious mass . Cardiovascular: S1 S2 normal no gallop . Respiratory: No rhonchi or rales . Abdomen: soft . Skin: no rash seen on limited exam . Musculoskeletal: not rigid . Psychiatric:unable to assess . Neurologic: no seizure no involuntary movements         Lab Data:   Basic Metabolic Panel: Recent Labs  Lab 07/31/17 0632 08/01/17 0636 08/05/17 0615  NA  --  144 139  K 4.1 4.9 4.3  CL  --  111 101  CO2  --  24 28  GLUCOSE  --  215* 141*  BUN  --  44* 46*  CREATININE  --  0.92 0.94  CALCIUM  --  9.7 10.0  MG  --   --  2.5*    Liver Function Tests: No results for input(s): AST, ALT, ALKPHOS, BILITOT, PROT, ALBUMIN in the last 168 hours. No results for input(s): LIPASE, AMYLASE in the last 168 hours. No results for input(s): AMMONIA in the last 168 hours.  CBC: Recent Labs  Lab 08/05/17 0615  WBC 9.3  HGB 7.5*  HCT 24.3*  MCV 90.7  PLT 249    Cardiac Enzymes: No results for input(s): CKTOTAL, CKMB, CKMBINDEX, TROPONINI in the last 168 hours.  BNP (last 3 results) No results for input(s): BNP in the last 8760 hours.  ProBNP (last 3 results) No results for input(s): PROBNP in the last 8760  hours.  Radiological Exams: No results found.  Assessment/Plan Principal Problem:   Acute on chronic respiratory failure with hypoxia (HCC) Active Problems:   Lobar pneumonia, unspecified organism [facility](HCC)   Coronary artery disease   Chronic atrial fibrillation (HCC)   1. Acute on chronic respiratory failure with hypoxia patient is comfortable tolerating the T collar fairly well.  Will continue T collar continue secretion management pulmonary toilet 2. Lobar pneumonia treated with antibiotics we will continue to follow. 3. Coronary artery disease at baseline 4. Chronic atrial fibrillation rate is controlled   I have personally seen and evaluated the patient, evaluated laboratory and imaging results, formulated the assessment and plan and placed orders. The Patient requires high complexity decision making for assessment and support.  Case was discussed on Rounds with the Respiratory Therapy Staff  Yevonne PaxSaadat A Tyger Oka, MD [facility] Pulmonary Critical Care Medicine Sleep Medicine

## 2017-08-07 DIAGNOSIS — J9621 Acute and chronic respiratory failure with hypoxia: Secondary | ICD-10-CM | POA: Diagnosis not present

## 2017-08-07 DIAGNOSIS — I2510 Atherosclerotic heart disease of native coronary artery without angina pectoris: Secondary | ICD-10-CM | POA: Diagnosis not present

## 2017-08-07 DIAGNOSIS — J181 Lobar pneumonia, unspecified organism: Secondary | ICD-10-CM | POA: Diagnosis not present

## 2017-08-07 DIAGNOSIS — I482 Chronic atrial fibrillation: Secondary | ICD-10-CM | POA: Diagnosis not present

## 2017-08-07 NOTE — Progress Notes (Signed)
[facility]   PULMONARY SERVICE  PROGRESS NOTE  Date of Service: 08/07/2017  Martha BustleGlenda W Carter  GEX:[id number]  DOB: 07/12/1960   DOA: 07/25/2017  Referring Physician: Carron CurieAli Hijazi, MD  HPI: Martha Carter is a 57 y.o. female seen for follow up of Acute on Chronic Respiratory Failure.  Currently is on T collar has been on 28% FiO2.  Has not yet been capped so we will try to advance to capping trials today  Medications: Reviewed on Rounds  Physical Exam:  Vitals: Temperature is 97.5 pulse 98 respiratory rate 24 blood pressure 150/80 saturations 100%  Ventilator Settings off the ventilator on T collar  . General: Comfortable at this time . Eyes: Grossly normal lids, irises & conjunctiva . ENT: grossly tongue is normal . Neck: no obvious mass . Cardiovascular: S1 S2 normal no gallop . Respiratory: Coarse breath sounds no rhonchi are noted . Abdomen: soft . Skin: no rash seen on limited exam . Musculoskeletal: not rigid . Psychiatric:unable to assess . Neurologic: no seizure no involuntary movements         Lab Data:   Basic Metabolic Panel: Recent Labs  Lab 08/01/17 0636 08/05/17 0615  NA 144 139  K 4.9 4.3  CL 111 101  CO2 24 28  GLUCOSE 215* 141*  BUN 44* 46*  CREATININE 0.92 0.94  CALCIUM 9.7 10.0  MG  --  2.5*    Liver Function Tests: No results for input(s): AST, ALT, ALKPHOS, BILITOT, PROT, ALBUMIN in the last 168 hours. No results for input(s): LIPASE, AMYLASE in the last 168 hours. No results for input(s): AMMONIA in the last 168 hours.  CBC: Recent Labs  Lab 08/05/17 0615  WBC 9.3  HGB 7.5*  HCT 24.3*  MCV 90.7  PLT 249    Cardiac Enzymes: No results for input(s): CKTOTAL, CKMB, CKMBINDEX, TROPONINI in the last 168 hours.  BNP (last 3 results) No results for input(s): BNP in the last 8760 hours.  ProBNP (last 3 results) No results for input(s): PROBNP in the last 8760  hours.  Radiological Exams: No results found.  Assessment/Plan Principal Problem:   Acute on chronic respiratory failure with hypoxia (HCC) Active Problems:   Lobar pneumonia, unspecified organism [facility](HCC)   Coronary artery disease   Chronic atrial fibrillation (HCC)   1. Acute on chronic respiratory failure with hypoxia patient is doing fine on T collar as noted we will try to do capping trials today.  Titrate oxygen as tolerated continue with aggressive pulmonary toilet secretion management.  Secretions have been reportedly minimal. 2. Lobar pneumonia treated with antibiotics we will continue to follow. 3. Coronary artery disease stable 4. Chronic atrial fibrillation rate is controlled   I have personally seen and evaluated the patient, evaluated laboratory and imaging results, formulated the assessment and plan and placed orders. The Patient requires high complexity decision making for assessment and support.  Case was discussed on Rounds with the Respiratory Therapy Staff  Yevonne PaxSaadat A Emina Ribaudo, MD [facility] Pulmonary Critical Care Medicine Sleep Medicine

## 2017-08-08 DIAGNOSIS — I2510 Atherosclerotic heart disease of native coronary artery without angina pectoris: Secondary | ICD-10-CM | POA: Diagnosis not present

## 2017-08-08 DIAGNOSIS — I482 Chronic atrial fibrillation: Secondary | ICD-10-CM | POA: Diagnosis not present

## 2017-08-08 DIAGNOSIS — J181 Lobar pneumonia, unspecified organism: Secondary | ICD-10-CM | POA: Diagnosis not present

## 2017-08-08 DIAGNOSIS — J9621 Acute and chronic respiratory failure with hypoxia: Secondary | ICD-10-CM | POA: Diagnosis not present

## 2017-08-08 LAB — CBC
HCT: 21.3 % — ABNORMAL LOW (ref 36.0–46.0)
HEMOGLOBIN: 6.8 g/dL — AB (ref 12.0–15.0)
MCH: 28.2 pg (ref 26.0–34.0)
MCHC: 31.9 g/dL (ref 30.0–36.0)
MCV: 88.4 fL (ref 78.0–100.0)
PLATELETS: 222 10*3/uL (ref 150–400)
RBC: 2.41 MIL/uL — AB (ref 3.87–5.11)
RDW: 18.4 % — ABNORMAL HIGH (ref 11.5–15.5)
WBC: 10.2 10*3/uL (ref 4.0–10.5)

## 2017-08-08 LAB — MAGNESIUM: Magnesium: 2.3 mg/dL (ref 1.7–2.4)

## 2017-08-08 LAB — BASIC METABOLIC PANEL
Anion gap: 12 (ref 5–15)
BUN: 44 mg/dL — AB (ref 6–20)
CHLORIDE: 97 mmol/L — AB (ref 98–111)
CO2: 28 mmol/L (ref 22–32)
CREATININE: 1.02 mg/dL — AB (ref 0.44–1.00)
Calcium: 10.2 mg/dL (ref 8.9–10.3)
GFR calc Af Amer: >60 mL/min (ref >60)
GFR calc non Af Amer: 60 mL/min — ABNORMAL LOW (ref >60)
GLUCOSE: 179 mg/dL — AB (ref 70–99)
POTASSIUM: 4.3 mmol/L (ref 3.5–5.1)
SODIUM: 137 mmol/L (ref 135–145)

## 2017-08-08 LAB — ABO/RH: ABO/RH(D): O NEG

## 2017-08-08 LAB — PREPARE RBC (CROSSMATCH)

## 2017-08-08 LAB — PHOSPHORUS: Phosphorus: 4 mg/dL (ref 2.5–4.6)

## 2017-08-08 NOTE — Progress Notes (Signed)
Pulmonary Critical Care Medicine [facility]   PULMONARY SERVICE  PROGRESS NOTE  Date of Service: 08/08/2017  Martha Carter  EAV:[id number]  DOB: November 07, 1960   DOA: 07/25/2017  Referring Physician: Carron CurieAli Hijazi, MD  HPI: Martha BustleGlenda W Carter is a 57 y.o. female seen for follow up of Acute on Chronic Respiratory Failure.  Patient is on T collar is doing fairly well.  Has tolerated capping yesterday.  Patient did do about 4 hours of capping  Medications: Reviewed on Rounds  Physical Exam:  Vitals: Temperature 96.7 pulse 87 respiratory rate 20 blood pressure is 101/45 saturations 100%  Ventilator Settings on aerosolized T collar FiO2 28%  . General: Comfortable at this time . Eyes: Grossly normal lids, irises & conjunctiva . ENT: grossly tongue is normal . Neck: no obvious mass . Cardiovascular: S1 S2 normal no gallop . Respiratory: No rhonchi or rales are noted . Abdomen: soft . Skin: no rash seen on limited exam . Musculoskeletal: not rigid . Psychiatric:unable to assess . Neurologic: no seizure no involuntary movements         Lab Data:   Basic Metabolic Panel: Recent Labs  Lab 08/05/17 0615 08/08/17 0651  NA 139 137  K 4.3 4.3  CL 101 97*  CO2 28 28  GLUCOSE 141* 179*  BUN 46* 44*  CREATININE 0.94 1.02*  CALCIUM 10.0 10.2  MG 2.5* 2.3  PHOS  --  4.0    Liver Function Tests: No results for input(s): AST, ALT, ALKPHOS, BILITOT, PROT, ALBUMIN in the last 168 hours. No results for input(s): LIPASE, AMYLASE in the last 168 hours. No results for input(s): AMMONIA in the last 168 hours.  CBC: Recent Labs  Lab 08/05/17 0615 08/08/17 0651  WBC 9.3 10.2  HGB 7.5* 6.8*  HCT 24.3* 21.3*  MCV 90.7 88.4  PLT 249 222    Cardiac Enzymes: No results for input(s): CKTOTAL, CKMB, CKMBINDEX, TROPONINI in the last 168 hours.  BNP (last 3 results) No results for input(s): BNP in the last 8760 hours.  ProBNP (last 3 results) No results for  input(s): PROBNP in the last 8760 hours.  Radiological Exams: No results found.  Assessment/Plan Principal Problem:   Acute on chronic respiratory failure with hypoxia (HCC) Active Problems:   Lobar pneumonia, unspecified organism [facility](HCC)   Coronary artery disease   Chronic atrial fibrillation (HCC)   1. Acute on chronic respiratory failure with hypoxia we will continue with T collar trials continue secretion management pulmonary toilet. 2. Lobar pneumonia clinically improved continue present management. 3. Coronary artery disease at baseline 4. Chronic atrial fibrillation rate is controlled   I have personally seen and evaluated the patient, evaluated laboratory and imaging results, formulated the assessment and plan and placed orders. The Patient requires high complexity decision making for assessment and support.  Case was discussed on Rounds with the Respiratory Therapy Staff  Yevonne PaxSaadat A Kharter Sestak, MD [facility] Pulmonary Critical Care Medicine Sleep Medicine

## 2017-08-09 ENCOUNTER — Other Ambulatory Visit: Payer: Self-pay

## 2017-08-09 ENCOUNTER — Emergency Department (HOSPITAL_COMMUNITY)
Admission: EM | Admit: 2017-08-09 | Discharge: 2017-08-10 | Disposition: A | Discharge status: Home / Self Care | Payer: No Typology Code available for payment source | Attending: Emergency Medicine | Admitting: Emergency Medicine

## 2017-08-09 ENCOUNTER — Emergency Department (HOSPITAL_COMMUNITY): Payer: No Typology Code available for payment source

## 2017-08-09 ENCOUNTER — Encounter (HOSPITAL_COMMUNITY): Payer: Self-pay

## 2017-08-09 DIAGNOSIS — I259 Chronic ischemic heart disease, unspecified: Secondary | ICD-10-CM | POA: Diagnosis not present

## 2017-08-09 DIAGNOSIS — Z794 Long term (current) use of insulin: Secondary | ICD-10-CM | POA: Diagnosis not present

## 2017-08-09 DIAGNOSIS — I2510 Atherosclerotic heart disease of native coronary artery without angina pectoris: Secondary | ICD-10-CM | POA: Diagnosis not present

## 2017-08-09 DIAGNOSIS — L89612 Pressure ulcer of right heel, stage 2: Secondary | ICD-10-CM | POA: Insufficient documentation

## 2017-08-09 DIAGNOSIS — Z91040 Latex allergy status: Secondary | ICD-10-CM | POA: Diagnosis not present

## 2017-08-09 DIAGNOSIS — J9621 Acute and chronic respiratory failure with hypoxia: Secondary | ICD-10-CM | POA: Diagnosis not present

## 2017-08-09 DIAGNOSIS — Z87891 Personal history of nicotine dependence: Secondary | ICD-10-CM | POA: Insufficient documentation

## 2017-08-09 DIAGNOSIS — Z79899 Other long term (current) drug therapy: Secondary | ICD-10-CM | POA: Diagnosis not present

## 2017-08-09 DIAGNOSIS — R45851 Suicidal ideations: Secondary | ICD-10-CM | POA: Insufficient documentation

## 2017-08-09 DIAGNOSIS — L89152 Pressure ulcer of sacral region, stage 2: Secondary | ICD-10-CM | POA: Diagnosis not present

## 2017-08-09 DIAGNOSIS — Z930 Tracheostomy status: Secondary | ICD-10-CM | POA: Diagnosis not present

## 2017-08-09 DIAGNOSIS — I482 Chronic atrial fibrillation: Secondary | ICD-10-CM | POA: Diagnosis not present

## 2017-08-09 DIAGNOSIS — E1151 Type 2 diabetes mellitus with diabetic peripheral angiopathy without gangrene: Secondary | ICD-10-CM | POA: Insufficient documentation

## 2017-08-09 DIAGNOSIS — F320 Major depressive disorder, single episode, mild: Secondary | ICD-10-CM | POA: Diagnosis not present

## 2017-08-09 DIAGNOSIS — Z046 Encounter for general psychiatric examination, requested by authority: Secondary | ICD-10-CM | POA: Diagnosis present

## 2017-08-09 DIAGNOSIS — Z7982 Long term (current) use of aspirin: Secondary | ICD-10-CM | POA: Diagnosis not present

## 2017-08-09 DIAGNOSIS — I4891 Unspecified atrial fibrillation: Secondary | ICD-10-CM | POA: Diagnosis not present

## 2017-08-09 DIAGNOSIS — J181 Lobar pneumonia, unspecified organism: Secondary | ICD-10-CM | POA: Diagnosis not present

## 2017-08-09 LAB — RENAL FUNCTION PANEL
Albumin: 2.5 g/dL — ABNORMAL LOW (ref 3.5–5.0)
Anion gap: 10 (ref 5–15)
BUN: 49 mg/dL — ABNORMAL HIGH (ref 6–20)
CALCIUM: 9.9 mg/dL (ref 8.9–10.3)
CO2: 27 mmol/L (ref 22–32)
Chloride: 99 mmol/L (ref 98–111)
Creatinine, Ser: 0.99 mg/dL (ref 0.44–1.00)
Glucose, Bld: 110 mg/dL — ABNORMAL HIGH (ref 70–99)
PHOSPHORUS: 3.7 mg/dL (ref 2.5–4.6)
Potassium: 3.8 mmol/L (ref 3.5–5.1)
SODIUM: 136 mmol/L (ref 135–145)

## 2017-08-09 LAB — CBC WITH DIFFERENTIAL/PLATELET
ABS IMMATURE GRANULOCYTES: 0.1 10*3/uL (ref 0.0–0.1)
Basophils Absolute: 0 10*3/uL (ref 0.0–0.1)
Basophils Relative: 0 %
Eosinophils Absolute: 0.2 10*3/uL (ref 0.0–0.7)
Eosinophils Relative: 3 %
HEMATOCRIT: 25.2 % — AB (ref 36.0–46.0)
Hemoglobin: 8.1 g/dL — ABNORMAL LOW (ref 12.0–15.0)
IMMATURE GRANULOCYTES: 1 %
Lymphocytes Relative: 12 %
Lymphs Abs: 1 10*3/uL (ref 0.7–4.0)
MCH: 28.8 pg (ref 26.0–34.0)
MCHC: 32.1 g/dL (ref 30.0–36.0)
MCV: 89.7 fL (ref 78.0–100.0)
MONOS PCT: 6 %
Monocytes Absolute: 0.5 10*3/uL (ref 0.1–1.0)
NEUTROS ABS: 6.7 10*3/uL (ref 1.7–7.7)
NEUTROS PCT: 78 %
Platelets: 227 10*3/uL (ref 150–400)
RBC: 2.81 MIL/uL — ABNORMAL LOW (ref 3.87–5.11)
RDW: 17.7 % — ABNORMAL HIGH (ref 11.5–15.5)
WBC: 8.6 10*3/uL (ref 4.0–10.5)

## 2017-08-09 LAB — CBC
HCT: 23.8 % — ABNORMAL LOW (ref 36.0–46.0)
HEMOGLOBIN: 7.7 g/dL — AB (ref 12.0–15.0)
MCH: 28.4 pg (ref 26.0–34.0)
MCHC: 32.4 g/dL (ref 30.0–36.0)
MCV: 87.8 fL (ref 78.0–100.0)
PLATELETS: 209 10*3/uL (ref 150–400)
RBC: 2.71 MIL/uL — AB (ref 3.87–5.11)
RDW: 17.4 % — ABNORMAL HIGH (ref 11.5–15.5)
WBC: 7.9 10*3/uL (ref 4.0–10.5)

## 2017-08-09 LAB — OCCULT BLOOD X 1 CARD TO LAB, STOOL: FECAL OCCULT BLD: NEGATIVE

## 2017-08-09 LAB — TYPE AND SCREEN
ABO/RH(D): O NEG
ANTIBODY SCREEN: NEGATIVE
UNIT DIVISION: 0

## 2017-08-09 LAB — COMPREHENSIVE METABOLIC PANEL
ALT: 29 U/L (ref 0–44)
AST: 22 U/L (ref 15–41)
Albumin: 2.7 g/dL — ABNORMAL LOW (ref 3.5–5.0)
Alkaline Phosphatase: 95 U/L (ref 38–126)
Anion gap: 11 (ref 5–15)
BILIRUBIN TOTAL: 1.3 mg/dL — AB (ref 0.3–1.2)
BUN: 47 mg/dL — AB (ref 6–20)
CO2: 26 mmol/L (ref 22–32)
Calcium: 10.2 mg/dL (ref 8.9–10.3)
Chloride: 98 mmol/L (ref 98–111)
Creatinine, Ser: 0.92 mg/dL (ref 0.44–1.00)
GFR calc Af Amer: >60 mL/min (ref >60)
Glucose, Bld: 183 mg/dL — ABNORMAL HIGH (ref 70–99)
Potassium: 3.9 mmol/L (ref 3.5–5.1)
Sodium: 135 mmol/L (ref 135–145)
TOTAL PROTEIN: 6.8 g/dL (ref 6.5–8.1)

## 2017-08-09 LAB — MAGNESIUM: MAGNESIUM: 2.3 mg/dL (ref 1.7–2.4)

## 2017-08-09 LAB — BPAM RBC
BLOOD PRODUCT EXPIRATION DATE: 201908202359
ISSUE DATE / TIME: 201908061110
UNIT TYPE AND RH: 9500

## 2017-08-09 LAB — ETHANOL

## 2017-08-09 MED ORDER — GUAIFENESIN 100 MG/5ML PO SOLN
10.0000 mL | Freq: Four times a day (QID) | ORAL | Status: DC | PRN
  Filled 2017-08-09: qty 10

## 2017-08-09 MED ORDER — ASPIRIN 81 MG PO CHEW: 81.0000 mg | CHEWABLE_TABLET | Freq: Every day | ORAL | Status: DC

## 2017-08-09 MED ORDER — INSULIN LISPRO 100 UNIT/ML ~~LOC~~ SOLN: 4.0000 [IU] | Freq: Four times a day (QID) | SUBCUTANEOUS | Status: DC

## 2017-08-09 MED ORDER — QUETIAPINE FUMARATE 200 MG PO TABS
200.0000 mg | ORAL_TABLET | Freq: Two times a day (BID) | ORAL | Status: DC
  Administered 2017-08-10: 200 mg
  Filled 2017-08-09 (×2): qty 1

## 2017-08-09 MED ORDER — INSULIN GLARGINE 100 UNIT/ML ~~LOC~~ SOLN
30.0000 [IU] | Freq: Every day | SUBCUTANEOUS | Status: DC
  Filled 2017-08-09: qty 0.3

## 2017-08-09 MED ORDER — ATORVASTATIN CALCIUM 10 MG PO TABS
10.0000 mg | ORAL_TABLET | Freq: Every day | ORAL | Status: DC
  Administered 2017-08-10: 10 mg
  Filled 2017-08-09: qty 1

## 2017-08-09 MED ORDER — METOPROLOL TARTRATE 5 MG/5ML IV SOLN: 5.0000 mg | Freq: Four times a day (QID) | INTRAVENOUS | Status: DC | PRN

## 2017-08-09 MED ORDER — CARVEDILOL 12.5 MG PO TABS
12.5000 mg | ORAL_TABLET | Freq: Two times a day (BID) | ORAL | Status: DC
  Administered 2017-08-10: 12.5 mg
  Filled 2017-08-09: qty 1

## 2017-08-09 MED ORDER — ONDANSETRON HCL 4 MG/2ML IJ SOLN: 4.0000 mg | Freq: Four times a day (QID) | INTRAMUSCULAR | Status: DC | PRN

## 2017-08-09 MED ORDER — ONDANSETRON HCL 4 MG PO TABS: 4.0000 mg | ORAL_TABLET | Freq: Four times a day (QID) | ORAL | Status: DC | PRN

## 2017-08-09 MED ORDER — AMLODIPINE BESYLATE 5 MG PO TABS: 5.0000 mg | ORAL_TABLET | Freq: Every day | ORAL | Status: DC

## 2017-08-09 MED ORDER — HALOPERIDOL 1 MG PO TABS: 1.0000 mg | ORAL_TABLET | Freq: Four times a day (QID) | ORAL | Status: DC | PRN

## 2017-08-09 MED ORDER — CITALOPRAM HYDROBROMIDE 10 MG PO TABS: 20.0000 mg | ORAL_TABLET | Freq: Every day | ORAL | Status: DC

## 2017-08-09 MED ORDER — ACETAMINOPHEN 325 MG PO TABS: 650.0000 mg | ORAL_TABLET | Freq: Four times a day (QID) | ORAL | Status: DC | PRN

## 2017-08-09 MED ORDER — GABAPENTIN 300 MG PO CAPS
300.0000 mg | ORAL_CAPSULE | Freq: Every day | ORAL | Status: DC
  Administered 2017-08-10: 300 mg
  Filled 2017-08-09: qty 1

## 2017-08-09 MED ORDER — CLONAZEPAM 0.5 MG PO TABS
0.5000 mg | ORAL_TABLET | Freq: Three times a day (TID) | ORAL | Status: DC
  Administered 2017-08-10: 0.5 mg
  Filled 2017-08-09: qty 1

## 2017-08-09 MED ORDER — PANTOPRAZOLE SODIUM 40 MG PO PACK
40.0000 mg | PACK | Freq: Two times a day (BID) | ORAL | Status: DC
  Filled 2017-08-09: qty 20

## 2017-08-09 NOTE — ED Notes (Signed)
Suicide precautions in place, belongings inventoried by Janeece RiggersAndrea RN and placed in locker 13, all wires removed from room, pts door remaining open to monitor pt condition.

## 2017-08-09 NOTE — ED Triage Notes (Signed)
Trach patient here from [facility] with c/o wanting to kill herself by "throwing herself out of the window" Nurse from [facility] at bedside until sitter gets here.

## 2017-08-09 NOTE — Progress Notes (Signed)
[facility]ulmonary Critical [facility]   PULMONARY SERVICE  PROGRESS NOTE  Date of Service: 08/09/2017  Martha Carter  [id number]  DOB: 26-Mar-1960   DOA: 07/25/2017  Referring Physician: Carron CurieAli Hijazi, MD  HPI: Martha Carter Martha Carter is a 57 y.o. female seen for follow up of Acute on Chronic Respiratory Failure.  She is capping did about 4 hours has been on room air no distress  Medications: Reviewed on Rounds  Physical Exam:  Vitals: Temperature 98.6 pulse 89 respiratory rate 18 blood pressure 146/58 saturations 100%  Ventilator Settings capping doing well  . General: Comfortable at this time . Eyes: Grossly normal lids, irises & conjunctiva . ENT: grossly tongue is normal . Neck: no obvious mass . Cardiovascular: S1 S2 normal no gallop . Respiratory: No rhonchi no rales are noted . Abdomen: soft . Skin: no rash seen on limited exam . Musculoskeletal: not rigid . Psychiatric:unable to assess . Neurologic: no seizure no involuntary movements         Lab Data:   Basic Metabolic Panel: Recent Labs  Lab 08/05/17 0615 08/08/17 0651 08/09/17 0527  NA 139 137 136  K 4.3 4.3 3.8  CL 101 97* 99  CO2 28 28 27   GLUCOSE 141* 179* 110*  BUN 46* 44* 49*  CREATININE 0.94 1.02* 0.99  CALCIUM 10.0 10.2 9.9  MG 2.5* 2.3 2.3  PHOS  --  4.0 3.7    Liver Function Tests: Recent Labs  Lab 08/09/17 0527  ALBUMIN 2.5*   No results for input(s): LIPASE, AMYLASE in the last 168 hours. No results for input(s): AMMONIA in the last 168 hours.  CBC: Recent Labs  Lab 08/05/17 0615 08/08/17 0651 08/09/17 0527  WBC 9.3 10.2 7.9  HGB 7.5* 6.8* 7.7*  HCT 24.3* 21.3* 23.8*  MCV 90.7 88.4 87.8  PLT 249 222 209    Cardiac Enzymes: No results for input(s): CKTOTAL, CKMB, CKMBINDEX, TROPONINI in the last 168 hours.  BNP (last 3 results) No results for input(s): BNP in the last 8760 hours.  ProBNP (last 3 results) No results for input(s): PROBNP in  the last 8760 hours.  Radiological Exams: No results found.  Assessment/Plan Principal Problem:   Acute on chronic respiratory failure with hypoxia (HCC) Active Problems:   Lobar pneumonia, unspecified organism [facility](HCC)   Coronary artery disease   Chronic atrial fibrillation (HCC)   1. Acute on chronic respiratory failure with hypoxia we will continue with capping trials continue pulmonary toilet secretion management overall prognosis guarded. 2. Lobar pneumonia treated with antibiotics we will continue to follow 3. Chronic atrial fibrillation rate is controlled 4. Coronary artery disease stable we will continue present management   I have personally seen and evaluated the patient, evaluated laboratory and imaging results, formulated the assessment and plan and placed orders. The Patient requires high complexity decision making for assessment and support.  Case was discussed on Rounds with the Respiratory Therapy Staff  Yevonne PaxSaadat A Yeriel Mineo, MD [facility] Pulmonary Critical Care Medicine Sleep Medicine

## 2017-08-09 NOTE — ED Provider Notes (Signed)
[facility]ENC[facility] Provider Note   CSN: [id number] Arrival date & time: 08/09/17  2037     History   Chief Complaint Chief Complaint  Patient presents with  . Psychiatric Evaluation    HPI Martha Carter is a 57 y.o. female history of diabetes, atrial fibrillation, pneumonia with trach here presenting with some suicidal ideations.  Patient apparently told the nurse that she wanted to jump out the window.  However she has a trach and is on trach collar and had knee fractures and sacral decub ulcers and unable to walk at baseline. She told me that she is no longer suicidal but she wants to go to a nursing home when she is stable to get out of [facility]. She is sent here for psych eval. Of note, critical care saw patient today regarding weaning trial and patient still needs to be on oxygen with trach collar.   The history is provided by the patient.    Past Medical History:  Diagnosis Date  . Acute on chronic respiratory failure with hypoxia (HCC)   . Chronic anemia   . Chronic atrial fibrillation (HCC)   . Chronic pain syndrome   . Coronary artery disease   . 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 GI bleed   . Lobar pneumonia, unspecified organism (HCC)   . Peripheral arterial disease [facility](HCC)     Patient Active Problem List   Diagnosis Date Noted  . Acute on chronic respiratory failure with hypoxia (HCC)   . Lobar pneumonia, unspecified organism (HCC)   . Coronary artery disease   . Chronic atrial fibrillation [facility](HCC)     Past Surgical History:  Procedure Laterality Date  . ANGIOPLASTY    . CHOLECYSTECTOMY    . COLONOSCOPY    . CORONARY ARTERY BYPASS GRAFT    . HYSTERECTOMY ABDOMINAL WITH SALPINGECTOMY    . IR GASTROSTOMY TUBE MOD SED  07/28/2017  . KNEE SURGERY    . TONSILLECTOMY    . TRACHEOSTOMY TUBE PLACEMENT N/A 07/25/2017   Procedure: TRACHEOSTOMY;  Surgeon: Drema HalonNewman, Christopher E, MD;   [facility];  Service: ENT;  Laterality: N/A;     OB History   None      Home Medications    Prior to Admission medications   Medication Sig Start Date End Date Taking? Authorizing Provider  acetaminophen (TYLENOL) 325 MG tablet Place 650 mg into feeding tube every 6 (six) hours as needed.   Yes [provider]  acetaminophen (TYLENOL) 650 MG suppository Place 650 mg rectally every 6 (six) hours as needed for mild pain, moderate pain or fever.   Yes [provider]  Amino Acids-Protein Hydrolys (FEEDING SUPPLEMENT, PRO-STAT SUGAR FREE 64,) LIQD Take 30 mLs by mouth 2 (two) times daily.   Yes [provider]  amLODipine (NORVASC) 5 MG tablet Place 5 mg into feeding tube daily.   Yes [provider]  antiseptic oral rinse (BIOTENE) LIQD 15 mLs by Mouth Rinse route 3 (three) times daily.   Yes [provider]  aspirin 81 MG chewable tablet Place 81 mg into feeding tube daily.   Yes [provider]  atorvastatin (LIPITOR) 10 MG tablet Place 10 mg into feeding tube at bedtime.   Yes [provider]  carvedilol (COREG) 12.5 MG tablet Place 12.5 mg into feeding tube 2 (two) times daily.   Yes [provider]  citalopram (CELEXA) 10 MG tablet Place  20 mg into feeding tube daily.   Yes [provider]  clonazePAM (KLONOPIN) 0.5 MG tablet Place 0.5 mg into feeding tube 3 (three) times daily.   Yes [provider]  Dextrose-Sodium Chloride (DEXTROSE 5 % AND 0.45% NACL) infusion Inject 1,000 mLs into the vein as needed.   Yes [provider]  gabapentin (NEURONTIN) 300 MG capsule Place 300 mg into feeding tube at bedtime.   Yes [provider]  guaiFENesin (ROBITUSSIN) 100 MG/5ML SOLN Place 10 mLs into feeding tube every 6 (six) hours as needed for cough or to loosen phlegm.   Yes [provider]  haloperidol (HALDOL) 1 MG tablet Place 1 mg into feeding tube every 6 (six) hours as  needed (anxiety).   Yes [provider]  insulin glargine (LANTUS) 100 UNIT/ML injection Inject 30 Units into the skin daily. Hold if tube feed stopped, NPO, or if BS is less than 70   Yes [provider]  insulin lispro (HUMALOG) 100 UNIT/ML injection Inject 4-16 Units into the skin every 6 (six) hours. Per sliding scale  70-150 give 0 units 151-200 give 4 units 201-250 give 8 units  251-300 give 10 units 301-350 give 12 units 351-400 give 16 units >400 give 16 units, call MD and obtain stat lab verification.   Yes [provider]  magnesium oxide (MAG-OX) 400 MG tablet Place 400 mg into feeding tube every 6 (six) hours as needed.   Yes [provider]  magnesium sulfate 1-5 GM/100ML-% INFUSION Inject 1 g into the vein as needed.   Yes [provider]  metoprolol tartrate (LOPRESSOR) 5 MG/5ML SOLN injection Inject 5 mg into the vein every 6 (six) hours as needed (HR >115).   Yes [provider]  Multiple Vitamin (MULTIVITAMIN WITH MINERALS) TABS tablet Place 1 tablet into feeding tube daily.   Yes [provider]  nitroGLYCERIN (NITROGLYN) 2 % ointment Apply 0.5 inches topically every 6 (six) hours. Off from 12 midnight to 6am for nitrate break   Yes [provider]  ondansetron (ZOFRAN) 4 MG tablet Place 4 mg into feeding tube every 6 (six) hours as needed for nausea or vomiting.   Yes [provider]  ondansetron (ZOFRAN) 4 MG/2ML SOLN injection Inject 4 mg into the vein every 6 (six) hours as needed for nausea or vomiting.   Yes [provider]  oxybutynin (DITROPAN) 5 MG tablet Place 5 mg into feeding tube 2 (two) times daily. 08/07/17 08/17/17 Yes [provider]  pantoprazole sodium (PROTONIX) 40 mg/20 mL PACK Place 40 mg into feeding tube 2 (two) times daily.   Yes [provider]  potassium chloride 10 MEQ/50ML Inject 10 mEq into the vein as needed.   Yes [provider]    potassium chloride SA (K-DUR,KLOR-CON) 20 MEQ tablet Take 20 mEq by mouth every 4 (four) hours as needed.   Yes [provider]  QUEtiapine (SEROQUEL) 200 MG tablet Place 200 mg into feeding tube 2 (two) times daily.   Yes [provider]  sodium polystyrene (KAYEXALATE) 15 GM/60ML suspension Take 30 g by mouth as needed.   Yes [provider]    Family History Family History  Family history unknown: Yes    Social History Social History   Tobacco Use  . Smoking status: Former Games developer  . Smokeless tobacco: Never Used  Substance Use Topics  . Alcohol use: Not Currently  . Drug use: Not Currently     Allergies  Exenatide; Sulfamethoxazole; Ace inhibitors; Ceftriaxone; Clindamycin; Doxycycline; Hydromorphone; Latex; Metoclopramide; Morphine; and Tape   Review of Systems Review of Systems  Psychiatric/Behavioral: Positive for suicidal ideas.  All other systems reviewed and are negative.    Physical Exam Updated Vital Signs BP 100/63   Pulse 100   Resp 20   LMP  (LMP Unknown)   SpO2 100%   Physical Exam  Constitutional: She is oriented to person, place, and time.  Chronically ill   HENT:  Head: Normocephalic.  Eyes: Pupils are equal, round, and reactive to light. EOM are normal.  Neck:  Trach collar on oxygen   Cardiovascular: Normal rate, regular rhythm and normal heart sounds.  Pulmonary/Chest: Effort normal.  Diminished bilateral bases   Abdominal: Soft. Bowel sounds are normal.  Musculoskeletal:  Stage 2 sacral decub, stage 2 R heel decub   Neurological: She is alert and oriented to person, place, and time.  Skin: Skin is warm.  Psychiatric: She has a normal mood and affect.  Nursing note and vitals reviewed.    ED Treatments / Results  Labs (all labs ordered are listed, but only abnormal results are displayed) Labs Reviewed  CBC WITH DIFFERENTIAL/PLATELET - Abnormal; Notable for the following components:      Result Value    RBC 2.81 (*)    Hemoglobin 8.1 (*)    HCT 25.2 (*)    RDW 17.7 (*)    All other components within normal limits  COMPREHENSIVE METABOLIC PANEL - Abnormal; Notable for the following components:   Glucose, Bld 183 (*)    BUN 47 (*)    Albumin 2.7 (*)    Total Bilirubin 1.3 (*)    All other components within normal limits  ETHANOL    EKG None  Radiology Dg Chest Port 1 View  Result Date: 08/09/2017 CLINICAL DATA:  Dyspnea. EXAM: PORTABLE CHEST 1 VIEW COMPARISON:  07/17/2017. FINDINGS: Tracheostomy tube in satisfactory position. Stable mildly enlarged cardiac silhouette. Patchy opacity at the left lung base with significant improvement. Small amount of residual loculated pleural fluid laterally on the left. Clear right lung the exception of minimal interval linear density in the mid lung zone. Stable median sternotomy wires. Unremarkable bones. IMPRESSION: 1. Improving left basilar probable pneumonia and pleural fluid. 2. Interval minimal linear atelectasis in the right mid lung zone. Electronically Signed   By: Beckie Salts M.D.   On: 08/09/2017 21:22    Procedures Procedures (including critical care time)  Medications Ordered in ED Medications  acetaminophen (TYLENOL) tablet 650 mg (has no administration in time range)  amLODipine (NORVASC) tablet 5 mg (has no administration in time range)  aspirin chewable tablet 81 mg (has no administration in time range)  atorvastatin (LIPITOR) tablet 10 mg (has no administration in time range)  carvedilol (COREG) tablet 12.5 mg (has no administration in time range)  citalopram (CELEXA) tablet 20 mg (has no administration in time range)  clonazePAM (KLONOPIN) tablet 0.5 mg (has no administration in time range)  gabapentin (NEURONTIN) capsule 300 mg (has no administration in time range)  guaiFENesin (ROBITUSSIN) 100 MG/5ML solution 200 mg (has no administration in time range)  haloperidol (HALDOL) tablet 1 mg (has no administration in time  range)  insulin glargine (LANTUS) injection 30 Units (has no administration in time range)  insulin lispro (HUMALOG) injection 4-16 Units (has no administration in time range)  metoprolol tartrate (LOPRESSOR) injection 5 mg (has no administration in time range)  ondansetron (ZOFRAN) tablet 4 mg (has no administration  in time range)  ondansetron (ZOFRAN) injection 4 mg (has no administration in time range)  pantoprazole sodium (PROTONIX) 40 mg/20 mL oral suspension 40 mg (has no administration in time range)  QUEtiapine (SEROQUEL) tablet 200 mg (has no administration in time range)     Initial Impression / Assessment and Plan / ED Course  I have reviewed the triage vital signs and the nursing notes.  Pertinent labs & imaging results that were available during my care of the patient were reviewed by me and considered in my medical decision making (see chart for details).     Martha Carter is a 57 y.o. female here with suicidal ideation. [facility]. Patient is on oxygen through trach collar at baseline. Will get psych clearance labs, CXR. She has R arm IV and sacral decubs present on admission.   11:53 PM Labs and CXR stable. Medically cleared. Ordered home meds.    Final Clinical Impressions(s) / ED Diagnoses   Final diagnoses:  None    ED Discharge Orders    None       Charlynne Pander, MD 08/09/17 2353

## 2017-08-10 ENCOUNTER — Inpatient Hospital Stay
Admission: RE | Admit: 2017-08-10 | Discharge: 2017-08-21 | Disposition: A | Discharge status: Skilled Nursing Facility | Payer: Medicare Other | Source: Skilled Nursing Facility | Attending: Internal Medicine | Admitting: Internal Medicine

## 2017-08-10 DIAGNOSIS — J9621 Acute and chronic respiratory failure with hypoxia: Secondary | ICD-10-CM | POA: Diagnosis present

## 2017-08-10 DIAGNOSIS — J181 Lobar pneumonia, unspecified organism: Secondary | ICD-10-CM | POA: Diagnosis present

## 2017-08-10 DIAGNOSIS — I482 Chronic atrial fibrillation, unspecified: Secondary | ICD-10-CM | POA: Diagnosis present

## 2017-08-10 DIAGNOSIS — I2510 Atherosclerotic heart disease of native coronary artery without angina pectoris: Secondary | ICD-10-CM | POA: Diagnosis not present

## 2017-08-10 DIAGNOSIS — I480 Paroxysmal atrial fibrillation: Secondary | ICD-10-CM | POA: Diagnosis present

## 2017-08-10 DIAGNOSIS — I2583 Coronary atherosclerosis due to lipid rich plaque: Secondary | ICD-10-CM

## 2017-08-10 LAB — CBG MONITORING, ED: GLUCOSE-CAPILLARY: 203 mg/dL — AB (ref 70–99)

## 2017-08-10 MED ORDER — INSULIN ASPART 100 UNIT/ML ~~LOC~~ SOLN: 0.0000 [IU] | Freq: Four times a day (QID) | SUBCUTANEOUS | Status: DC

## 2017-08-10 NOTE — Progress Notes (Signed)
Pulmonary Critical Care Medicine [facility] [place]   PULMONARY SERVICE  PROGRESS NOTE  Date of Service: 08/10/2017  Martha Carter  QIH:[id number]  DOB: 04/22/1960   DOA: 08/10/2017  Referring Physician: Carron Curie, MD  HPI: Martha Carter is a 57 y.o. female seen for follow up of Acute on Chronic Respiratory Failure.  Patient was sent down to the emergency department because she had voiced that she was going to jump out the window.  She was seen by psychiatry not felt to be a danger to herself at this time.  Right now she is capping doing fairly well.  She is still periodically agitated and confused.  Medications: Reviewed on Rounds  Physical Exam:  Vitals: Temperature 97.2 pulse 90 respiratory rate 18 blood pressure 130/65 saturations 100%  Ventilator Settings currently is capping doing fairly well  . General: Comfortable at this time . Eyes: Grossly normal lids, irises & conjunctiva . ENT: grossly tongue is normal . Neck: no obvious mass . Cardiovascular: S1 S2 normal no gallop . Respiratory: No rhonchi are noted at this time. . Abdomen: soft . Skin: no rash seen on limited exam . Musculoskeletal: not rigid . Psychiatric:unable to assess . Neurologic: no seizure no involuntary movements         Lab Data:   Basic Metabolic Panel: Recent Labs  Lab 08/05/17 0615 08/08/17 0651 08/09/17 0527 08/09/17 2137  NA 139 137 136 135  K 4.3 4.3 3.8 3.9  CL 101 97* 99 98  CO2 28 28 27 26   GLUCOSE 141* 179* 110* 183*  BUN 46* 44* 49* 47*  CREATININE 0.94 1.02* 0.99 0.92  CALCIUM 10.0 10.2 9.9 10.2  MG 2.5* 2.3 2.3  --   PHOS  --  4.0 3.7  --     Liver Function Tests: Recent Labs  Lab 08/09/17 0527 08/09/17 2137  AST  --  22  ALT  --  29  ALKPHOS  --  95  BILITOT  --  1.3*  PROT  --  6.8  ALBUMIN 2.5* 2.7*   No results for input(s): LIPASE, AMYLASE in the last 168 hours. No results for input(s): AMMONIA in the last 168 hours.  CBC: Recent  Labs  Lab 08/05/17 0615 08/08/17 0651 08/09/17 0527 08/09/17 2137  WBC 9.3 10.2 7.9 8.6  NEUTROABS  --   --   --  6.7  HGB 7.5* 6.8* 7.7* 8.1*  HCT 24.3* 21.3* 23.8* 25.2*  MCV 90.7 88.4 87.8 89.7  PLT 249 222 209 227    Cardiac Enzymes: No results for input(s): CKTOTAL, CKMB, CKMBINDEX, TROPONINI in the last 168 hours.  BNP (last 3 results) No results for input(s): BNP in the last 8760 hours.  ProBNP (last 3 results) No results for input(s): PROBNP in the last 8760 hours.  Radiological Exams: Dg Chest Port 1 View  Result Date: 08/09/2017 CLINICAL DATA:  Dyspnea. EXAM: PORTABLE CHEST 1 VIEW COMPARISON:  07/17/2017. FINDINGS: Tracheostomy tube in satisfactory position. Stable mildly enlarged cardiac silhouette. Patchy opacity at the left lung base with significant improvement. Small amount of residual loculated pleural fluid laterally on the left. Clear right lung the exception of minimal interval linear density in the mid lung zone. Stable median sternotomy wires. Unremarkable bones. IMPRESSION: 1. Improving left basilar probable pneumonia and pleural fluid. 2. Interval minimal linear atelectasis in the right mid lung zone. Electronically Signed   By: Beckie Salts M.D.   On: 08/09/2017 21:22    Assessment/Plan Principal  Problem:   Acute on chronic respiratory failure with hypoxia (HCC) Active Problems:   Lobar pneumonia, unspecified organism [facility](HCC)   Coronary artery disease   Chronic atrial fibrillation (HCC)   1. Acute on chronic respiratory failure with hypoxia patient is going to continue with the capping trials.  She is been tolerating it well.  Continue with pulmonary toilet secretions have been minimal. 2. Lobar pneumonia treated with antibiotics improved 3. Coronary artery disease at baseline 4. Chronic atrial fibrillation rate is controlled we will follow   I have personally seen and evaluated the patient, evaluated laboratory and imaging results, formulated the  assessment and plan and placed orders. The Patient requires high complexity decision making for assessment and support.  Case was discussed on Rounds with the Respiratory Therapy Staff  Yevonne PaxSaadat A Camden Mazzaferro, MD [facility] Pulmonary Critical Care Medicine Sleep Medicine

## 2017-08-10 NOTE — ED Provider Notes (Signed)
Patient seen and evaluated by Dr. Silverio LayYao, presented from skilled nursing facility with complaints of suicidal ideation, but plan that she would not of been able to implement (wanted to jump out of a window, but she is nonambulatory and cannot get to the window from her bed).  TTS consultation is appreciated.  On their evaluation, she is not suicidal and felt to be safe for discharge.  Patient is advised of this.  She is returned to her skilled nursing facility with request that her provider there make arrangements for psychiatric evaluation and treatment.   Dione BoozeGlick, Halea Lieb, MD 08/10/17 [id number]

## 2017-08-10 NOTE — Progress Notes (Signed)
Pt was transferred to pod F without any events to report.  PMV speaking valve removed for night time rest

## 2017-08-10 NOTE — ED Notes (Signed)
Talked to Dr Preston FleetingGlick, Pt states she takes medications orally now. Medications given orally without any difficulty

## 2017-08-10 NOTE — ED Notes (Signed)
[facility]; instructions provided; Pt encouraged to return to ED if symptoms worsen and to f/u with PCP; Pt verbalized understanding of all instructions
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
## 2017-08-11 DIAGNOSIS — J181 Lobar pneumonia, unspecified organism: Secondary | ICD-10-CM | POA: Diagnosis not present

## 2017-08-11 DIAGNOSIS — I2510 Atherosclerotic heart disease of native coronary artery without angina pectoris: Secondary | ICD-10-CM | POA: Diagnosis not present

## 2017-08-11 DIAGNOSIS — I482 Chronic atrial fibrillation: Secondary | ICD-10-CM | POA: Diagnosis not present

## 2017-08-11 DIAGNOSIS — J9621 Acute and chronic respiratory failure with hypoxia: Secondary | ICD-10-CM | POA: Diagnosis not present

## 2017-08-11 NOTE — Progress Notes (Signed)
[facility] [place]   PULMONARY SERVICE  PROGRESS NOTE  Date of Service: 08/11/2017  Martha BustleGlenda W Coltrain  ZOX:[id number]  DOB: 1960/06/08   DOA: 08/10/2017  Referring Physician: Carron CurieAli Hijazi, MD  HPI: Martha Carter is a 57 y.o. female seen for follow up of Acute on Chronic Respiratory Failure.  Continues to do well on T collar has been now started on capping and is on room air no distress  Medications: Reviewed on Rounds  Physical Exam:  Vitals: Temperature 98.0 pulse 92 respiratory 18 blood pressure 125/67 saturations 98%  Ventilator Settings off the ventilator  . General: Comfortable at this time . Eyes: Grossly normal lids, irises & conjunctiva . ENT: grossly tongue is normal . Neck: no obvious mass . Cardiovascular: S1 S2 normal no gallop . Respiratory: No rhonchi or rales . Abdomen: soft . Skin: no rash seen on limited exam . Musculoskeletal: not rigid . Psychiatric:unable to assess . Neurologic: no seizure no involuntary movements         Lab Data:   Basic Metabolic Panel: Recent Labs  Lab 08/05/17 0615 08/08/17 0651 08/09/17 0527 08/09/17 2137  NA 139 137 136 135  K 4.3 4.3 3.8 3.9  CL 101 97* 99 98  CO2 28 28 27 26   GLUCOSE 141* 179* 110* 183*  BUN 46* 44* 49* 47*  CREATININE 0.94 1.02* 0.99 0.92  CALCIUM 10.0 10.2 9.9 10.2  MG 2.5* 2.3 2.3  --   PHOS  --  4.0 3.7  --     Liver Function Tests: Recent Labs  Lab 08/09/17 0527 08/09/17 2137  AST  --  22  ALT  --  29  ALKPHOS  --  95  BILITOT  --  1.3*  PROT  --  6.8  ALBUMIN 2.5* 2.7*   No results for input(s): LIPASE, AMYLASE in the last 168 hours. No results for input(s): AMMONIA in the last 168 hours.  CBC: Recent Labs  Lab 08/05/17 0615 08/08/17 0651 08/09/17 0527 08/09/17 2137  WBC 9.3 10.2 7.9 8.6  NEUTROABS  --   --   --  6.7  HGB 7.5* 6.8* 7.7* 8.1*  HCT 24.3* 21.3* 23.8* 25.2*  MCV 90.7 88.4 87.8 89.7  PLT 249 222 209 227     Cardiac Enzymes: No results for input(s): CKTOTAL, CKMB, CKMBINDEX, TROPONINI in the last 168 hours.  BNP (last 3 results) No results for input(s): BNP in the last 8760 hours.  ProBNP (last 3 results) No results for input(s): PROBNP in the last 8760 hours.  Radiological Exams: Dg Chest Port 1 View  Result Date: 08/09/2017 CLINICAL DATA:  Dyspnea. EXAM: PORTABLE CHEST 1 VIEW COMPARISON:  07/17/2017. FINDINGS: Tracheostomy tube in satisfactory position. Stable mildly enlarged cardiac silhouette. Patchy opacity at the left lung base with significant improvement. Small amount of residual loculated pleural fluid laterally on the left. Clear right lung the exception of minimal interval linear density in the mid lung zone. Stable median sternotomy wires. Unremarkable bones. IMPRESSION: 1. Improving left basilar probable pneumonia and pleural fluid. 2. Interval minimal linear atelectasis in the right mid lung zone. Electronically Signed   By: Beckie SaltsSteven  Reid M.D.   On: 08/09/2017 21:22    Assessment/Plan Principal Problem:   Acute on chronic respiratory failure with hypoxia (HCC) Active Problems:   Lobar pneumonia, unspecified organism [facility](HCC)   Coronary artery disease   Chronic atrial fibrillation (HCC)   1. Acute on chronic respiratory failure with hypoxia advance  weaning patient's capping now has been on room air 2. Lobar pneumonia treated with antibiotics we will continue with supportive care x-ray shows improvement 3. Coronary artery disease at baseline 4. Chronic atrial fibrillation rate is controlled   I have personally seen and evaluated the patient, evaluated laboratory and imaging results, formulated the assessment and plan and placed orders. The Patient requires high complexity decision making for assessment and support.  Case was discussed on Rounds with the Respiratory Therapy Staff  Yevonne Pax, MD [facility] Pulmonary Critical Care Medicine Sleep Medicine

## 2017-08-12 DIAGNOSIS — J9621 Acute and chronic respiratory failure with hypoxia: Secondary | ICD-10-CM | POA: Diagnosis not present

## 2017-08-12 DIAGNOSIS — J181 Lobar pneumonia, unspecified organism: Secondary | ICD-10-CM | POA: Diagnosis not present

## 2017-08-12 DIAGNOSIS — I2510 Atherosclerotic heart disease of native coronary artery without angina pectoris: Secondary | ICD-10-CM | POA: Diagnosis not present

## 2017-08-12 DIAGNOSIS — I482 Chronic atrial fibrillation: Secondary | ICD-10-CM | POA: Diagnosis not present

## 2017-08-12 NOTE — Progress Notes (Signed)
[facility] [place]   PULMONARY SERVICE  PROGRESS NOTE  Date of Service: 08/12/2017  Martha BustleGlenda W Stadler  [id number]  DOB: 05/04/1960   DOA: 08/10/2017  Referring Physician: Carron CurieAli Hijazi, MD  HPI: Martha BustleGlenda W Penafiel is a 57 y.o. female seen for follow up of Acute on Chronic Respiratory Failure.  Patient is on T collar right now 28% FiO2 comfortable without distress at this time.  Medications: Reviewed on Rounds  Physical Exam:  Vitals: Temperature 98.1 pulse 88 respiratory rate 21 blood pressure 129/71 saturations 91%  Ventilator Settings aerosolized T collar 28% FiO2  . General: Comfortable at this time . Eyes: Grossly normal lids, irises & conjunctiva . ENT: grossly tongue is normal . Neck: no obvious mass . Cardiovascular: S1 S2 normal no gallop . Respiratory: No rhonchi rales . Abdomen: soft . Skin: no rash seen on limited exam . Musculoskeletal: not rigid . Psychiatric:unable to assess . Neurologic: no seizure no involuntary movements         Lab Data:   Basic Metabolic Panel: Recent Labs  Lab 08/08/17 0651 08/09/17 0527 08/09/17 2137  NA 137 136 135  K 4.3 3.8 3.9  CL 97* 99 98  CO2 28 27 26   GLUCOSE 179* 110* 183*  BUN 44* 49* 47*  CREATININE 1.02* 0.99 0.92  CALCIUM 10.2 9.9 10.2  MG 2.3 2.3  --   PHOS 4.0 3.7  --     Liver Function Tests: Recent Labs  Lab 08/09/17 0527 08/09/17 2137  AST  --  22  ALT  --  29  ALKPHOS  --  95  BILITOT  --  1.3*  PROT  --  6.8  ALBUMIN 2.5* 2.7*   No results for input(s): LIPASE, AMYLASE in the last 168 hours. No results for input(s): AMMONIA in the last 168 hours.  CBC: Recent Labs  Lab 08/08/17 0651 08/09/17 0527 08/09/17 2137  WBC 10.2 7.9 8.6  NEUTROABS  --   --  6.7  HGB 6.8* 7.7* 8.1*  HCT 21.3* 23.8* 25.2*  MCV 88.4 87.8 89.7  PLT 222 209 227    Cardiac Enzymes: No results for input(s): CKTOTAL, CKMB, CKMBINDEX, TROPONINI in the last 168  hours.  BNP (last 3 results) No results for input(s): BNP in the last 8760 hours.  ProBNP (last 3 results) No results for input(s): PROBNP in the last 8760 hours.  Radiological Exams: No results found.  Assessment/Plan Principal Problem:   Acute on chronic respiratory failure with hypoxia (HCC) Active Problems:   Lobar pneumonia, unspecified organism [facility](HCC)   Coronary artery disease   Chronic atrial fibrillation (HCC)   1. Acute on chronic respiratory failure with hypoxia we will continue with T collar wean continue with supportive care patient has been tolerating capping and will advance today. 2. Lobar pneumonia treated resolved 3. Coronary artery disease stable 4. Chronic atrial fibrillation rate is controlled   I have personally seen and evaluated the patient, evaluated laboratory and imaging results, formulated the assessment and plan and placed orders. The Patient requires high complexity decision making for assessment and support.  Case was discussed on Rounds with the Respiratory Therapy Staff  Yevonne PaxSaadat A Khan, MD [facility] Pulmonary Critical Care Medicine Sleep Medicine

## 2017-08-13 DIAGNOSIS — I2510 Atherosclerotic heart disease of native coronary artery without angina pectoris: Secondary | ICD-10-CM | POA: Diagnosis not present

## 2017-08-13 DIAGNOSIS — J9621 Acute and chronic respiratory failure with hypoxia: Secondary | ICD-10-CM | POA: Diagnosis not present

## 2017-08-13 DIAGNOSIS — J181 Lobar pneumonia, unspecified organism: Secondary | ICD-10-CM | POA: Diagnosis not present

## 2017-08-13 DIAGNOSIS — I482 Chronic atrial fibrillation: Secondary | ICD-10-CM | POA: Diagnosis not present

## 2017-08-13 NOTE — Progress Notes (Signed)
[facility]   PULMONARY SERVICE  PROGRESS NOTE  Date of Service: 08/13/2017  Laurina BustleGlenda W Bryand  WUJ:[id number]:[id number]  DOB: 1960-03-08   DOA: 08/10/2017  Referring Physician: Carron CurieAli Hijazi, MD  HPI: Laurina BustleGlenda W Zmuda is a 57 y.o. female seen for follow up of Acute on Chronic Respiratory Failure.  Capping doing well.  No distress noted.  Medications: Reviewed on Rounds  Physical Exam:  Vitals: Temperature 97.4 pulse 82 respiratory 18 blood pressure 116/64 saturations 96%  Ventilator Settings on capping trials  . General: Comfortable at this time . Eyes: Grossly normal lids, irises & conjunctiva . ENT: grossly tongue is normal . Neck: no obvious mass . Cardiovascular: S1 S2 normal no gallop . Respiratory: No rhonchi rales . Abdomen: soft . Skin: no rash seen on limited exam . Musculoskeletal: not rigid . Psychiatric:unable to assess . Neurologic: no seizure no involuntary movements         Lab Data:   Basic Metabolic Panel: Recent Labs  Lab 08/08/17 0651 08/09/17 0527 08/09/17 2137  NA 137 136 135  K 4.3 3.8 3.9  CL 97* 99 98  CO2 28 27 26   GLUCOSE 179* 110* 183*  BUN 44* 49* 47*  CREATININE 1.02* 0.99 0.92  CALCIUM 10.2 9.9 10.2  MG 2.3 2.3  --   PHOS 4.0 3.7  --     Liver Function Tests: Recent Labs  Lab 08/09/17 0527 08/09/17 2137  AST  --  22  ALT  --  29  ALKPHOS  --  95  BILITOT  --  1.3*  PROT  --  6.8  ALBUMIN 2.5* 2.7*   No results for input(s): LIPASE, AMYLASE in the last 168 hours. No results for input(s): AMMONIA in the last 168 hours.  CBC: Recent Labs  Lab 08/08/17 0651 08/09/17 0527 08/09/17 2137  WBC 10.2 7.9 8.6  NEUTROABS  --   --  6.7  HGB 6.8* 7.7* 8.1*  HCT 21.3* 23.8* 25.2*  MCV 88.4 87.8 89.7  PLT 222 209 227    Cardiac Enzymes: No results for input(s): CKTOTAL, CKMB, CKMBINDEX, TROPONINI in the last 168 hours.  BNP (last 3 results) No results for input(s): BNP in  the last 8760 hours.  ProBNP (last 3 results) No results for input(s): PROBNP in the last 8760 hours.  Radiological Exams: No results found.  Assessment/Plan Principal Problem:   Acute on chronic respiratory failure with hypoxia (HCC) Active Problems:   Lobar pneumonia, unspecified organism [facility](HCC)   Coronary artery disease   Chronic atrial fibrillation (HCC)   1. Acute on chronic respiratory failure with hypoxia we will continue with capping trials as ordered.  Continue secretion management pulmonary toilet 2. Lobar pneumonia treated with antibiotics we will continue to follow 3. Coronary disease stable 4. Chronic atrial fibrillation rate is controlled   I have personally seen and evaluated the patient, evaluated laboratory and imaging results, formulated the assessment and plan and placed orders. The Patient requires high complexity decision making for assessment and support.  Case was discussed on Rounds with the Respiratory Therapy Staff  Yevonne PaxSaadat A Khan, MD [facility] Pulmonary Critical Care Medicine Sleep Medicine

## 2017-08-14 DIAGNOSIS — J181 Lobar pneumonia, unspecified organism: Secondary | ICD-10-CM | POA: Diagnosis not present

## 2017-08-14 DIAGNOSIS — J9621 Acute and chronic respiratory failure with hypoxia: Secondary | ICD-10-CM | POA: Diagnosis not present

## 2017-08-14 DIAGNOSIS — I482 Chronic atrial fibrillation: Secondary | ICD-10-CM | POA: Diagnosis not present

## 2017-08-14 DIAGNOSIS — I2510 Atherosclerotic heart disease of native coronary artery without angina pectoris: Secondary | ICD-10-CM | POA: Diagnosis not present

## 2017-08-14 LAB — CBC
HEMATOCRIT: 22.6 % — AB (ref 36.0–46.0)
Hemoglobin: 7.3 g/dL — ABNORMAL LOW (ref 12.0–15.0)
MCH: 28.9 pg (ref 26.0–34.0)
MCHC: 32.3 g/dL (ref 30.0–36.0)
MCV: 89.3 fL (ref 78.0–100.0)
Platelets: 193 10*3/uL (ref 150–400)
RBC: 2.53 MIL/uL — AB (ref 3.87–5.11)
RDW: 18.8 % — ABNORMAL HIGH (ref 11.5–15.5)
WBC: 9 10*3/uL (ref 4.0–10.5)

## 2017-08-14 LAB — BASIC METABOLIC PANEL
ANION GAP: 13 (ref 5–15)
BUN: 64 mg/dL — ABNORMAL HIGH (ref 6–20)
CHLORIDE: 98 mmol/L (ref 98–111)
CO2: 23 mmol/L (ref 22–32)
CREATININE: 1.22 mg/dL — AB (ref 0.44–1.00)
Calcium: 10 mg/dL (ref 8.9–10.3)
GFR calc non Af Amer: 48 mL/min — ABNORMAL LOW (ref >60)
GFR, EST AFRICAN AMERICAN: 56 mL/min — AB (ref >60)
Glucose, Bld: 235 mg/dL — ABNORMAL HIGH (ref 70–99)
POTASSIUM: 4.4 mmol/L (ref 3.5–5.1)
Sodium: 134 mmol/L — ABNORMAL LOW (ref 135–145)

## 2017-08-14 LAB — MAGNESIUM: Magnesium: 2 mg/dL (ref 1.7–2.4)

## 2017-08-14 LAB — PHOSPHORUS: Phosphorus: 3.2 mg/dL (ref 2.5–4.6)

## 2017-08-14 NOTE — Progress Notes (Signed)
[facility] Critical [facility]   PULMONARY SERVICE  PROGRESS NOTE  Date of Service: 08/14/2017  Martha Carter  [id number]:[id number]  DOB: 06/18/1960   DOA: 08/10/2017  Referring Physician: Carron CurieAli Hijazi, MD  HPI: Martha BustleGlenda W Hurley is a 57 y.o. female seen for follow up of Acute on Chronic Respiratory Failure.  Currently is weaning doing fairly well.  Patient has been tolerating capping.  Spoke to the patient explained to her that we can continue to proceed towards decannulation however she does not want to wait and she wants to be discharged as soon as possible.  Medications: Reviewed on Rounds  Physical Exam:  Vitals: Temperature 98.4 pulse 98 respiratory rate 21 blood pressure 126/65 saturations 98%  Ventilator Settings capping doing well  . General: Comfortable at this time . Eyes: Grossly normal lids, irises & conjunctiva . ENT: grossly tongue is normal . Neck: no obvious mass . Cardiovascular: S1 S2 normal no gallop . Respiratory: No rhonchi rales are noted . Abdomen: soft . Skin: no rash seen on limited exam . Musculoskeletal: not rigid . Psychiatric:unable to assess . Neurologic: no seizure no involuntary movements         Lab Data:   Basic Metabolic Panel: Recent Labs  Lab 08/08/17 0651 08/09/17 0527 08/09/17 2137 08/14/17 0439  NA 137 136 135 134*  K 4.3 3.8 3.9 4.4  CL 97* 99 98 98  CO2 28 27 26 23   GLUCOSE 179* 110* 183* 235*  BUN 44* 49* 47* 64*  CREATININE 1.02* 0.99 0.92 1.22*  CALCIUM 10.2 9.9 10.2 10.0  MG 2.3 2.3  --  2.0  PHOS 4.0 3.7  --  3.2    Liver Function Tests: Recent Labs  Lab 08/09/17 0527 08/09/17 2137  AST  --  22  ALT  --  29  ALKPHOS  --  95  BILITOT  --  1.3*  PROT  --  6.8  ALBUMIN 2.5* 2.7*   No results for input(s): LIPASE, AMYLASE in the last 168 hours. No results for input(s): AMMONIA in the last 168 hours.  CBC: Recent Labs  Lab 08/08/17 0651 08/09/17 0527 08/09/17 2137  08/14/17 0439  WBC 10.2 7.9 8.6 9.0  NEUTROABS  --   --  6.7  --   HGB 6.8* 7.7* 8.1* 7.3*  HCT 21.3* 23.8* 25.2* 22.6*  MCV 88.4 87.8 89.7 89.3  PLT 222 209 227 193    Cardiac Enzymes: No results for input(s): CKTOTAL, CKMB, CKMBINDEX, TROPONINI in the last 168 hours.  BNP (last 3 results) No results for input(s): BNP in the last 8760 hours.  ProBNP (last 3 results) No results for input(s): PROBNP in the last 8760 hours.  Radiological Exams: No results found.  Assessment/Plan Principal Problem:   Acute on chronic respiratory failure with hypoxia (HCC) Active Problems:   Lobar pneumonia, unspecified organism [facility](HCC)   Coronary artery disease   Chronic atrial fibrillation (HCC)   1. Acute on chronic respiratory failure with hypoxia we will continue with capping trials as ordered continue pulmonary toilet secretion management. 2. Lobar pneumonia treated continue to follow 3. Coronary disease at baseline 4. Chronic atrial fibrillation rate is controlled   I have personally seen and evaluated the patient, evaluated laboratory and imaging results, formulated the assessment and plan and placed orders. The Patient requires high complexity decision making for assessment and support.  Case was discussed on Rounds with the Respiratory Therapy Staff  Yevonne PaxSaadat A Mykira Hofmeister, MD [facility] Pulmonary  Critical Care Medicine Sleep Medicine

## 2017-08-15 DIAGNOSIS — I482 Chronic atrial fibrillation: Secondary | ICD-10-CM | POA: Diagnosis not present

## 2017-08-15 DIAGNOSIS — I2510 Atherosclerotic heart disease of native coronary artery without angina pectoris: Secondary | ICD-10-CM | POA: Diagnosis not present

## 2017-08-15 DIAGNOSIS — J181 Lobar pneumonia, unspecified organism: Secondary | ICD-10-CM | POA: Diagnosis not present

## 2017-08-15 DIAGNOSIS — J9621 Acute and chronic respiratory failure with hypoxia: Secondary | ICD-10-CM | POA: Diagnosis not present

## 2017-08-15 LAB — BASIC METABOLIC PANEL WITH GFR
Anion gap: 9 (ref 5–15)
BUN: 63 mg/dL — ABNORMAL HIGH (ref 6–20)
CO2: 27 mmol/L (ref 22–32)
Calcium: 10.3 mg/dL (ref 8.9–10.3)
Chloride: 98 mmol/L (ref 98–111)
Creatinine, Ser: 1.11 mg/dL — ABNORMAL HIGH (ref 0.44–1.00)
GFR calc Af Amer: >60 mL/min (ref >60)
GFR calc non Af Amer: 54 mL/min — ABNORMAL LOW (ref >60)
Glucose, Bld: 173 mg/dL — ABNORMAL HIGH (ref 70–99)
Potassium: 5.4 mmol/L — ABNORMAL HIGH (ref 3.5–5.1)
Sodium: 134 mmol/L — ABNORMAL LOW (ref 135–145)

## 2017-08-15 LAB — POTASSIUM: POTASSIUM: 4.8 mmol/L (ref 3.5–5.1)

## 2017-08-15 NOTE — Progress Notes (Signed)
[facility] Critical [facility]   PULMONARY SERVICE  PROGRESS NOTE  Date of Service: 08/15/2017  Martha Carter  WGN:[id number]:[id number]  DOB: 03-05-1960   DOA: 08/10/2017  Referring Physician: Carron CurieAli Hijazi, MD  HPI: Martha Carter is a 57 y.o. female seen for follow up of Acute on Chronic Respiratory Failure.  She is capping doing well.  Has been capped for more than 48 hours.  Medications: Reviewed on Rounds  Physical Exam:  Vitals: Temperature 98.0 pulse 101 respiratory 23 blood pressure 126/60 saturations 99%  Ventilator Settings continue with capping  . General: Comfortable at this time . Eyes: Grossly normal lids, irises & conjunctiva . ENT: grossly tongue is normal . Neck: no obvious mass . Cardiovascular: S1 S2 normal no gallop . Respiratory: No rhonchi or rales are noted . Abdomen: soft . Skin: no rash seen on limited exam . Musculoskeletal: not rigid . Psychiatric:unable to assess . Neurologic: no seizure no involuntary movements         Lab Data:   Basic Metabolic Panel: Recent Labs  Lab 08/09/17 0527 08/09/17 2137 08/14/17 0439 08/15/17 0554  NA 136 135 134* 134*  K 3.8 3.9 4.4 5.4*  CL 99 98 98 98  CO2 27 26 23 27   GLUCOSE 110* 183* 235* 173*  BUN 49* 47* 64* 63*  CREATININE 0.99 0.92 1.22* 1.11*  CALCIUM 9.9 10.2 10.0 10.3  MG 2.3  --  2.0  --   PHOS 3.7  --  3.2  --     Liver Function Tests: Recent Labs  Lab 08/09/17 0527 08/09/17 2137  AST  --  22  ALT  --  29  ALKPHOS  --  95  BILITOT  --  1.3*  PROT  --  6.8  ALBUMIN 2.5* 2.7*   No results for input(s): LIPASE, AMYLASE in the last 168 hours. No results for input(s): AMMONIA in the last 168 hours.  CBC: Recent Labs  Lab 08/09/17 0527 08/09/17 2137 08/14/17 0439  WBC 7.9 8.6 9.0  NEUTROABS  --  6.7  --   HGB 7.7* 8.1* 7.3*  HCT 23.8* 25.2* 22.6*  MCV 87.8 89.7 89.3  PLT 209 227 193    Cardiac Enzymes: No results for input(s): CKTOTAL,  CKMB, CKMBINDEX, TROPONINI in the last 168 hours.  BNP (last 3 results) No results for input(s): BNP in the last 8760 hours.  ProBNP (last 3 results) No results for input(s): PROBNP in the last 8760 hours.  Radiological Exams: No results found.  Assessment/Plan Principal Problem:   Acute on chronic respiratory failure with hypoxia (HCC) Active Problems:   Lobar pneumonia, unspecified organism [facility](HCC)   Coronary artery disease   Chronic atrial fibrillation (HCC)   1. Acute on chronic respiratory failure with hypoxia we will continue with advancing the caps as tolerated hopefully we can proceed towards decannulation. 2. Lobar pneumonia treated with antibiotics continue present supportive care 3. Coronary artery disease stable 4. Chronic atrial fibrillation rate is been controlled   I have personally seen and evaluated the patient, evaluated laboratory and imaging results, formulated the assessment and plan and placed orders. The Patient requires high complexity decision making for assessment and support.  Case was discussed on Rounds with the Respiratory Therapy Staff  Yevonne PaxSaadat A Laniyah Rosenwald, MD [facility] Pulmonary Critical Care Medicine Sleep Medicine

## 2017-08-16 DIAGNOSIS — J181 Lobar pneumonia, unspecified organism: Secondary | ICD-10-CM | POA: Diagnosis not present

## 2017-08-16 DIAGNOSIS — J9621 Acute and chronic respiratory failure with hypoxia: Secondary | ICD-10-CM | POA: Diagnosis not present

## 2017-08-16 DIAGNOSIS — I2510 Atherosclerotic heart disease of native coronary artery without angina pectoris: Secondary | ICD-10-CM | POA: Diagnosis not present

## 2017-08-16 DIAGNOSIS — I482 Chronic atrial fibrillation: Secondary | ICD-10-CM | POA: Diagnosis not present

## 2017-08-16 NOTE — Progress Notes (Signed)
[facility]   PULMONARY SERVICE  PROGRESS NOTE  Date of Service: 08/16/2017  Martha Carter  HYQ:[id number]:[id number]  DOB: Nov 02, 1960   DOA: 08/10/2017  Referring Physician: Carron CurieAli Hijazi, MD  HPI: Martha Carter is a 57 y.o. female seen for follow up of Acute on Chronic Respiratory Failure.  Patient is comfortable without distress.  Has been capped for 72 hours  Medications: Reviewed on Rounds  Physical Exam:  Vitals: Temperature 98.0 pulse 97 respiratory rate 21 blood pressure 140/78 saturation 100%  Ventilator Settings capping doing well for 72 hours  . General: Comfortable at this time . Eyes: Grossly normal lids, irises & conjunctiva . ENT: grossly tongue is normal . Neck: no obvious mass . Cardiovascular: S1 S2 normal no gallop . Respiratory: No rhonchi or rales . Abdomen: soft . Skin: no rash seen on limited exam . Musculoskeletal: not rigid . Psychiatric:unable to assess . Neurologic: no seizure no involuntary movements         Lab Data:   Basic Metabolic Panel: Recent Labs  Lab 08/09/17 2137 08/14/17 0439 08/15/17 0554 08/15/17 2049  NA 135 134* 134*  --   K 3.9 4.4 5.4* 4.8  CL 98 98 98  --   CO2 26 23 27   --   GLUCOSE 183* 235* 173*  --   BUN 47* 64* 63*  --   CREATININE 0.92 1.22* 1.11*  --   CALCIUM 10.2 10.0 10.3  --   MG  --  2.0  --   --   PHOS  --  3.2  --   --     Liver Function Tests: Recent Labs  Lab 08/09/17 2137  AST 22  ALT 29  ALKPHOS 95  BILITOT 1.3*  PROT 6.8  ALBUMIN 2.7*   No results for input(s): LIPASE, AMYLASE in the last 168 hours. No results for input(s): AMMONIA in the last 168 hours.  CBC: Recent Labs  Lab 08/09/17 2137 08/14/17 0439  WBC 8.6 9.0  NEUTROABS 6.7  --   HGB 8.1* 7.3*  HCT 25.2* 22.6*  MCV 89.7 89.3  PLT 227 193    Cardiac Enzymes: No results for input(s): CKTOTAL, CKMB, CKMBINDEX, TROPONINI in the last 168 hours.  BNP (last 3  results) No results for input(s): BNP in the last 8760 hours.  ProBNP (last 3 results) No results for input(s): PROBNP in the last 8760 hours.  Radiological Exams: No results found.  Assessment/Plan Principal Problem:   Acute on chronic respiratory failure with hypoxia (HCC) Active Problems:   Lobar pneumonia, unspecified organism [facility](HCC)   Coronary artery disease   Chronic atrial fibrillation (HCC)   1. Acute on chronic respiratory failure with hypoxia she is been capped for more than 72 hours proceed to decannulation.  Secretions are minimal and she has a good strong cough. 2. Lobar pneumonia treated resolved 3. Coronary disease stable at baseline 4. Chronic atrial fibrillation rate is controlled   I have personally seen and evaluated the patient, evaluated laboratory and imaging results, formulated the assessment and plan and placed orders. The Patient requires high complexity decision making for assessment and support.  Case was discussed on Rounds with the Respiratory Therapy Staff  Yevonne PaxSaadat A Khan, MD [facility] Pulmonary Critical Care Medicine Sleep Medicine

## 2017-08-17 DIAGNOSIS — J181 Lobar pneumonia, unspecified organism: Secondary | ICD-10-CM | POA: Diagnosis not present

## 2017-08-17 DIAGNOSIS — J9621 Acute and chronic respiratory failure with hypoxia: Secondary | ICD-10-CM | POA: Diagnosis not present

## 2017-08-17 DIAGNOSIS — I2510 Atherosclerotic heart disease of native coronary artery without angina pectoris: Secondary | ICD-10-CM | POA: Diagnosis not present

## 2017-08-17 DIAGNOSIS — I482 Chronic atrial fibrillation: Secondary | ICD-10-CM | POA: Diagnosis not present

## 2017-08-17 LAB — CBC
HEMATOCRIT: 23.5 % — AB (ref 36.0–46.0)
Hemoglobin: 7.5 g/dL — ABNORMAL LOW (ref 12.0–15.0)
MCH: 29 pg (ref 26.0–34.0)
MCHC: 31.9 g/dL (ref 30.0–36.0)
MCV: 90.7 fL (ref 78.0–100.0)
PLATELETS: 162 10*3/uL (ref 150–400)
RBC: 2.59 MIL/uL — ABNORMAL LOW (ref 3.87–5.11)
RDW: 19.5 % — AB (ref 11.5–15.5)
WBC: 7.3 10*3/uL (ref 4.0–10.5)

## 2017-08-17 LAB — BASIC METABOLIC PANEL
Anion gap: 9 (ref 5–15)
BUN: 56 mg/dL — AB (ref 6–20)
CALCIUM: 10 mg/dL (ref 8.9–10.3)
CO2: 26 mmol/L (ref 22–32)
CREATININE: 1.08 mg/dL — AB (ref 0.44–1.00)
Chloride: 100 mmol/L (ref 98–111)
GFR calc Af Amer: >60 mL/min (ref >60)
GFR, EST NON AFRICAN AMERICAN: 56 mL/min — AB (ref >60)
GLUCOSE: 164 mg/dL — AB (ref 70–99)
POTASSIUM: 4.1 mmol/L (ref 3.5–5.1)
SODIUM: 135 mmol/L (ref 135–145)

## 2017-08-17 NOTE — Progress Notes (Signed)
[facility]UL[facility] SERVICE  PROGRESS NOTE  Date of Service: 08/17/2017  Martha Carter  WUX:[id number]:[id number]  DOB: 24-May-1960   DOA: 08/10/2017  Referring Physician: Carron CurieAli Hijazi, MD  HPI: Martha Carter is a 57 y.o. female seen for follow up of Acute on Chronic Respiratory Failure.  Patient was decannulated she looks good.  Right now is on room air awaiting discharge  Medications: Reviewed on Rounds  Physical Exam:  Vitals: Temperature 97.3 pulse 89 respiratory rate 17 blood pressure 131/55 saturations 100%  Ventilator Settings decannulated  . General: Comfortable at this time . Eyes: Grossly normal lids, irises & conjunctiva . ENT: grossly tongue is normal . Neck: no obvious mass . Cardiovascular: S1 S2 normal no gallop . Respiratory: No rhonchi . Abdomen: soft . Skin: no rash seen on limited exam . Musculoskeletal: not rigid . Psychiatric:unable to assess . Neurologic: no seizure no involuntary movements         Lab Data:   Basic Metabolic Panel: Recent Labs  Lab 08/14/17 0439 08/15/17 0554 08/15/17 2049 08/17/17 0555  NA 134* 134*  --  135  K 4.4 5.4* 4.8 4.1  CL 98 98  --  100  CO2 23 27  --  26  GLUCOSE 235* 173*  --  164*  BUN 64* 63*  --  56*  CREATININE 1.22* 1.11*  --  1.08*  CALCIUM 10.0 10.3  --  10.0  MG 2.0  --   --   --   PHOS 3.2  --   --   --     Liver Function Tests: No results for input(s): AST, ALT, ALKPHOS, BILITOT, PROT, ALBUMIN in the last 168 hours. No results for input(s): LIPASE, AMYLASE in the last 168 hours. No results for input(s): AMMONIA in the last 168 hours.  CBC: Recent Labs  Lab 08/14/17 0439 08/17/17 0555  WBC 9.0 7.3  HGB 7.3* 7.5*  HCT 22.6* 23.5*  MCV 89.3 90.7  PLT 193 162    Cardiac Enzymes: No results for input(s): CKTOTAL, CKMB, CKMBINDEX, TROPONINI in the last 168 hours.  BNP (last 3 results) No results for input(s): BNP in the last 8760  hours.  ProBNP (last 3 results) No results for input(s): PROBNP in the last 8760 hours.  Radiological Exams: No results found.  Assessment/Plan Principal Problem:   Acute on chronic respiratory failure with hypoxia (HCC) Active Problems:   Lobar pneumonia, unspecified organism [facility](HCC)   Coronary artery disease   Chronic atrial fibrillation (HCC)   1. Acute on chronic respiratory failure with hypoxia resolved continue present management 2. Lobar pneumonia resolved stable at this time will monitor 3. Coronary disease no active chest pain 4. Chronic atrial fibrillation rate is controlled   I have personally seen and evaluated the patient, evaluated laboratory and imaging results, formulated the assessment and plan and placed orders. The Patient requires high complexity decision making for assessment and support.  Case was discussed on Rounds with the Respiratory Therapy Staff  Yevonne PaxSaadat A Marsena Taff, MD [facility]FCCP Pulmonary Critical Care Medicine Sleep Medicine

## 2017-08-20 LAB — BASIC METABOLIC PANEL WITH GFR
Anion gap: 10 (ref 5–15)
BUN: 56 mg/dL — ABNORMAL HIGH (ref 6–20)
CO2: 25 mmol/L (ref 22–32)
Calcium: 10.1 mg/dL (ref 8.9–10.3)
Chloride: 102 mmol/L (ref 98–111)
Creatinine, Ser: 1.02 mg/dL — ABNORMAL HIGH (ref 0.44–1.00)
GFR calc Af Amer: >60 mL/min
GFR calc non Af Amer: 60 mL/min — ABNORMAL LOW
Glucose, Bld: 84 mg/dL (ref 70–99)
Potassium: 4.1 mmol/L (ref 3.5–5.1)
Sodium: 137 mmol/L (ref 135–145)

## 2018-12-15 ENCOUNTER — Inpatient Hospital Stay (HOSPITAL_COMMUNITY)
Admission: EM | Admit: 2018-12-15 | Discharge: 2018-12-26 | DRG: 177 | Disposition: A | Discharge status: Skilled Nursing Facility | Payer: Medicare Other | Source: Skilled Nursing Facility | Attending: Internal Medicine | Admitting: Internal Medicine

## 2018-12-15 ENCOUNTER — Emergency Department (HOSPITAL_COMMUNITY): Payer: Medicare Other

## 2018-12-15 ENCOUNTER — Other Ambulatory Visit: Payer: Self-pay

## 2018-12-15 ENCOUNTER — Encounter (HOSPITAL_COMMUNITY): Payer: Self-pay | Admitting: Emergency Medicine

## 2018-12-15 DIAGNOSIS — Z89511 Acquired absence of right leg below knee: Secondary | ICD-10-CM | POA: Diagnosis not present

## 2018-12-15 DIAGNOSIS — D696 Thrombocytopenia, unspecified: Secondary | ICD-10-CM | POA: Diagnosis present

## 2018-12-15 DIAGNOSIS — I130 Hypertensive heart and chronic kidney disease with heart failure and stage 1 through stage 4 chronic kidney disease, or unspecified chronic kidney disease: Secondary | ICD-10-CM

## 2018-12-15 DIAGNOSIS — G894 Chronic pain syndrome: Secondary | ICD-10-CM | POA: Diagnosis present

## 2018-12-15 DIAGNOSIS — R300 Dysuria: Secondary | ICD-10-CM | POA: Diagnosis not present

## 2018-12-15 DIAGNOSIS — U071 COVID-19: Principal | ICD-10-CM | POA: Diagnosis present

## 2018-12-15 DIAGNOSIS — F329 Major depressive disorder, single episode, unspecified: Secondary | ICD-10-CM

## 2018-12-15 DIAGNOSIS — I509 Heart failure, unspecified: Secondary | ICD-10-CM | POA: Diagnosis not present

## 2018-12-15 DIAGNOSIS — Z87891 Personal history of nicotine dependence: Secondary | ICD-10-CM | POA: Diagnosis not present

## 2018-12-15 DIAGNOSIS — R0600 Dyspnea, unspecified: Secondary | ICD-10-CM

## 2018-12-15 DIAGNOSIS — R161 Splenomegaly, not elsewhere classified: Secondary | ICD-10-CM | POA: Diagnosis not present

## 2018-12-15 DIAGNOSIS — E1159 Type 2 diabetes mellitus with other circulatory complications: Secondary | ICD-10-CM | POA: Diagnosis not present

## 2018-12-15 DIAGNOSIS — Z794 Long term (current) use of insulin: Secondary | ICD-10-CM

## 2018-12-15 DIAGNOSIS — N182 Chronic kidney disease, stage 2 (mild): Secondary | ICD-10-CM | POA: Diagnosis not present

## 2018-12-15 DIAGNOSIS — Z881 Allergy status to other antibiotic agents status: Secondary | ICD-10-CM

## 2018-12-15 DIAGNOSIS — E1142 Type 2 diabetes mellitus with diabetic polyneuropathy: Secondary | ICD-10-CM | POA: Diagnosis present

## 2018-12-15 DIAGNOSIS — J441 Chronic obstructive pulmonary disease with (acute) exacerbation: Secondary | ICD-10-CM | POA: Diagnosis present

## 2018-12-15 DIAGNOSIS — I5033 Acute on chronic diastolic (congestive) heart failure: Secondary | ICD-10-CM | POA: Diagnosis present

## 2018-12-15 DIAGNOSIS — F418 Other specified anxiety disorders: Secondary | ICD-10-CM | POA: Diagnosis present

## 2018-12-15 DIAGNOSIS — J1289 Other viral pneumonia: Secondary | ICD-10-CM | POA: Diagnosis present

## 2018-12-15 DIAGNOSIS — K219 Gastro-esophageal reflux disease without esophagitis: Secondary | ICD-10-CM | POA: Diagnosis present

## 2018-12-15 DIAGNOSIS — D631 Anemia in chronic kidney disease: Secondary | ICD-10-CM | POA: Diagnosis present

## 2018-12-15 DIAGNOSIS — T501X5A Adverse effect of loop [high-ceiling] diuretics, initial encounter: Secondary | ICD-10-CM | POA: Diagnosis not present

## 2018-12-15 DIAGNOSIS — R0602 Shortness of breath: Secondary | ICD-10-CM | POA: Diagnosis present

## 2018-12-15 DIAGNOSIS — N390 Urinary tract infection, site not specified: Secondary | ICD-10-CM | POA: Diagnosis present

## 2018-12-15 DIAGNOSIS — K3184 Gastroparesis: Secondary | ICD-10-CM | POA: Diagnosis present

## 2018-12-15 DIAGNOSIS — I2510 Atherosclerotic heart disease of native coronary artery without angina pectoris: Secondary | ICD-10-CM | POA: Diagnosis present

## 2018-12-15 DIAGNOSIS — I480 Paroxysmal atrial fibrillation: Secondary | ICD-10-CM | POA: Diagnosis present

## 2018-12-15 DIAGNOSIS — K5900 Constipation, unspecified: Secondary | ICD-10-CM | POA: Diagnosis present

## 2018-12-15 DIAGNOSIS — F419 Anxiety disorder, unspecified: Secondary | ICD-10-CM

## 2018-12-15 DIAGNOSIS — R160 Hepatomegaly, not elsewhere classified: Secondary | ICD-10-CM | POA: Diagnosis not present

## 2018-12-15 DIAGNOSIS — E1122 Type 2 diabetes mellitus with diabetic chronic kidney disease: Secondary | ICD-10-CM | POA: Diagnosis not present

## 2018-12-15 DIAGNOSIS — Z951 Presence of aortocoronary bypass graft: Secondary | ICD-10-CM | POA: Diagnosis not present

## 2018-12-15 DIAGNOSIS — Z7901 Long term (current) use of anticoagulants: Secondary | ICD-10-CM

## 2018-12-15 DIAGNOSIS — I739 Peripheral vascular disease, unspecified: Secondary | ICD-10-CM

## 2018-12-15 DIAGNOSIS — N179 Acute kidney failure, unspecified: Secondary | ICD-10-CM | POA: Diagnosis not present

## 2018-12-15 DIAGNOSIS — J440 Chronic obstructive pulmonary disease with acute lower respiratory infection: Secondary | ICD-10-CM | POA: Diagnosis present

## 2018-12-15 DIAGNOSIS — L89892 Pressure ulcer of other site, stage 2: Secondary | ICD-10-CM | POA: Diagnosis present

## 2018-12-15 DIAGNOSIS — D638 Anemia in other chronic diseases classified elsewhere: Secondary | ICD-10-CM

## 2018-12-15 DIAGNOSIS — J069 Acute upper respiratory infection, unspecified: Secondary | ICD-10-CM | POA: Diagnosis present

## 2018-12-15 DIAGNOSIS — E1169 Type 2 diabetes mellitus with other specified complication: Secondary | ICD-10-CM | POA: Diagnosis present

## 2018-12-15 DIAGNOSIS — E119 Type 2 diabetes mellitus without complications: Secondary | ICD-10-CM | POA: Diagnosis not present

## 2018-12-15 DIAGNOSIS — J449 Chronic obstructive pulmonary disease, unspecified: Secondary | ICD-10-CM | POA: Diagnosis not present

## 2018-12-15 DIAGNOSIS — J9601 Acute respiratory failure with hypoxia: Secondary | ICD-10-CM | POA: Diagnosis not present

## 2018-12-15 DIAGNOSIS — N1831 Chronic kidney disease, stage 3a: Secondary | ICD-10-CM | POA: Diagnosis present

## 2018-12-15 DIAGNOSIS — R609 Edema, unspecified: Secondary | ICD-10-CM

## 2018-12-15 DIAGNOSIS — B9620 Unspecified Escherichia coli [E. coli] as the cause of diseases classified elsewhere: Secondary | ICD-10-CM | POA: Diagnosis present

## 2018-12-15 DIAGNOSIS — Z79899 Other long term (current) drug therapy: Secondary | ICD-10-CM | POA: Diagnosis not present

## 2018-12-15 DIAGNOSIS — L8992 Pressure ulcer of unspecified site, stage 2: Secondary | ICD-10-CM | POA: Diagnosis not present

## 2018-12-15 DIAGNOSIS — Z888 Allergy status to other drugs, medicaments and biological substances status: Secondary | ICD-10-CM

## 2018-12-15 DIAGNOSIS — B952 Enterococcus as the cause of diseases classified elsewhere: Secondary | ICD-10-CM | POA: Diagnosis present

## 2018-12-15 DIAGNOSIS — R162 Hepatomegaly with splenomegaly, not elsewhere classified: Secondary | ICD-10-CM | POA: Diagnosis present

## 2018-12-15 DIAGNOSIS — L8990 Pressure ulcer of unspecified site, unspecified stage: Secondary | ICD-10-CM | POA: Insufficient documentation

## 2018-12-15 DIAGNOSIS — E1151 Type 2 diabetes mellitus with diabetic peripheral angiopathy without gangrene: Secondary | ICD-10-CM | POA: Diagnosis present

## 2018-12-15 DIAGNOSIS — E1165 Type 2 diabetes mellitus with hyperglycemia: Secondary | ICD-10-CM | POA: Diagnosis present

## 2018-12-15 DIAGNOSIS — I152 Hypertension secondary to endocrine disorders: Secondary | ICD-10-CM

## 2018-12-15 DIAGNOSIS — Z885 Allergy status to narcotic agent status: Secondary | ICD-10-CM

## 2018-12-15 DIAGNOSIS — J9611 Chronic respiratory failure with hypoxia: Secondary | ICD-10-CM

## 2018-12-15 DIAGNOSIS — R519 Headache, unspecified: Secondary | ICD-10-CM | POA: Diagnosis present

## 2018-12-15 DIAGNOSIS — E1143 Type 2 diabetes mellitus with diabetic autonomic (poly)neuropathy: Secondary | ICD-10-CM | POA: Diagnosis present

## 2018-12-15 DIAGNOSIS — Z9981 Dependence on supplemental oxygen: Secondary | ICD-10-CM

## 2018-12-15 DIAGNOSIS — I10 Essential (primary) hypertension: Secondary | ICD-10-CM

## 2018-12-15 DIAGNOSIS — Z91040 Latex allergy status: Secondary | ICD-10-CM

## 2018-12-15 LAB — CBC WITH DIFFERENTIAL/PLATELET
Abs Immature Granulocytes: 0.02 10*3/uL (ref 0.00–0.07)
Basophils Absolute: 0 10*3/uL (ref 0.0–0.1)
Basophils Relative: 0 %
Eosinophils Absolute: 0 10*3/uL (ref 0.0–0.5)
Eosinophils Relative: 0 %
HCT: 22 % — ABNORMAL LOW (ref 36.0–46.0)
Hemoglobin: 7.1 g/dL — ABNORMAL LOW (ref 12.0–15.0)
Immature Granulocytes: 0 %
Lymphocytes Relative: 11 %
Lymphs Abs: 0.6 10*3/uL — ABNORMAL LOW (ref 0.7–4.0)
MCH: 30 pg (ref 26.0–34.0)
MCHC: 32.3 g/dL (ref 30.0–36.0)
MCV: 92.8 fL (ref 80.0–100.0)
Monocytes Absolute: 0.3 10*3/uL (ref 0.1–1.0)
Monocytes Relative: 6 %
Neutro Abs: 4.2 10*3/uL (ref 1.7–7.7)
Neutrophils Relative %: 83 %
Platelets: 130 10*3/uL — ABNORMAL LOW (ref 150–400)
RBC: 2.37 MIL/uL — ABNORMAL LOW (ref 3.87–5.11)
RDW: 18 % — ABNORMAL HIGH (ref 11.5–15.5)
WBC: 5.1 10*3/uL (ref 4.0–10.5)
nRBC: 0 % (ref 0.0–0.2)

## 2018-12-15 LAB — COMPREHENSIVE METABOLIC PANEL
ALT: 20 U/L (ref 0–44)
AST: 15 U/L (ref 15–41)
Albumin: 3.1 g/dL — ABNORMAL LOW (ref 3.5–5.0)
Alkaline Phosphatase: 110 U/L (ref 38–126)
Anion gap: 11 (ref 5–15)
BUN: 58 mg/dL — ABNORMAL HIGH (ref 6–20)
CO2: 23 mmol/L (ref 22–32)
Calcium: 9.4 mg/dL (ref 8.9–10.3)
Chloride: 106 mmol/L (ref 98–111)
Creatinine, Ser: 1.65 mg/dL — ABNORMAL HIGH (ref 0.44–1.00)
GFR calc Af Amer: 39 mL/min — ABNORMAL LOW (ref >60)
GFR calc non Af Amer: 34 mL/min — ABNORMAL LOW (ref >60)
Glucose, Bld: 262 mg/dL — ABNORMAL HIGH (ref 70–99)
Potassium: 3.8 mmol/L (ref 3.5–5.1)
Sodium: 140 mmol/L (ref 135–145)
Total Bilirubin: 1.1 mg/dL (ref 0.3–1.2)
Total Protein: 6.3 g/dL — ABNORMAL LOW (ref 6.5–8.1)

## 2018-12-15 LAB — BRAIN NATRIURETIC PEPTIDE: B Natriuretic Peptide: 370 pg/mL — ABNORMAL HIGH (ref 0.0–100.0)

## 2018-12-15 MED ORDER — ARMOUR THYROID 30 MG PO TABS
8.00 | ORAL_TABLET | ORAL | Status: DC
Start: ? — End: 2018-12-15

## 2018-12-15 MED ORDER — FUROSEMIDE 10 MG/ML IJ SOLN
60.0000 mg | Freq: Once | INTRAMUSCULAR | Status: AC
  Administered 2018-12-15: 60 mg via INTRAVENOUS
  Filled 2018-12-15: qty 8

## 2018-12-15 NOTE — H&P (Signed)
History and Physical    Martha Carter PZW:[id number] DOB: [birth year] DOA: 12/15/2018  PCP: System, Pcp Not In  Patient coming from: [place]  I have personally briefly reviewed patient's old medical records in [place]  Chief Complaint: Shortness of breath, recent positive COVID-19 test  HPI: Martha Carter is a 58 y.o. 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  Patient had a positive Covid antigen test on 12/12/2018 (as seen in care everywhere) in [facility] emergency department after multiple previous negative tests.  While in the ED she underwent further evaluation with chest x-ray suggestive of mild interstitial edema.  CTA chest PE study was obtained which was negative for PE.  Changes suggestive of pulmonary venous hypertension/CHF with fluid in the left major fissure were noted as well as suspected hepatomegaly and splenomegaly. Patient was given IV dexamethasone 10 mg and albuterol inhaler treatments in the ED.  She was transferred from her usual nursing facility to [facility] home for Covid care 2 days ago.  She says she has not been able to get her usual medications since she has been at the new nursing facility.  She has had progressive shortness of breath associated with cough productive of clear sputum.  She has noticed increased swelling of her left lower extremity.  She has been having some central abdominal pain and dysuria.  She denies any subjective fevers, chills, chest pain.  She says normally she wears between 3-5 L supplemental O2 at home but when she is receiving her  medications is usually able to stay on 3 L.    She also has chronic anemia and denies any obvious bleeding including epistaxis, hemoptysis, hematemesis, hematuria, hematochezia, or melena.  She has been seen by hematology with plans to start EPO soon.  ED Course:  Initial vitals showed BP 152/71, pulse 95, RR 27, temp 98.0 Fahrenheit, SPO2 100% on 5 L supplemental O2 via [place].  Labs are notable for WBC 5.1, hemoglobin 7.1 (7.5 on 12/12/2018, baseline appears to be 8.5-9.5), platelets 130,000, sodium 140, potassium 3.8, bicarb 23, BUN 58, creatinine 1.65, AST 15, ALT 20, alk phos 110, total bilirubin 1.1, BNP 370.  Portable chest x-ray shows hazy airspace opacities in the mid left lung and right mid to lower lung.  Patient was given IV Lasix 60 mg once and the hospitalist service was consulted to admit for further evaluation and management.  Review of Systems: All systems reviewed and are negative except as documented in history of present illness above.   Past Medical History:  Diagnosis Date  . Acute on chronic respiratory failure with hypoxia ([place])   . Chronic anemia   . Chronic atrial fibrillation ([place])   . Chronic pain syndrome   . Coronary artery disease   . 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 GI bleed   . Lobar pneumonia, unspecified organism ([place])   . Peripheral arterial disease [facility])     Past Surgical History:  Procedure Laterality Date  . ANGIOPLASTY    . CHOLECYSTECTOMY    . COLONOSCOPY    . CORONARY ARTERY BYPASS GRAFT    .  HYSTERECTOMY ABDOMINAL WITH SALPINGECTOMY    . IR GASTROSTOMY TUBE MOD SED  07/28/2017  . KNEE SURGERY    . TONSILLECTOMY    . TRACHEOSTOMY TUBE PLACEMENT N/A 07/25/2017   Procedure: TRACHEOSTOMY;  Surgeon: Rozetta Nunnery, MD;  Location: [facility];  Service: ENT;  Laterality: N/A;    Social History:  reports that she has quit smoking. She has never used smokeless tobacco. She reports previous alcohol use.  She reports previous drug use.  Allergies  Allergen Reactions  . Exenatide Itching and Swelling  . Sulfamethoxazole Other (See Comments)    Doesn't know reaction - child       . Ace Inhibitors Cough  . Ceftriaxone Nausea And Vomiting  . Clindamycin Nausea Only  . Doxycycline Nausea Only  . Hydromorphone Nausea And Vomiting  . Latex Itching  . Metoclopramide Nausea And Vomiting  . Morphine Nausea And Vomiting  . Tape Itching    Blisters Adhesive tape; patient prefers paper tape      Family History  Family history unknown: Yes     Prior to Admission medications   Medication Sig Start Date End Date Taking? Authorizing Provider  acetaminophen (TYLENOL) 325 MG tablet Place 650 mg into feeding tube every 6 (six) hours as needed.    [provider]  acetaminophen (TYLENOL) 650 MG suppository Place 650 mg rectally every 6 (six) hours as needed for mild pain, moderate pain or fever.    [provider]  Amino Acids-Protein Hydrolys (FEEDING SUPPLEMENT, PRO-STAT SUGAR FREE 64,) LIQD Take 30 mLs by mouth 2 (two) times daily.    [provider]  amLODipine (NORVASC) 5 MG tablet Place 5 mg into feeding tube daily.    [provider]  antiseptic oral rinse (BIOTENE) LIQD 15 mLs by Mouth Rinse route 3 (three) times daily.    [provider]  aspirin 81 MG chewable tablet Place 81 mg into feeding tube daily.    [provider]  atorvastatin (LIPITOR) 10 MG tablet Place 10 mg into feeding tube at bedtime.    [provider]  carvedilol (COREG) 12.5 MG tablet Place 12.5 mg into feeding tube 2 (two) times daily.    [provider]  citalopram (CELEXA) 10 MG tablet Place 20 mg into feeding tube daily.    [provider]  clonazePAM (KLONOPIN) 0.5 MG tablet Place 0.5 mg into feeding tube 3 (three) times daily.    [provider]  Dextrose-Sodium Chloride (DEXTROSE 5 % AND 0.45% NACL) infusion Inject 1,000 mLs  into the vein as needed.    [provider]  gabapentin (NEURONTIN) 300 MG capsule Place 300 mg into feeding tube at bedtime.    [provider]  guaiFENesin (ROBITUSSIN) 100 MG/5ML SOLN Place 10 mLs into feeding tube every 6 (six) hours as needed for cough or to loosen phlegm.    [provider]  haloperidol (HALDOL) 1 MG tablet Place 1 mg into feeding tube every 6 (six) hours as needed (anxiety).    [provider]  insulin glargine (LANTUS) 100 UNIT/ML injection Inject 30 Units into the skin daily. Hold if tube feed stopped, NPO, or if BS is less than 70    [provider]  insulin lispro (HUMALOG) 100 UNIT/ML injection Inject 4-16 Units into the skin every 6 (six) hours. Per sliding scale  70-150 give 0 units 151-200 give 4 units 201-250 give 8 units  251-300 give 10 units 301-350 give 12 units 351-400 give 16  units >400 give 16 units, call MD and obtain stat lab verification.    [provider]  magnesium oxide (MAG-OX) 400 MG tablet Place 400 mg into feeding tube every 6 (six) hours as needed.    [provider]  magnesium sulfate 1-5 GM/100ML-% INFUSION Inject 1 g into the vein as needed.    [provider]  metoprolol tartrate (LOPRESSOR) 5 MG/5ML SOLN injection Inject 5 mg into the vein every 6 (six) hours as needed (HR >115).    [provider]  Multiple Vitamin (MULTIVITAMIN WITH MINERALS) TABS tablet Place 1 tablet into feeding tube daily.    [provider]  nitroGLYCERIN (NITROGLYN) 2 % ointment Apply 0.5 inches topically every 6 (six) hours. Off from 12 midnight to 6am for nitrate break    [provider]  ondansetron (ZOFRAN) 4 MG tablet Place 4 mg into feeding tube every 6 (six) hours as needed for nausea or vomiting.    [provider]  ondansetron (ZOFRAN) 4 MG/2ML SOLN injection Inject 4 mg into the vein every 6 (six) hours as needed for nausea or vomiting.    [provider]  pantoprazole sodium (PROTONIX) 40 mg/20 mL PACK Place 40 mg into feeding tube 2 (two) times daily.    [provider]  potassium chloride 10 MEQ/50ML Inject 10 mEq into the vein as needed.    [provider]  potassium chloride SA (K-DUR,KLOR-CON) 20 MEQ tablet Take 20 mEq by mouth every 4 (four) hours as needed.    [provider]  QUEtiapine (SEROQUEL) 200 MG tablet Place 200 mg into feeding tube 2 (two) times daily.    [provider]  sodium polystyrene (KAYEXALATE) 15 GM/60ML suspension Take 30 g by mouth as needed.    [provider]    Physical Exam: Vitals:   12/15/18 2130 12/15/18 2200 12/15/18 2330 12/16/18 0000  BP:   (!) 167/73 (!) 173/80  Pulse: 92 95 90 94  Resp: 19 (!) 27 (!) 24 20  Temp:      TempSrc:      SpO2: 100% 100% 100% 100%    Constitutional: Chronically ill-appearing obese woman resting in bed with head slightly elevated, NAD, calm Eyes: PERRL, lids and conjunctivae normal ENMT: Mucous membranes are moist. Posterior pharynx clear of any exudate or lesions.Normal dentition.  Neck: normal, supple, no masses. Respiratory: Bibasilar inspiratory crackles without wheezing. Normal respiratory effort. No accessory muscle use.  Cardiovascular: Regular rate and rhythm, no murmurs / rubs / gallops.  +2 edema LLE. Abdomen: Mild lower abdominal tenderness, no masses palpated. Bowel sounds positive.  Musculoskeletal: S/p right BKA.  No clubbing / cyanosis. No joint deformity upper and lower extremities. Good ROM, no contractures. Normal muscle tone.  Skin: no rashes, lesions, ulcers. No induration Neurologic: CN 2-12 grossly intact. Sensation intact, Strength intact bilateral upper and left lower extremities. Psychiatric: Normal judgment and insight. Alert and oriented x 3. Normal mood.   Labs on Admission: I have personally reviewed following labs and imaging studies  CBC: Recent Labs  Lab 12/15/18 2037  WBC  5.1  NEUTROABS 4.2  HGB 7.1*  HCT 22.0*  MCV 92.8  PLT 233*   Basic Metabolic Panel: Recent Labs  Lab 12/15/18 2037  NA 140  K 3.8  CL 106  CO2 23  GLUCOSE 262*  BUN 58*  CREATININE 1.65*  CALCIUM 9.4   GFR: CrCl cannot be calculated (Unknown ideal weight.). Liver Function Tests: Recent Labs  Lab  12/15/18 2037  AST 15  ALT 20  ALKPHOS 110  BILITOT 1.1  PROT 6.3*  ALBUMIN 3.1*   No results for input(s): LIPASE, AMYLASE in the last 168 hours. No results for input(s): AMMONIA in the last 168 hours. Coagulation Profile: No results for input(s): INR, PROTIME in the last 168 hours. Cardiac Enzymes: No results for input(s): CKTOTAL, CKMB, CKMBINDEX, TROPONINI in the last 168 hours. BNP (last 3 results) No results for input(s): PROBNP in the last 8760 hours. HbA1C: No results for input(s): HGBA1C in the last 72 hours. CBG: No results for input(s): GLUCAP in the last 168 hours. Lipid Profile: No results for input(s): CHOL, HDL, LDLCALC, TRIG, CHOLHDL, LDLDIRECT in the last 72 hours. Thyroid Function Tests: No results for input(s): TSH, T4TOTAL, FREET4, T3FREE, THYROIDAB in the last 72 hours. Anemia Panel: No results for input(s): VITAMINB12, FOLATE, FERRITIN, TIBC, IRON, RETICCTPCT in the last 72 hours. Urine analysis:    Component Value Date/Time   COLORURINE YELLOW 07/22/2017 0130   APPEARANCEUR CLEAR 07/22/2017 0130   LABSPEC 1.021 07/22/2017 0130   PHURINE 6.0 07/22/2017 0130   GLUCOSEU NEGATIVE 07/22/2017 0130   HGBUR NEGATIVE 07/22/2017 0130   BILIRUBINUR NEGATIVE 07/22/2017 0130   KETONESUR NEGATIVE 07/22/2017 0130   PROTEINUR 100 (A) 07/22/2017 0130   NITRITE NEGATIVE 07/22/2017 0130   LEUKOCYTESUR NEGATIVE 07/22/2017 0130    Radiological Exams on Admission: DG Chest Port 1 View  Result Date: 12/15/2018 CLINICAL DATA:  COVID positive.  No complaints. EXAM: PORTABLE CHEST 1 VIEW COMPARISON:  08/09/2017. FINDINGS: Hazy opacity noted in the left mid  lung. More subtle hazy opacity suggested in the right mid to lower lung. There are prominent bronchovascular markings bilaterally. No convincing pleural effusion.  Pneumothorax. Stable changes prior cardiac surgery. Cardiac silhouette enlarged. No mediastinal or hilar masses. IMPRESSION: 1. Subtle hazy airspace opacity in mid to lower lung and possibly in the right mid to lower lung as well. Study limited by the semi-erect AP portable technique. Findings consistent multifocal infection in the clinical setting. Electronically Signed   By: Lajean Manes M.D.   On: 12/15/2018 20:39    EKG: Independently reviewed. Sinus rhythm, late R wave transition, wandering leads, no acute ischemic changes.  No prior for comparison.  Assessment/Plan Principal Problem:   Acute respiratory disease due to COVID-19 virus Active Problems:   Coronary artery disease   AF (paroxysmal atrial fibrillation) (HCC)   Peripheral arterial disease (HCC)   Anemia of chronic disease   COPD (chronic obstructive pulmonary disease) (HCC)   Chronic respiratory failure with hypoxia (HCC)   Acute on chronic heart failure with preserved ejection fraction (HFpEF) ([place])   Depression with anxiety   Insulin dependent type 2 diabetes mellitus ([place])   Hypertension associated with diabetes (HCC)  Martha Carter is a 58 y.o. female with medical history significant for HFpEF (EF 55-65%, G1 DD by echo 09/01/2018), COPD with chronic respiratory failure on 3-5 L O2, CAD s/p CABG, paroxysmal atrial fibrillation not on anticoagulation, CKD stage II, anemia of chronic disease, IDT2DM, PAD s/p right BKA, hypertension, and depression with anxiety who is admitted with acute on chronic respiratory failure due to heart failure and COVID-19 viral infection.  Acute respiratory disease due to COVID-19 viral infection: With patchy hazy opacities on x-ray and increased home O2 requirement.  Covid antigen positive on 12/12/2018 seen in care  everywhere. -Start IV remdesivir -Start IV Decadron 6 mg daily -Continue supplemental oxygen -Continue incentive spirometer, flutter valve, Combivent -Obtain  baseline inflammatory markers and follow daily labs -Continue antitussives, vitamin C, zinc  Acute on chronic heart failure with preserved ejection fraction: EF 55-65%, G1 DD by echo 09/01/2018.  Volume overloaded at time of admission.  Appears to be on Bumex 2 mg daily and Lasix 20 mg daily as an outpatient. -Continue IV Lasix 40 mg twice daily -Monitor strict I/O's, daily weights -Supplement potassium  Acute kidney injury on CKD stage II: Suspect due to volume overload.  Continue diuresis as above and follow labs.  COPD with chronic respiratory failure: Chronically on 3-5 L O2 at home, requiring 5 L at time of admission.  Currently does not have any wheezing. -Continue supplemental oxygen -Continue Combivent and Dulera  Anemia of chronic disease: Hemoglobin appears stable relative to recent labs.  No obvious bleeding.  Seen by hematology/oncology last week with plans to start Procrit soon.  Continue monitor, transfuse as needed.  Dysuria: Check urinalysis with reflex microscopy.  Treat as indicated.  Paroxysmal atrial fibrillation: In sinus rhythm on admission.  Per review of records, unclear whether she has had definitive diagnosis of atrial fibrillation this or does not appear to be on anticoagulation.  Continue monitor.  Insulin-dependent type 2 diabetes: Continue reduced home Lantus plus SSI.  Adjust as needed.  Check A1c.  CAD s/p CABG: Chronic and stable without acute chest pain.  -Continue Coreg, Imdur, Lipitor  PAD s/p right-sided BKA: Chronic and stable.  Continue gabapentin for neuropathy and as needed Atarax for phantom itching.  Hypertension: Continue Coreg, hydralazine, Imdur, and Lasix as above.  Depression with anxiety: Continue sertraline and as needed Atarax.   DVT prophylaxis: Subcutaneous  heparin Code Status: Full code, confirmed with patient Family Communication: Discussed with patient, she will speak with her family in the morning Disposition Plan: Pending clinical progress Consults called: None Admission status: Inpatient for management of acute on chronic heart failure with preserved ejection fraction and COVID-19 viral infection, likely requires greater than 2 midnight length stay as she is high risk for decompensation given her multiple chronic comorbidities as listed above.   Zada Finders MD [facility]  If 7PM-7AM, please contact night-coverage [email or web address]  12/16/2018, 12:43 AM

## 2018-12-15 NOTE — ED Provider Notes (Signed)
[facility]-EMERGENCY DEPT Provider Note   CSN: [id number] Arrival date & time: 12/15/18  1819     History Chief Complaint  Patient presents with  . Shortness of Breath    Martha Carter is a [age] y.o. female.  HPI    Patient with a history of atrial fibrillation, CHF on 3 to 5 L of supplemental oxygen was recently diagnosed with COVID-19.  She was admitted to a new nursing facility on Wednesday.  She states she has not received her medications since being at the facility.  She called EMS because she was concerned that her shortness of breath was worsening because she has not had her medications.  Patient with minimal cough.  She has had increased swelling to her extremities.  No fever or chills.  Denies chest pain. Past Medical History:  Diagnosis Date  . Acute on chronic respiratory failure with hypoxia (HCC)   . Chronic anemia   . Chronic atrial fibrillation (HCC)   . Chronic pain syndrome   . Coronary artery disease   . 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 GI bleed   . Lobar pneumonia, unspecified organism (HCC)   . Peripheral arterial disease [facility](HCC)     Patient Active Problem List   Diagnosis Date Noted  . Acute on chronic respiratory failure with hypoxia (HCC)   . Lobar pneumonia, unspecified organism (HCC)   . Coronary artery disease   . Chronic atrial fibrillation [facility](HCC)     Past Surgical History:  Procedure Laterality Date  . ANGIOPLASTY    . CHOLECYSTECTOMY    . COLONOSCOPY    . CORONARY ARTERY BYPASS GRAFT    . HYSTERECTOMY ABDOMINAL WITH SALPINGECTOMY    . IR GASTROSTOMY TUBE MOD SED  07/28/2017  . KNEE SURGERY    . TONSILLECTOMY    . TRACHEOSTOMY TUBE PLACEMENT N/A 07/25/2017   Procedure: TRACHEOSTOMY;  Surgeon: Drema HalonNewman, Christopher E, MD;  [facility];  Service: ENT;  Laterality: N/A;     OB History   No obstetric history on file.     Family History  Family history unknown: Yes     Social History   Tobacco Use  . Smoking status: Former Games developermoker  . Smokeless tobacco: Never Used  Substance Use Topics  . Alcohol use: Not Currently  . Drug use: Not Currently    Home Medications Prior to Admission medications   Medication Sig Start Date End Date Taking? Authorizing Provider  acetaminophen (TYLENOL) 325 MG tablet Place 650 mg into feeding tube every 6 (six) hours as needed.    [provider]  acetaminophen (TYLENOL) 650 MG suppository Place 650 mg rectally every 6 (six) hours as needed for mild pain, moderate pain or fever.    [provider]  Amino Acids-Protein Hydrolys (FEEDING SUPPLEMENT, PRO-STAT SUGAR FREE 64,) LIQD Take 30 mLs by mouth 2 (two) times daily.    [provider]  amLODipine (NORVASC) 5 MG tablet Place 5 mg into feeding tube daily.    [provider]  antiseptic oral rinse (BIOTENE) LIQD 15 mLs by Mouth Rinse route 3 (three) times daily.    [provider]  aspirin 81 MG chewable tablet Place 81 mg into feeding tube daily.    [provider]  atorvastatin (LIPITOR) 10 MG tablet Place 10 mg into feeding tube at bedtime.    [provider]  carvedilol (COREG) 12.5 MG tablet Place 12.5 mg into feeding tube  2 (two) times daily.    [provider]  citalopram (CELEXA) 10 MG tablet Place 20 mg into feeding tube daily.    [provider]  clonazePAM (KLONOPIN) 0.5 MG tablet Place 0.5 mg into feeding tube 3 (three) times daily.    [provider]  Dextrose-Sodium Chloride (DEXTROSE 5 % AND 0.45% NACL) infusion Inject 1,000 mLs into the vein as needed.    [provider]  gabapentin (NEURONTIN) 300 MG capsule Place 300 mg into feeding tube at bedtime.    [provider]  guaiFENesin (ROBITUSSIN) 100 MG/5ML SOLN Place 10 mLs into feeding tube every 6 (six) hours as needed for cough or to loosen phlegm.    [provider]  haloperidol (HALDOL) 1  MG tablet Place 1 mg into feeding tube every 6 (six) hours as needed (anxiety).    [provider]  insulin glargine (LANTUS) 100 UNIT/ML injection Inject 30 Units into the skin daily. Hold if tube feed stopped, NPO, or if BS is less than 70    [provider]  insulin lispro (HUMALOG) 100 UNIT/ML injection Inject 4-16 Units into the skin every 6 (six) hours. Per sliding scale  70-150 give 0 units 151-200 give 4 units 201-250 give 8 units  251-300 give 10 units 301-350 give 12 units 351-400 give 16 units >400 give 16 units, call MD and obtain stat lab verification.    [provider]  magnesium oxide (MAG-OX) 400 MG tablet Place 400 mg into feeding tube every 6 (six) hours as needed.    [provider]  magnesium sulfate 1-5 GM/100ML-% INFUSION Inject 1 g into the vein as needed.    [provider]  metoprolol tartrate (LOPRESSOR) 5 MG/5ML SOLN injection Inject 5 mg into the vein every 6 (six) hours as needed (HR >115).    [provider]  Multiple Vitamin (MULTIVITAMIN WITH MINERALS) TABS tablet Place 1 tablet into feeding tube daily.    [provider]  nitroGLYCERIN (NITROGLYN) 2 % ointment Apply 0.5 inches topically every 6 (six) hours. Off from 12 midnight to 6am for nitrate break    [provider]  ondansetron (ZOFRAN) 4 MG tablet Place 4 mg into feeding tube every 6 (six) hours as needed for nausea or vomiting.    [provider]  ondansetron (ZOFRAN) 4 MG/2ML SOLN injection Inject 4 mg into the vein every 6 (six) hours as needed for nausea or vomiting.    [provider]  pantoprazole sodium (PROTONIX) 40 mg/20 mL PACK Place 40 mg into feeding tube 2 (two) times daily.    [provider]  potassium chloride 10 MEQ/50ML Inject 10 mEq into the vein as needed.    [provider]  potassium chloride SA (K-DUR,KLOR-CON) 20 MEQ tablet Take 20 mEq by mouth every 4 (four) hours as needed.     [provider]  QUEtiapine (SEROQUEL) 200 MG tablet Place 200 mg into feeding tube 2 (two) times daily.    [provider]  sodium polystyrene (KAYEXALATE) 15 GM/60ML suspension Take 30 g by mouth as needed.    [provider]    Allergies    Exenatide, Sulfamethoxazole, Ace inhibitors, Ceftriaxone, Clindamycin, Doxycycline, Hydromorphone, Latex, Metoclopramide, Morphine, and Tape  Review of Systems   Review of Systems  Constitutional: Negative for chills and fever.  HENT: Negative for sore throat and trouble swallowing.   Eyes: Negative for visual disturbance.  Respiratory: Positive for cough and shortness of breath.  Cardiovascular: Positive for leg swelling. Negative for chest pain.  Gastrointestinal: Negative for abdominal pain, constipation, diarrhea, nausea and vomiting.  Genitourinary: Negative for dysuria and flank pain.  Musculoskeletal: Negative for arthralgias, back pain, myalgias and neck pain.  Skin: Negative for rash and wound.  Neurological: Negative for dizziness, weakness, light-headedness, numbness and headaches.  All other systems reviewed and are negative.   Physical Exam Updated Vital Signs BP (!) 152/71   Pulse 95   Temp 98 F (36.7 C) (Oral)   Resp (!) 27   SpO2 100%   Physical Exam Vitals and nursing note reviewed.  Constitutional:      General: She is not in acute distress.    Appearance: Normal appearance. She is well-developed. She is not ill-appearing.  HENT:     Head: Normocephalic and atraumatic.     Nose: Nose normal.     Mouth/Throat:     Mouth: Mucous membranes are moist.  Eyes:     Pupils: Pupils are equal, round, and reactive to light.  Cardiovascular:     Rate and Rhythm: Normal rate and regular rhythm.     Heart sounds: No murmur. No friction rub. No gallop.   Pulmonary:     Effort: Pulmonary effort is normal.     Breath sounds: Rales present.     Comments: Few crackles in bilateral bases. Abdominal:      General: Bowel sounds are normal. There is distension.     Palpations: Abdomen is soft.     Tenderness: There is no abdominal tenderness. There is no guarding or rebound.  Musculoskeletal:        General: Swelling present. No tenderness, deformity or signs of injury. Normal range of motion.     Cervical back: Normal range of motion and neck supple. No rigidity or tenderness.     Left lower leg: Edema present.     Comments: Right above-the-knee amputation.  Left lower extremity with 2+ pitting edema.  Distal pulses intact  Lymphadenopathy:     Cervical: No cervical adenopathy.  Skin:    General: Skin is warm and dry.     Findings: No erythema or rash.  Neurological:     General: No focal deficit present.     Mental Status: She is alert and oriented to person, place, and time.  Psychiatric:        Behavior: Behavior normal.     ED Results / Procedures / Treatments   Labs (all labs ordered are listed, but only abnormal results are displayed) Labs Reviewed  CBC WITH DIFFERENTIAL/PLATELET  COMPREHENSIVE METABOLIC PANEL  BRAIN NATRIURETIC PEPTIDE    EKG EKG Interpretation  Date/Time:  Saturday December 15 2018 18:59:22 EST Ventricular Rate:  96 PR Interval:    QRS Duration: 100 QT Interval:  358 QTC Calculation: 453 R Axis:   -44 Text Interpretation: Sinus rhythm Abnormal R-wave progression, late transition LVH with secondary repolarization abnormality Baseline wander in lead(s) V3 Confirmed by Loren Racer ([id number]) on 12/15/2018 8:18:08 PM   Radiology No results found.  Procedures Procedures (including critical care time)  Medications Ordered in ED Medications - No data to display  ED Course  I have reviewed the triage vital signs and the nursing notes.  Pertinent labs & imaging results that were available during my care of the patient were reviewed by me and considered in my medical decision making (see chart for details).    MDM Rules/Calculators/A&P      CHA2DS2/VAS Stroke Risk Points  Current  as of 12 minutes ago     5 >= 2 Points: High Risk  1 - 1.99 Points: Medium Risk  0 Points: Low Risk    This is the only CHA2DS2/VAS Stroke Risk Points available for the past  year.: Last Change: N/A     Details    This score determines the patient's risk of having a stroke if the  patient has atrial fibrillation.       Points Metrics  1 Has Congestive Heart Failure:  Yes     Current as of 12 minutes ago  1 Has Vascular Disease:  Yes    Current as of 12 minutes ago  1 Has Hypertension:  Yes     Current as of 12 minutes ago  0 Age:  40    Current as of 12 minutes ago  1 Has Diabetes:  Yes     Current as of 12 minutes ago  0 Had Stroke:  No  Had TIA:  No  Had thromboembolism:  No    Current as of 12 minutes ago  1 Female:  Yes    Current as of 12 minutes ago                         Patient with recent Covid diagnosis.  Has had increased swelling and shortness of breath after not being on her medication for the past several days.  Suspect there is some degree of fluid overload.  We will give IV Lasix.  Signed out to oncoming provider pending completion of work-up.  Suspect patient will likely need admission. Final Clinical Impression(s) / ED Diagnoses Final diagnoses:  None    Rx / DC Orders ED Discharge Orders    None       Julianne Rice, MD 12/15/18 2126

## 2018-12-15 NOTE — ED Triage Notes (Signed)
Per EMS, patient from [facility], Covid+ with no complaints, states she does not want to stay at facility. Hx CHF. 5L [place]. Right BKA.

## 2018-12-15 NOTE — ED Notes (Signed)
Patient states she is normally on 4-5L of oxygen

## 2018-12-15 NOTE — ED Notes (Addendum)
Pt was cleaned, changed into a hospital gown, barrier cream applied to sores, and purewick placed with assistance of RN Sonya.

## 2018-12-15 NOTE — ED Provider Notes (Signed)
COVID+, on 3-5 L O2 chronically for CHF Admit to nursing home for [facility]) - per patient has had no medications since she got there She expresses to me she was afraid for her health while there Here appearing fluid overloaded, left LE edema (Right BKA)  Plan: admit after labs resulted  Labs reviewed: Mild AKI Cr 1.65, BUN 58 Hgb 7.1 - appears baseline CXR c/w COVID infection  Will call for admission per plan of previous treatment team.   10:50 - discussed with Dr. Posey Pronto who accepts the patient for admission.   Charlann Lange, PA-C 12/15/18 2256    Julianne Rice, MD 12/16/18 Dorthula Perfect

## 2018-12-15 NOTE — ED Notes (Signed)
While obtaining initial v/s pt advised she needs to be changed. Advised I would grab the supplies and some assistance and be back.

## 2018-12-16 DIAGNOSIS — E1159 Type 2 diabetes mellitus with other circulatory complications: Secondary | ICD-10-CM

## 2018-12-16 DIAGNOSIS — I10 Essential (primary) hypertension: Secondary | ICD-10-CM

## 2018-12-16 DIAGNOSIS — J441 Chronic obstructive pulmonary disease with (acute) exacerbation: Secondary | ICD-10-CM

## 2018-12-16 DIAGNOSIS — J069 Acute upper respiratory infection, unspecified: Secondary | ICD-10-CM

## 2018-12-16 DIAGNOSIS — R161 Splenomegaly, not elsewhere classified: Secondary | ICD-10-CM

## 2018-12-16 DIAGNOSIS — F418 Other specified anxiety disorders: Secondary | ICD-10-CM

## 2018-12-16 DIAGNOSIS — I5033 Acute on chronic diastolic (congestive) heart failure: Secondary | ICD-10-CM

## 2018-12-16 DIAGNOSIS — R319 Hematuria, unspecified: Secondary | ICD-10-CM

## 2018-12-16 DIAGNOSIS — R160 Hepatomegaly, not elsewhere classified: Secondary | ICD-10-CM

## 2018-12-16 DIAGNOSIS — Z794 Long term (current) use of insulin: Secondary | ICD-10-CM

## 2018-12-16 DIAGNOSIS — D638 Anemia in other chronic diseases classified elsewhere: Secondary | ICD-10-CM

## 2018-12-16 DIAGNOSIS — N390 Urinary tract infection, site not specified: Secondary | ICD-10-CM

## 2018-12-16 DIAGNOSIS — E119 Type 2 diabetes mellitus without complications: Secondary | ICD-10-CM

## 2018-12-16 LAB — COMPREHENSIVE METABOLIC PANEL
ALT: 20 U/L (ref 0–44)
AST: 14 U/L — ABNORMAL LOW (ref 15–41)
Albumin: 2.8 g/dL — ABNORMAL LOW (ref 3.5–5.0)
Alkaline Phosphatase: 99 U/L (ref 38–126)
Anion gap: 10 (ref 5–15)
BUN: 54 mg/dL — ABNORMAL HIGH (ref 6–20)
CO2: 24 mmol/L (ref 22–32)
Calcium: 9.2 mg/dL (ref 8.9–10.3)
Chloride: 105 mmol/L (ref 98–111)
Creatinine, Ser: 1.57 mg/dL — ABNORMAL HIGH (ref 0.44–1.00)
GFR calc Af Amer: 41 mL/min — ABNORMAL LOW (ref >60)
GFR calc non Af Amer: 36 mL/min — ABNORMAL LOW (ref >60)
Glucose, Bld: 229 mg/dL — ABNORMAL HIGH (ref 70–99)
Potassium: 3.7 mmol/L (ref 3.5–5.1)
Sodium: 139 mmol/L (ref 135–145)
Total Bilirubin: 0.9 mg/dL (ref 0.3–1.2)
Total Protein: 5.9 g/dL — ABNORMAL LOW (ref 6.5–8.1)

## 2018-12-16 LAB — URINALYSIS, ROUTINE W REFLEX MICROSCOPIC
Bilirubin Urine: NEGATIVE
Glucose, UA: 50 mg/dL — AB
Hgb urine dipstick: NEGATIVE
Ketones, ur: NEGATIVE mg/dL
Nitrite: NEGATIVE
Protein, ur: 100 mg/dL — AB
Specific Gravity, Urine: 1.011 (ref 1.005–1.030)
pH: 6 (ref 5.0–8.0)

## 2018-12-16 LAB — CBC WITH DIFFERENTIAL/PLATELET
Abs Immature Granulocytes: 0.03 10*3/uL (ref 0.00–0.07)
Basophils Absolute: 0 10*3/uL (ref 0.0–0.1)
Basophils Relative: 0 %
Eosinophils Absolute: 0 10*3/uL (ref 0.0–0.5)
Eosinophils Relative: 1 %
HCT: 20.9 % — ABNORMAL LOW (ref 36.0–46.0)
Hemoglobin: 6.5 g/dL — CL (ref 12.0–15.0)
Immature Granulocytes: 1 %
Lymphocytes Relative: 15 %
Lymphs Abs: 0.6 10*3/uL — ABNORMAL LOW (ref 0.7–4.0)
MCH: 29.1 pg (ref 26.0–34.0)
MCHC: 31.1 g/dL (ref 30.0–36.0)
MCV: 93.7 fL (ref 80.0–100.0)
Monocytes Absolute: 0.3 10*3/uL (ref 0.1–1.0)
Monocytes Relative: 7 %
Neutro Abs: 3.3 10*3/uL (ref 1.7–7.7)
Neutrophils Relative %: 76 %
Platelets: 115 10*3/uL — ABNORMAL LOW (ref 150–400)
RBC: 2.23 MIL/uL — ABNORMAL LOW (ref 3.87–5.11)
RDW: 18 % — ABNORMAL HIGH (ref 11.5–15.5)
WBC: 4.2 10*3/uL (ref 4.0–10.5)
nRBC: 0 % (ref 0.0–0.2)

## 2018-12-16 LAB — C-REACTIVE PROTEIN: CRP: 0.8 mg/dL (ref <1.0)

## 2018-12-16 LAB — HEMOGLOBIN AND HEMATOCRIT, BLOOD
HCT: 26 % — ABNORMAL LOW (ref 36.0–46.0)
Hemoglobin: 8.4 g/dL — ABNORMAL LOW (ref 12.0–15.0)

## 2018-12-16 LAB — HEMOGLOBIN A1C
Hgb A1c MFr Bld: 6.8 % — ABNORMAL HIGH (ref 4.8–5.6)
Mean Plasma Glucose: 148.46 mg/dL

## 2018-12-16 LAB — HIV ANTIBODY (ROUTINE TESTING W REFLEX): HIV Screen 4th Generation wRfx: NONREACTIVE

## 2018-12-16 LAB — FERRITIN: Ferritin: 1544 ng/mL — ABNORMAL HIGH (ref 11–307)

## 2018-12-16 LAB — RETICULOCYTES
Immature Retic Fract: 15.3 % (ref 2.3–15.9)
RBC.: 2.21 MIL/uL — ABNORMAL LOW (ref 3.87–5.11)
Retic Count, Absolute: 104.5 10*3/uL (ref 19.0–186.0)
Retic Ct Pct: 4.7 % — ABNORMAL HIGH (ref 0.4–3.1)

## 2018-12-16 LAB — IRON AND TIBC
Iron: 44 ug/dL (ref 28–170)
Saturation Ratios: 32 % — ABNORMAL HIGH (ref 10.4–31.8)
TIBC: 136 ug/dL — ABNORMAL LOW (ref 250–450)
UIBC: 92 ug/dL

## 2018-12-16 LAB — LACTATE DEHYDROGENASE: LDH: 158 U/L (ref 98–192)

## 2018-12-16 LAB — VITAMIN B12: Vitamin B-12: 416 pg/mL (ref 180–914)

## 2018-12-16 LAB — GLUCOSE, CAPILLARY
Glucose-Capillary: 216 mg/dL — ABNORMAL HIGH (ref 70–99)
Glucose-Capillary: 220 mg/dL — ABNORMAL HIGH (ref 70–99)
Glucose-Capillary: 261 mg/dL — ABNORMAL HIGH (ref 70–99)
Glucose-Capillary: 215 mg/dL — ABNORMAL HIGH (ref 70–99)
Glucose-Capillary: 192 mg/dL — ABNORMAL HIGH (ref 70–99)

## 2018-12-16 LAB — D-DIMER, QUANTITATIVE: D-Dimer, Quant: 1.65 ug/mL-FEU — ABNORMAL HIGH (ref 0.00–0.50)

## 2018-12-16 LAB — PROCALCITONIN: Procalcitonin: <0.1 ng/mL

## 2018-12-16 LAB — PREPARE RBC (CROSSMATCH)

## 2018-12-16 LAB — ABO/RH: ABO/RH(D): O NEG

## 2018-12-16 LAB — FIBRINOGEN: Fibrinogen: 566 mg/dL — ABNORMAL HIGH (ref 210–475)

## 2018-12-16 LAB — FOLATE: Folate: 39.2 ng/mL (ref >5.9)

## 2018-12-16 LAB — MAGNESIUM: Magnesium: 1.9 mg/dL (ref 1.7–2.4)

## 2018-12-16 LAB — PHOSPHORUS: Phosphorus: 3.5 mg/dL (ref 2.5–4.6)

## 2018-12-16 MED ORDER — FOLIC ACID 1 MG PO TABS
1.0000 mg | ORAL_TABLET | Freq: Every day | ORAL | Status: DC
  Administered 2018-12-17 – 2018-12-26 (×10): 1 mg via ORAL
  Filled 2018-12-16 (×10): qty 1

## 2018-12-16 MED ORDER — GUAIFENESIN-DM 100-10 MG/5ML PO SYRP
10.0000 mL | ORAL_SOLUTION | ORAL | Status: DC | PRN
  Administered 2018-12-16 – 2018-12-25 (×18): 10 mL via ORAL
  Filled 2018-12-16 (×19): qty 10

## 2018-12-16 MED ORDER — PROMETHAZINE HCL 25 MG/ML IJ SOLN
25.0000 mg | Freq: Three times a day (TID) | INTRAMUSCULAR | Status: DC | PRN
  Administered 2018-12-16: 25 mg via INTRAVENOUS
  Filled 2018-12-16: qty 1

## 2018-12-16 MED ORDER — FUROSEMIDE 10 MG/ML IJ SOLN
40.0000 mg | Freq: Two times a day (BID) | INTRAMUSCULAR | Status: DC
  Administered 2018-12-16 – 2018-12-17 (×2): 40 mg via INTRAVENOUS
  Filled 2018-12-16 (×2): qty 4

## 2018-12-16 MED ORDER — VITAMIN C 500 MG PO TABS
500.0000 mg | ORAL_TABLET | Freq: Every day | ORAL | Status: DC
  Administered 2018-12-16 – 2018-12-26 (×11): 500 mg via ORAL
  Filled 2018-12-16 (×9): qty 1

## 2018-12-16 MED ORDER — INSULIN ASPART 100 UNIT/ML ~~LOC~~ SOLN
0.0000 [IU] | Freq: Every day | SUBCUTANEOUS | Status: DC
  Administered 2018-12-16: 2 [IU] via SUBCUTANEOUS

## 2018-12-16 MED ORDER — SERTRALINE HCL 50 MG PO TABS
100.0000 mg | ORAL_TABLET | Freq: Every day | ORAL | Status: DC
  Administered 2018-12-16 – 2018-12-26 (×11): 100 mg via ORAL
  Filled 2018-12-16 (×11): qty 2

## 2018-12-16 MED ORDER — SODIUM CHLORIDE 0.9 % IV SOLN
200.0000 mg | Freq: Once | INTRAVENOUS | Status: AC
  Administered 2018-12-16: 200 mg via INTRAVENOUS
  Filled 2018-12-16: qty 200

## 2018-12-16 MED ORDER — IPRATROPIUM-ALBUTEROL 20-100 MCG/ACT IN AERS
1.0000 | INHALATION_SPRAY | Freq: Four times a day (QID) | RESPIRATORY_TRACT | Status: DC
  Administered 2018-12-16 (×2): 1 via RESPIRATORY_TRACT
  Filled 2018-12-16: qty 4

## 2018-12-16 MED ORDER — HYDROCOD POLST-CPM POLST ER 10-8 MG/5ML PO SUER
5.0000 mL | Freq: Two times a day (BID) | ORAL | Status: DC | PRN
  Administered 2018-12-16 – 2018-12-26 (×20): 5 mL via ORAL
  Filled 2018-12-16 (×20): qty 5

## 2018-12-16 MED ORDER — APIXABAN 2.5 MG PO TABS
2.5000 mg | ORAL_TABLET | Freq: Two times a day (BID) | ORAL | Status: DC
  Administered 2018-12-16 – 2018-12-26 (×21): 2.5 mg via ORAL
  Filled 2018-12-16 (×21): qty 1

## 2018-12-16 MED ORDER — INSULIN ASPART 100 UNIT/ML ~~LOC~~ SOLN: 0.0000 [IU] | Freq: Every day | SUBCUTANEOUS | Status: DC

## 2018-12-16 MED ORDER — FUROSEMIDE 10 MG/ML IJ SOLN
40.0000 mg | Freq: Two times a day (BID) | INTRAMUSCULAR | Status: DC
  Administered 2018-12-16: 40 mg via INTRAVENOUS
  Filled 2018-12-16: qty 4

## 2018-12-16 MED ORDER — SODIUM CHLORIDE 0.9% IV SOLUTION: Freq: Once | INTRAVENOUS | Status: DC

## 2018-12-16 MED ORDER — HEPARIN SODIUM (PORCINE) 5000 UNIT/ML IJ SOLN
5000.0000 [IU] | Freq: Three times a day (TID) | INTRAMUSCULAR | Status: DC
  Administered 2018-12-16: 5000 [IU] via SUBCUTANEOUS
  Filled 2018-12-16: qty 1

## 2018-12-16 MED ORDER — CLONIDINE HCL 0.1 MG PO TABS
0.1000 mg | ORAL_TABLET | Freq: Two times a day (BID) | ORAL | Status: DC | PRN
  Administered 2018-12-18: 0.1 mg via ORAL
  Filled 2018-12-16: qty 1

## 2018-12-16 MED ORDER — INSULIN GLARGINE 100 UNIT/ML ~~LOC~~ SOLN
40.0000 [IU] | Freq: Every day | SUBCUTANEOUS | Status: DC
  Administered 2018-12-16: 40 [IU] via SUBCUTANEOUS
  Filled 2018-12-16 (×2): qty 0.4

## 2018-12-16 MED ORDER — IPRATROPIUM BROMIDE HFA 17 MCG/ACT IN AERS
2.0000 | INHALATION_SPRAY | RESPIRATORY_TRACT | Status: DC
  Administered 2018-12-16 – 2018-12-19 (×20): 2 via RESPIRATORY_TRACT
  Filled 2018-12-16: qty 12.9

## 2018-12-16 MED ORDER — POLYSACCHARIDE IRON COMPLEX 150 MG PO CAPS
150.0000 mg | ORAL_CAPSULE | Freq: Every day | ORAL | Status: DC
  Administered 2018-12-17 – 2018-12-26 (×10): 150 mg via ORAL
  Filled 2018-12-16 (×10): qty 1

## 2018-12-16 MED ORDER — CARVEDILOL 25 MG PO TABS
25.0000 mg | ORAL_TABLET | Freq: Two times a day (BID) | ORAL | Status: DC
  Administered 2018-12-16: 25 mg via ORAL
  Filled 2018-12-16: qty 1

## 2018-12-16 MED ORDER — ASPIRIN EC 81 MG PO TBEC
81.0000 mg | DELAYED_RELEASE_TABLET | Freq: Every day | ORAL | Status: DC
  Administered 2018-12-16: 81 mg via ORAL
  Filled 2018-12-16: qty 1

## 2018-12-16 MED ORDER — SUCRALFATE 1 G PO TABS
1.0000 g | ORAL_TABLET | Freq: Three times a day (TID) | ORAL | Status: DC
  Administered 2018-12-16 – 2018-12-26 (×18): 1 g via ORAL
  Filled 2018-12-16 (×27): qty 1

## 2018-12-16 MED ORDER — VITAMIN D3 25 MCG (1000 UNIT) PO TABS
2000.0000 [IU] | ORAL_TABLET | Freq: Every day | ORAL | Status: DC
  Administered 2018-12-17 – 2018-12-26 (×10): 2000 [IU] via ORAL
  Filled 2018-12-16 (×10): qty 2

## 2018-12-16 MED ORDER — PANTOPRAZOLE SODIUM 40 MG PO TBEC
40.0000 mg | DELAYED_RELEASE_TABLET | Freq: Every day | ORAL | Status: DC
  Administered 2018-12-16 – 2018-12-26 (×11): 40 mg via ORAL
  Filled 2018-12-16 (×11): qty 1

## 2018-12-16 MED ORDER — SODIUM CHLORIDE 0.9 % IV SOLN
1.0000 g | INTRAVENOUS | Status: DC
  Administered 2018-12-16 – 2018-12-19 (×4): 1 g via INTRAVENOUS
  Filled 2018-12-16: qty 1
  Filled 2018-12-16 (×3): qty 10
  Filled 2018-12-16: qty 1

## 2018-12-16 MED ORDER — ISOSORBIDE MONONITRATE ER 60 MG PO TB24
120.0000 mg | ORAL_TABLET | Freq: Every day | ORAL | Status: DC
  Administered 2018-12-16 – 2018-12-26 (×11): 120 mg via ORAL
  Filled 2018-12-16 (×11): qty 2

## 2018-12-16 MED ORDER — INSULIN ASPART 100 UNIT/ML ~~LOC~~ SOLN
0.0000 [IU] | Freq: Three times a day (TID) | SUBCUTANEOUS | Status: DC
  Administered 2018-12-16: 8 [IU] via SUBCUTANEOUS
  Administered 2018-12-16: 5 [IU] via SUBCUTANEOUS

## 2018-12-16 MED ORDER — ONDANSETRON HCL 4 MG/2ML IJ SOLN
4.0000 mg | Freq: Three times a day (TID) | INTRAMUSCULAR | Status: DC | PRN
  Administered 2018-12-16 (×3): 4 mg via INTRAVENOUS
  Filled 2018-12-16 (×3): qty 2

## 2018-12-16 MED ORDER — ORAL CARE MOUTH RINSE
15.0000 mL | Freq: Two times a day (BID) | OROMUCOSAL | Status: DC
  Administered 2018-12-16 – 2018-12-26 (×20): 15 mL via OROMUCOSAL

## 2018-12-16 MED ORDER — LINAGLIPTIN 5 MG PO TABS
5.0000 mg | ORAL_TABLET | Freq: Every day | ORAL | Status: DC
  Administered 2018-12-16 – 2018-12-26 (×11): 5 mg via ORAL
  Filled 2018-12-16 (×11): qty 1

## 2018-12-16 MED ORDER — GABAPENTIN 300 MG PO CAPS
300.0000 mg | ORAL_CAPSULE | Freq: Three times a day (TID) | ORAL | Status: DC
  Administered 2018-12-16 – 2018-12-26 (×31): 300 mg via ORAL
  Filled 2018-12-16 (×6): qty 1
  Filled 2018-12-16: qty 3
  Filled 2018-12-16 (×13): qty 1
  Filled 2018-12-16: qty 3
  Filled 2018-12-16 (×10): qty 1

## 2018-12-16 MED ORDER — ALBUTEROL SULFATE HFA 108 (90 BASE) MCG/ACT IN AERS
2.0000 | INHALATION_SPRAY | RESPIRATORY_TRACT | Status: DC | PRN
  Filled 2018-12-16: qty 6.7

## 2018-12-16 MED ORDER — HYDROXYZINE HCL 25 MG PO TABS
25.0000 mg | ORAL_TABLET | Freq: Three times a day (TID) | ORAL | Status: DC | PRN
  Administered 2018-12-16 – 2018-12-22 (×5): 25 mg via ORAL
  Filled 2018-12-16 (×6): qty 1

## 2018-12-16 MED ORDER — VITAMIN B-12 1000 MCG PO TABS
1000.0000 ug | ORAL_TABLET | Freq: Every day | ORAL | Status: DC
  Administered 2018-12-17 – 2018-12-26 (×10): 1000 ug via ORAL
  Filled 2018-12-16 (×10): qty 1

## 2018-12-16 MED ORDER — MOMETASONE FURO-FORMOTEROL FUM 200-5 MCG/ACT IN AERO
2.0000 | INHALATION_SPRAY | Freq: Two times a day (BID) | RESPIRATORY_TRACT | Status: DC
  Administered 2018-12-16 – 2018-12-26 (×21): 2 via RESPIRATORY_TRACT
  Filled 2018-12-16: qty 8.8

## 2018-12-16 MED ORDER — ZINC SULFATE 220 (50 ZN) MG PO CAPS
220.0000 mg | ORAL_CAPSULE | Freq: Every day | ORAL | Status: DC
  Administered 2018-12-16 – 2018-12-26 (×11): 220 mg via ORAL
  Filled 2018-12-16 (×11): qty 1

## 2018-12-16 MED ORDER — HYDRALAZINE HCL 25 MG PO TABS
75.0000 mg | ORAL_TABLET | Freq: Three times a day (TID) | ORAL | Status: DC
  Administered 2018-12-16 – 2018-12-26 (×31): 75 mg via ORAL
  Filled 2018-12-16 (×31): qty 3

## 2018-12-16 MED ORDER — SODIUM CHLORIDE 0.9% FLUSH
3.0000 mL | Freq: Two times a day (BID) | INTRAVENOUS | Status: DC
  Administered 2018-12-16 – 2018-12-26 (×21): 3 mL via INTRAVENOUS

## 2018-12-16 MED ORDER — FLUCONAZOLE 150 MG PO TABS
150.0000 mg | ORAL_TABLET | Freq: Once | ORAL | Status: AC
  Administered 2018-12-16: 150 mg via ORAL
  Filled 2018-12-16: qty 1

## 2018-12-16 MED ORDER — ATORVASTATIN CALCIUM 40 MG PO TABS
40.0000 mg | ORAL_TABLET | Freq: Every day | ORAL | Status: DC
  Administered 2018-12-16 – 2018-12-25 (×10): 40 mg via ORAL
  Filled 2018-12-16 (×10): qty 1

## 2018-12-16 MED ORDER — INSULIN ASPART 100 UNIT/ML ~~LOC~~ SOLN
4.0000 [IU] | Freq: Three times a day (TID) | SUBCUTANEOUS | Status: DC
  Administered 2018-12-16 – 2018-12-17 (×3): 4 [IU] via SUBCUTANEOUS

## 2018-12-16 MED ORDER — POTASSIUM CHLORIDE 20 MEQ/15ML (10%) PO SOLN
20.0000 meq | Freq: Every day | ORAL | Status: DC
  Filled 2018-12-16 (×2): qty 15

## 2018-12-16 MED ORDER — INSULIN ASPART 100 UNIT/ML ~~LOC~~ SOLN
0.0000 [IU] | Freq: Three times a day (TID) | SUBCUTANEOUS | Status: DC
  Administered 2018-12-16 – 2018-12-17 (×2): 5 [IU] via SUBCUTANEOUS
  Administered 2018-12-17: 15 [IU] via SUBCUTANEOUS

## 2018-12-16 MED ORDER — MELATONIN 3 MG PO TABS
3.0000 mg | ORAL_TABLET | Freq: Every day | ORAL | Status: DC
  Administered 2018-12-16 – 2018-12-25 (×10): 3 mg via ORAL
  Filled 2018-12-16 (×10): qty 1

## 2018-12-16 MED ORDER — ALUM & MAG HYDROXIDE-SIMETH 200-200-20 MG/5ML PO SUSP: 30.0000 mL | Freq: Three times a day (TID) | ORAL | Status: DC | PRN

## 2018-12-16 MED ORDER — DEXAMETHASONE SODIUM PHOSPHATE 10 MG/ML IJ SOLN
6.0000 mg | Freq: Every day | INTRAMUSCULAR | Status: AC
  Administered 2018-12-16 – 2018-12-24 (×10): 6 mg via INTRAVENOUS
  Filled 2018-12-16 (×10): qty 1

## 2018-12-16 MED ORDER — POLYVINYL ALCOHOL 1.4 % OP SOLN: 2.0000 [drp] | Freq: Four times a day (QID) | OPHTHALMIC | Status: DC | PRN

## 2018-12-16 MED ORDER — SODIUM CHLORIDE 0.9 % IV SOLN
100.0000 mg | Freq: Every day | INTRAVENOUS | Status: AC
  Administered 2018-12-17 – 2018-12-20 (×4): 100 mg via INTRAVENOUS
  Filled 2018-12-16 (×4): qty 100

## 2018-12-16 MED ORDER — INSULIN GLARGINE 100 UNIT/ML ~~LOC~~ SOLN
30.0000 [IU] | Freq: Every day | SUBCUTANEOUS | Status: DC
  Administered 2018-12-16: 30 [IU] via SUBCUTANEOUS
  Filled 2018-12-16 (×2): qty 0.3

## 2018-12-16 MED ORDER — ADULT MULTIVITAMIN W/MINERALS CH
1.0000 | ORAL_TABLET | Freq: Every day | ORAL | Status: DC
  Administered 2018-12-17 – 2018-12-26 (×10): 1
  Filled 2018-12-16 (×10): qty 1

## 2018-12-16 MED ORDER — ARFORMOTEROL TARTRATE 15 MCG/2ML IN NEBU: 15.0000 ug | INHALATION_SOLUTION | Freq: Two times a day (BID) | RESPIRATORY_TRACT | Status: DC

## 2018-12-16 MED ORDER — BISOPROLOL FUMARATE 5 MG PO TABS
5.0000 mg | ORAL_TABLET | Freq: Every day | ORAL | Status: DC
  Administered 2018-12-16 – 2018-12-18 (×3): 5 mg via ORAL
  Filled 2018-12-16 (×3): qty 1

## 2018-12-16 NOTE — Progress Notes (Signed)
PROGRESS NOTE  Martha Carter GXQ:[id number] DOB: October 14, 1960   PCP: System, Pcp Not In  Patient is from: [facility].  Uses wheelchair at baseline.  DOA: 12/15/2018 LOS: 1  Brief Narrative / Interim history: 58 year old female with history of diastolic CHF, COPD and chronic RF on 3 to 5 L, CAD/CABG, paroxysmal A. fib on Eliquis, CKD-2, anemia of chronic disease, IDDM-2, PAD/right BKA, HTN, anxiety and depression are recent diagnosis was COVID-19 on 12/12/2018 presenting with shortness of breath, productive cough and lower extremity edema.  Tested positive for Covid on 12/12/2018 to [facility] ED. CXR suggestive for mild interstitial edema.  CTA negative for PE but concerning for CHF, hepatomegaly and splenomegaly.  Received IV Decadron 10 mg and albuterol inhalers and she was transferred from his usual NF to [facility].   Patient attributes her symptoms to lack of care and not getting her medications at [facility] facility.  In ED, hemodynamically stable.  Saturating 100% on 5 L.  Hgb 7.1 (baseline 8.5-9.5, and 7.5 on 12/9).  Platelet 130 K.  Creatinine 1.65.  Total bili 1.1.  BNP 370.  CXR with hazy airspace opacities in left right midlungs.  Received IV Lasix and admitted for COVID-19 pneumonia, acute on chronic diastolic CHF and COPD, and AKI.  Subjective: No major events overnight of this morning.  Continues to endorse shortness of breath, productive cough with whitish phlegm, nausea, epigastric pain and dysuria.  Denies fever, chills, emesis or chest pain.  Objective: Vitals:   12/16/18 0000 12/16/18 0153 12/16/18 0454 12/16/18 1202  BP: (!) 173/80 (!) 142/81 (!) 139/59 128/66  Pulse: 94 91 85 88  Resp: 20 (!) 22 18 18   Temp:  98.8 F (37.1 C) 98.3 F (36.8 C) 98.4 F (36.9 C)  TempSrc:  Oral Oral Oral  SpO2: 100% 100% 100% 100%  Weight:   84.8 kg     Intake/Output Summary (Last 24 hours) at 12/16/2018 1403 Last data filed at 12/16/2018 1300 Gross per 24 hour  Intake  873 ml  Output -  Net 873 ml   Filed Weights   12/16/18 0454  Weight: 84.8 kg    Examination:  GENERAL: No apparent distress.  Nontoxic. HEENT: MMM.  Vision and hearing grossly intact.  NECK: Supple.  No apparent JVD.  RESP:  No IWOB.  Fair aeration bilaterally. CVS:  RRR. Heart sounds normal.  ABD/GI/GU: Bowel sounds present. Soft.  Epigastric tenderness. MSK/EXT:  No apparent deformity.  Right BKA.  1+ pitting edema in LLE. SKIN: no apparent skin lesion or wound NEURO: Awake, alert and oriented appropriately.  No gross deficit.  PSYCH: Calm. Normal affect.  Procedures:  None  Assessment & Plan: COPD exacerbation due to COVID-19 pneumonia/infection impression with chronic respiratory failure on 3 to 5 L at baseline.  Exhibits cardinal symptoms of COPD exacerbation.  -Tested positive for COVID-19 on 12/12/18 at outside hospital. -Pro-Cal, LDH CRP within normal..  D-dimer and ferritin elevated to 1.65.  Recent Labs    12/16/18 0248  DDIMER 1.65*  FERRITIN 1,544*  LDH 158  CRP 0.8  -Remdesivir and Decadron 12/12>> -Continue inhalers, mucolytic's, antitussive, incentive spirometry, flutter valve, vitamin C and zinc. -Trend inflammatory markers.  Acute on chronic diastolic CHF: Echo in 08/2018 with EF of 55 to 65%, G1 DD.  Presents with dyspnea and cough.  Has 1+ pitting edema on exam.  She denies orthopnea or PND.  BNP elevated could be falsely low given her weight.  On Bumex and Lasix  at facility.  Reportedly has has not been getting her medication at any facility.  I and O incomplete. -Continue IV Lasix 40 mg twice daily -Monitor fluid status, renal function and electrolytes. -Sodium and fluid restriction.  AKI on CKD-3a: Improved.  She is on Bumex and Lasix which could be contributing. -Creatinine 1.1-1.2 (baseline)> 1.65 (admit)> 1.57 -Continue monitoring  Anemia of chronic disease: Anemia panel consistent with this.  CTA chest concerning for hepatomegaly and  splenomegaly concerning for sequestration.  Reportedly has upcoming appointment with hematologist for Procrit. -Hgb 8-9 (baseline)> 7.5 (12/9)> 7.1 (admit)> 6.5 -Closely monitor H&H -Continue p.o. iron and folic acid.  Hepatomegaly and splenomegaly: Due to CHF?  Could be contributing bisoprolol her anemia. -Manage CHF as above -Outpatient follow-up.  Dysuria: urinalysis not convincing.  She thinks she might have yeast infection -Empirically start ceftriaxone -Urine culture -Diflucan x1  Paroxysmal A. Fib: On carvedilol and Eliquis at home. -Change carvedilol to bisoprolol in the setting of COPD-beta-1 selective -Continue home Eliquis  Uncontrolled IDDM-2 with hyperglycemia: On Toujeo 50 units daily with sliding scale -Check hemoglobin A1c -Increase Lantus from 30 to 40 units nightly -Continue Tradjenta and SSI-moderate -Added NovoLog 6 units AC -Continue statin and gabapentin.  CAD/CABG: No chest pain no anginal symptoms. -Bisoprolol as above -Continue home Imdur, hydralazine, statin and Eliquis.  Hold aspirin.  PAD/right BKA -Continue statin.  Hold ASA in the setting of anemia  Essential hypertension: -Clonidine, bisoprolol, Imdur, hydralazine and diuretics as above.  Anxiety/depression/insomnia: Stable -Continue gabapentin, Zoloft, Atarax, melatonin  GERD -Continue Protonix and Carafate               DVT prophylaxis: On Eliquis Code Status: Full code Family Communication: Patient and/or RN. Available if any question. Disposition Plan: Remains inpatient Consultants: None   Microbiology summarized: 12/9-COVID-19 positive at outside hospital. 12/12-urine culture pending.  Sch Meds:  Scheduled Meds: . sodium chloride   Intravenous Once  . apixaban  2.5 mg Oral BID  . aspirin EC  81 mg Oral Daily  . atorvastatin  40 mg Oral q1800  . carvedilol  25 mg Oral BID WC  . dexamethasone (DECADRON) injection  6 mg Intravenous QHS  . fluconazole  150 mg Oral  Once  . furosemide  40 mg Intravenous BID  . gabapentin  300 mg Oral TID  . hydrALAZINE  75 mg Oral Q8H  . insulin aspart  0-15 Units Subcutaneous TID WC  . insulin aspart  0-5 Units Subcutaneous QHS  . insulin glargine  30 Units Subcutaneous QHS  . ipratropium  2 puff Inhalation Q4H  . isosorbide mononitrate  120 mg Oral Daily  . linagliptin  5 mg Oral Daily  . mouth rinse  15 mL Mouth Rinse BID  . mometasone-formoterol  2 puff Inhalation BID  . pantoprazole  40 mg Oral Daily  . potassium chloride  20 mEq Oral Daily  . sertraline  100 mg Oral Daily  . sodium chloride flush  3 mL Intravenous Q12H  . vitamin C  500 mg Oral Daily  . zinc sulfate  220 mg Oral Daily   Continuous Infusions: . [START ON 12/17/2018] remdesivir 100 mg in NS 100 mL     PRN Meds:.albuterol, alum & mag hydroxide-simeth, chlorpheniramine-HYDROcodone, guaiFENesin-dextromethorphan, hydrOXYzine, ondansetron (ZOFRAN) IV, promethazine  Antimicrobials: Anti-infectives (From admission, onward)   Start     Dose/Rate Route Frequency Ordered Stop   12/17/18 1000  remdesivir 100 mg in sodium chloride 0.9 % 100 mL IVPB  100 mg 200 mL/hr over 30 Minutes Intravenous Daily 12/16/18 0019 12/21/18 0959   12/16/18 1500  fluconazole (DIFLUCAN) tablet 150 mg     150 mg Oral  Once 12/16/18 1318     12/16/18 0100  remdesivir 200 mg in sodium chloride 0.9% 250 mL IVPB     200 mg 580 mL/hr over 30 Minutes Intravenous Once 12/16/18 0019 12/16/18 0700       I have personally reviewed the following labs and images: CBC: Recent Labs  Lab 12/15/18 2037 12/16/18 0248  WBC 5.1 4.2  NEUTROABS 4.2 3.3  HGB 7.1* 6.5*  HCT 22.0* 20.9*  MCV 92.8 93.7  PLT 130* 115*   BMP &GFR Recent Labs  Lab 12/15/18 2037 12/16/18 0248  NA 140 139  K 3.8 3.7  CL 106 105  CO2 23 24  GLUCOSE 262* 229*  BUN 58* 54*  CREATININE 1.65* 1.57*  CALCIUM 9.4 9.2  MG  --  1.9  PHOS  --  3.5   CrCl cannot be calculated (Unknown ideal  weight.). Liver & Pancreas: Recent Labs  Lab 12/15/18 2037 12/16/18 0248  AST 15 14*  ALT 20 20  ALKPHOS 110 99  BILITOT 1.1 0.9  PROT 6.3* 5.9*  ALBUMIN 3.1* 2.8*   No results for input(s): LIPASE, AMYLASE in the last 168 hours. No results for input(s): AMMONIA in the last 168 hours. Diabetic: Recent Labs    12/16/18 0248  HGBA1C 6.8*   Recent Labs  Lab 12/16/18 0151 12/16/18 0923 12/16/18 1200  GLUCAP 216* 220* 261*   Cardiac Enzymes: No results for input(s): CKTOTAL, CKMB, CKMBINDEX, TROPONINI in the last 168 hours. No results for input(s): PROBNP in the last 8760 hours. Coagulation Profile: No results for input(s): INR, PROTIME in the last 168 hours. Thyroid Function Tests: No results for input(s): TSH, T4TOTAL, FREET4, T3FREE, THYROIDAB in the last 72 hours. Lipid Profile: No results for input(s): CHOL, HDL, LDLCALC, TRIG, CHOLHDL, LDLDIRECT in the last 72 hours. Anemia Panel: Recent Labs    12/16/18 0246 12/16/18 0248  VITAMINB12 416  --   FOLATE 39.2  --   FERRITIN  --  1,544*  TIBC 136*  --   IRON 44  --   RETICCTPCT 4.7*  --    Urine analysis:    Component Value Date/Time   COLORURINE YELLOW 12/16/2018 0013   APPEARANCEUR CLEAR 12/16/2018 0013   LABSPEC 1.011 12/16/2018 0013   PHURINE 6.0 12/16/2018 0013   GLUCOSEU 50 (A) 12/16/2018 0013   HGBUR NEGATIVE 12/16/2018 0013   BILIRUBINUR NEGATIVE 12/16/2018 0013   KETONESUR NEGATIVE 12/16/2018 0013   PROTEINUR 100 (A) 12/16/2018 0013   NITRITE NEGATIVE 12/16/2018 0013   LEUKOCYTESUR SMALL (A) 12/16/2018 0013   Sepsis Labs: Invalid input(s): PROCALCITONIN, LACTICIDVEN  Microbiology: No results found for this or any previous visit (from the past 240 hour(s)).  Radiology Studies: DG Chest Port 1 View  Result Date: 12/15/2018 CLINICAL DATA:  COVID positive.  No complaints. EXAM: PORTABLE CHEST 1 VIEW COMPARISON:  08/09/2017. FINDINGS: Hazy opacity noted in the left mid lung. More subtle hazy  opacity suggested in the right mid to lower lung. There are prominent bronchovascular markings bilaterally. No convincing pleural effusion.  Pneumothorax. Stable changes prior cardiac surgery. Cardiac silhouette enlarged. No mediastinal or hilar masses. IMPRESSION: 1. Subtle hazy airspace opacity in mid to lower lung and possibly in the right mid to lower lung as well. Study limited by the semi-erect AP portable technique. Findings consistent multifocal  infection in the clinical setting. Electronically Signed   By: Lajean Manes M.D.   On: 12/15/2018 20:39    45 minutes with more than 50% spent in reviewing records, counseling patient/family and coordinating care.   Nicolasa Milbrath T. [place]  If 7PM-7AM, please contact night-coverage [email or web address] Password [email or web address] 12/16/2018, 2:03 PM

## 2018-12-16 NOTE — Progress Notes (Addendum)
CRITICAL VALUE ALERT  Critical Value: Hgb-6.5  Date & Time Notied:  12/16/2018 at [place]  Provider Notified: B.Kyere  Orders Received/Actions taken: order for one unit PRBC transfuse.

## 2018-12-17 DIAGNOSIS — E1165 Type 2 diabetes mellitus with hyperglycemia: Secondary | ICD-10-CM

## 2018-12-17 DIAGNOSIS — N179 Acute kidney failure, unspecified: Secondary | ICD-10-CM

## 2018-12-17 DIAGNOSIS — N1831 Chronic kidney disease, stage 3a: Secondary | ICD-10-CM

## 2018-12-17 LAB — GLUCOSE, CAPILLARY
Glucose-Capillary: 239 mg/dL — ABNORMAL HIGH (ref 70–99)
Glucose-Capillary: 387 mg/dL — ABNORMAL HIGH (ref 70–99)
Glucose-Capillary: 372 mg/dL — ABNORMAL HIGH (ref 70–99)
Glucose-Capillary: 295 mg/dL — ABNORMAL HIGH (ref 70–99)

## 2018-12-17 LAB — TYPE AND SCREEN
ABO/RH(D): O NEG
Antibody Screen: NEGATIVE
Unit division: 0

## 2018-12-17 LAB — CBC
HCT: 28.6 % — ABNORMAL LOW (ref 36.0–46.0)
Hemoglobin: 9.5 g/dL — ABNORMAL LOW (ref 12.0–15.0)
MCH: 30.4 pg (ref 26.0–34.0)
MCHC: 33.2 g/dL (ref 30.0–36.0)
MCV: 91.7 fL (ref 80.0–100.0)
Platelets: 130 10*3/uL — ABNORMAL LOW (ref 150–400)
RBC: 3.12 MIL/uL — ABNORMAL LOW (ref 3.87–5.11)
RDW: 17.2 % — ABNORMAL HIGH (ref 11.5–15.5)
WBC: 5.6 10*3/uL (ref 4.0–10.5)
nRBC: 0 % (ref 0.0–0.2)

## 2018-12-17 LAB — BASIC METABOLIC PANEL
Anion gap: 9 (ref 5–15)
BUN: 62 mg/dL — ABNORMAL HIGH (ref 6–20)
CO2: 24 mmol/L (ref 22–32)
Calcium: 9.6 mg/dL (ref 8.9–10.3)
Chloride: 102 mmol/L (ref 98–111)
Creatinine, Ser: 1.75 mg/dL — ABNORMAL HIGH (ref 0.44–1.00)
GFR calc Af Amer: 36 mL/min — ABNORMAL LOW (ref >60)
GFR calc non Af Amer: 31 mL/min — ABNORMAL LOW (ref >60)
Glucose, Bld: 248 mg/dL — ABNORMAL HIGH (ref 70–99)
Potassium: 4.9 mmol/L (ref 3.5–5.1)
Sodium: 135 mmol/L (ref 135–145)

## 2018-12-17 LAB — HEMOGLOBIN A1C
Hgb A1c MFr Bld: 6.6 % — ABNORMAL HIGH (ref 4.8–5.6)
Mean Plasma Glucose: 142.72 mg/dL

## 2018-12-17 LAB — BPAM RBC
Blood Product Expiration Date: 202101082359
ISSUE DATE / TIME: 202012131506
Unit Type and Rh: 9500

## 2018-12-17 LAB — PHOSPHORUS: Phosphorus: 4.4 mg/dL (ref 2.5–4.6)

## 2018-12-17 LAB — HEPATITIS PANEL, ACUTE
HCV Ab: NONREACTIVE
Hep A IgM: NONREACTIVE
Hep B C IgM: NONREACTIVE
Hepatitis B Surface Ag: NONREACTIVE

## 2018-12-17 LAB — MAGNESIUM: Magnesium: 2 mg/dL (ref 1.7–2.4)

## 2018-12-17 LAB — C-REACTIVE PROTEIN: CRP: 0.5 mg/dL (ref <1.0)

## 2018-12-17 LAB — FERRITIN: Ferritin: 1882 ng/mL — ABNORMAL HIGH (ref 11–307)

## 2018-12-17 LAB — D-DIMER, QUANTITATIVE: D-Dimer, Quant: 1.59 ug/mL-FEU — ABNORMAL HIGH (ref 0.00–0.50)

## 2018-12-17 MED ORDER — INSULIN ASPART 100 UNIT/ML ~~LOC~~ SOLN
0.0000 [IU] | Freq: Three times a day (TID) | SUBCUTANEOUS | Status: DC
  Administered 2018-12-17: 20 [IU] via SUBCUTANEOUS
  Administered 2018-12-18: 11 [IU] via SUBCUTANEOUS
  Administered 2018-12-18: 4 [IU] via SUBCUTANEOUS
  Administered 2018-12-19: 11 [IU] via SUBCUTANEOUS
  Administered 2018-12-19 (×2): 7 [IU] via SUBCUTANEOUS
  Administered 2018-12-20 (×2): 3 [IU] via SUBCUTANEOUS
  Administered 2018-12-20: 4 [IU] via SUBCUTANEOUS
  Administered 2018-12-21 – 2018-12-22 (×3): 3 [IU] via SUBCUTANEOUS
  Administered 2018-12-23: 4 [IU] via SUBCUTANEOUS
  Administered 2018-12-23: 3 [IU] via SUBCUTANEOUS
  Administered 2018-12-23 – 2018-12-24 (×2): 4 [IU] via SUBCUTANEOUS
  Administered 2018-12-24: 7 [IU] via SUBCUTANEOUS
  Administered 2018-12-25 (×2): 4 [IU] via SUBCUTANEOUS
  Administered 2018-12-25: 7 [IU] via SUBCUTANEOUS
  Administered 2018-12-26: 4 [IU] via SUBCUTANEOUS
  Administered 2018-12-26: 3 [IU] via SUBCUTANEOUS

## 2018-12-17 MED ORDER — INSULIN GLARGINE 100 UNIT/ML ~~LOC~~ SOLN
50.0000 [IU] | Freq: Every day | SUBCUTANEOUS | Status: DC
  Administered 2018-12-17 – 2018-12-18 (×2): 50 [IU] via SUBCUTANEOUS
  Filled 2018-12-17 (×3): qty 0.5

## 2018-12-17 MED ORDER — INSULIN ASPART 100 UNIT/ML ~~LOC~~ SOLN
0.0000 [IU] | Freq: Every day | SUBCUTANEOUS | Status: DC
  Administered 2018-12-17 – 2018-12-18 (×2): 3 [IU] via SUBCUTANEOUS
  Administered 2018-12-25: 2 [IU] via SUBCUTANEOUS

## 2018-12-17 MED ORDER — ACETAMINOPHEN 325 MG PO TABS
650.0000 mg | ORAL_TABLET | Freq: Four times a day (QID) | ORAL | Status: DC | PRN
  Administered 2018-12-17 – 2018-12-19 (×4): 650 mg via ORAL
  Filled 2018-12-17 (×4): qty 2

## 2018-12-17 MED ORDER — INSULIN ASPART 100 UNIT/ML ~~LOC~~ SOLN
6.0000 [IU] | Freq: Three times a day (TID) | SUBCUTANEOUS | Status: DC
  Administered 2018-12-17 – 2018-12-19 (×5): 6 [IU] via SUBCUTANEOUS

## 2018-12-17 NOTE — Progress Notes (Signed)
PROGRESS NOTE  Martha Carter:[id number] DOB: 05/04/1960   PCP: System, Pcp Not In  Patient is from: [facility].  Uses wheelchair at baseline.  DOA: 12/15/2018 LOS: 2  Brief Narrative / Interim history: 58 year old female with history of diastolic CHF, COPD and chronic RF on 3 to 5 L, CAD/CABG, paroxysmal A. fib on Eliquis, CKD-2, anemia of chronic disease, IDDM-2, PAD/right BKA, HTN, anxiety and depression are recent diagnosis was COVID-19 on 12/12/2018 presenting with shortness of breath, productive cough and lower extremity edema.  Tested positive for Covid on 12/12/2018 to [facility] ED. CXR suggestive for mild interstitial edema.  CTA negative for PE but concerning for CHF, hepatomegaly and splenomegaly.  Received IV Decadron 10 mg and albuterol inhalers and she was transferred from his usual [facility] to [facility].   Patient attributes her symptoms to lack of care and not getting her medications at [facility] facility.  In ED, hemodynamically stable.  Saturating 100% on 5 L.  Hgb 7.1 (baseline 8.5-9.5, and 7.5 on 12/9).  Platelet 130 K.  Creatinine 1.65.  Total bili 1.1.  BNP 370.  CXR with hazy airspace opacities in left right midlungs.  Received IV Lasix and admitted for COVID-19 pneumonia, acute on chronic diastolic CHF and COPD, and AKI.  Subjective: No major events overnight of this morning.  Reports pain across upper abdomen.  Not able to describe.  Also reports shortness of breath and coughing.  Denies chest pain.  Reports making good urine.  She is very anxious about going back to her new facility.  Objective: Vitals:   12/16/18 1931 12/16/18 2049 12/17/18 0448 12/17/18 0500  BP: (!) 157/72 128/64 133/81   Pulse: 84 83 77   Resp:  20 16   Temp:  97.9 F (36.6 C) 97.7 F (36.5 C)   TempSrc:  Oral Oral   SpO2:  100% 100%   Weight:    86.7 kg    Intake/Output Summary (Last 24 hours) at 12/17/2018 1407 Last data filed at 12/17/2018 1335 Gross per 24 hour  Intake  1334.28 ml  Output 500 ml  Net 834.28 ml   Filed Weights   12/16/18 0454 12/17/18 0500  Weight: 84.8 kg 86.7 kg    Examination:  GENERAL: No acute distress.  Appears well.  HEENT: MMM.  Vision and hearing grossly intact.  NECK: Supple.  No apparent JVD.  RESP:  No IWOB.  Diminished aeration bilaterally. CVS:  RRR. Heart sounds normal.  ABD/GI/GU: Bowel sounds present. Soft.  Mild tenderness across upper abdomen. MSK/EXT: Right BKA.  1+ pitting edema in LLE. SKIN: no apparent skin lesion or wound NEURO: Awake, alert and oriented appropriately.  No apparent focal neuro deficit. PSYCH: Calm. Normal affect.  Procedures:  None  Assessment & Plan: COPD exacerbation due to COVID-19 pneumonia/infection impression with chronic respiratory failure on 3 to 5 L at baseline.  Exhibits cardinal symptoms of COPD exacerbation.  Saturating at 100% on 4 L. -Tested positive for COVID-19 on 12/12/18 at outside hospital. -Pro-Cal, LDH CRP within normal..  D-dimer and ferritin elevated to 1.65.  Recent Labs    12/16/18 0248 12/17/18 0853  DDIMER 1.65* 1.59*  FERRITIN 1,544* 1,882*  LDH 158  --   CRP 0.8 0.5  -Remdesivir and Decadron 12/12>> -Continue inhalers, mucolytic's, antitussive, incentive spirometry, flutter valve, vitamin C and zinc. -Trend inflammatory markers. -Minimal oxygen to maintain saturation between 88 and 92% -OOB/PT/OT  Acute on chronic diastolic CHF: Echo in 05/6385 with EF of  55 to 65%, G1 DD.  Presents with dyspnea and cough.  Has 1+ pitting edema on exam.  She denies orthopnea or PND.  BNP elevated could be falsely low given her weight.  On Bumex and Lasix at facility.  Reportedly has has not been getting her medication at any facility.  I and O incomplete but reports good urine output.  Renal function up trended. -Hold diuretics in the setting of worsening renal function-has already received morning dose. -Monitor fluid status, renal function and electrolytes. -Sodium  and fluid restriction.  AKI on CKD-3a/azotemia: Creatinine slightly up likely due to diuretics -Creatinine 1.1-1.2 (baseline)> 1.65 (admit)> 1.57> 1.75 -Hold diuretics -Continue monitoring  Anemia of chronic disease: Anemia panel consistent with this.  CTA chest concerning for hepatomegaly and splenomegaly concerning for sequestration.  Reportedly has upcoming appointment with hematologist for Procrit. -Hgb 8-9 (baseline)> 7.5 (12/9)> 7.1 (admit)> 6.5>1u> 8.4> 9.5--hemoconcentration? -Closely monitor H&H -Continue p.o. iron and folic acid.  Hepatomegaly and splenomegaly: Due to CHF?  Could be contributing to her anemia. -Manage CHF as above -Outpatient follow-up.  Dysuria: urinalysis not convincing.  She thinks she might have yeast infection -Empirically start ceftriaxone 11/13 -Follow urine culture -Diflucan x1 on 11/13  Paroxysmal A. Fib: On carvedilol and Eliquis at home. -Changed carvedilol to bisoprolol in the setting of COPD-beta-1 selective -Continue home Eliquis  Uncontrolled IDDM-2 with hyperglycemia: On Toujeo 50 units daily with sliding scale.  A1c 6.8%. Recent Labs    12/16/18 2043 12/17/18 0856 12/17/18 1150  GLUCAP 192* 239* 387*  -Increase Lantus 40 to 50 units at bedtime -Continue Tradjenta and NovoLog 6 units AC -Increase NovoLog from 4 to 6 units before meals and SSI to resistant -Continue statin and gabapentin.  CAD/CABG: No chest pain no anginal symptoms. -Bisoprolol as above -Continue home Imdur, hydralazine, statin and Eliquis.   PAD/right BKA -Continue statin.  Continue holding aspirin  Essential hypertension: -Clonidine, bisoprolol, Imdur, hydralazine and diuretics as above.  Anxiety/depression/insomnia: somewhat anxious about going back to the new facility. -Continue gabapentin, Zoloft, Atarax, melatonin  GERD -Continue Protonix and Carafate  Thrombocytopenia: Improved. -Continue monitoring  Nausea and emesis -Zofran and Phenergan.                 DVT prophylaxis: On Eliquis Code Status: Full code Family Communication: Patient and/or RN. Available if any question. Disposition Plan: Remains inpatient.  Very anxious about going back to the new facility.  Will consult care management. Consultants: None   Microbiology summarized: 12/9-COVID-19 positive at outside hospital. 12/12-urine culture pending.  Sch Meds:  Scheduled Meds: . sodium chloride   Intravenous Once  . apixaban  2.5 mg Oral BID  . atorvastatin  40 mg Oral q1800  . bisoprolol  5 mg Oral Daily  . cholecalciferol  2,000 Units Oral Daily  . dexamethasone (DECADRON) injection  6 mg Intravenous QHS  . folic acid  1 mg Oral Daily  . gabapentin  300 mg Oral TID  . hydrALAZINE  75 mg Oral Q8H  . insulin aspart  0-20 Units Subcutaneous TID WC  . insulin aspart  0-5 Units Subcutaneous QHS  . insulin aspart  6 Units Subcutaneous TID WC  . insulin glargine  50 Units Subcutaneous QHS  . ipratropium  2 puff Inhalation Q4H  . iron polysaccharides  150 mg Oral Daily  . isosorbide mononitrate  120 mg Oral Daily  . linagliptin  5 mg Oral Daily  . mouth rinse  15 mL Mouth Rinse BID  . Melatonin  3 mg Oral QHS  . mometasone-formoterol  2 puff Inhalation BID  . multivitamin with minerals  1 tablet Per Tube Daily  . pantoprazole  40 mg Oral Daily  . sertraline  100 mg Oral Daily  . sodium chloride flush  3 mL Intravenous Q12H  . sucralfate  1 g Oral TID  . vitamin B-12  1,000 mcg Oral Daily  . vitamin C  500 mg Oral Daily  . zinc sulfate  220 mg Oral Daily   Continuous Infusions: . cefTRIAXone (ROCEPHIN)  IV Stopped (12/16/18 1813)  . remdesivir 100 mg in NS 100 mL 100 mg (12/17/18 0938)   PRN Meds:.albuterol, alum & mag hydroxide-simeth, chlorpheniramine-HYDROcodone, cloNIDine, guaiFENesin-dextromethorphan, hydrOXYzine, ondansetron (ZOFRAN) IV, polyvinyl alcohol, promethazine  Antimicrobials: Anti-infectives (From admission, onward)   Start      Dose/Rate Route Frequency Ordered Stop   12/17/18 1000  remdesivir 100 mg in sodium chloride 0.9 % 100 mL IVPB     100 mg 200 mL/hr over 30 Minutes Intravenous Daily 12/16/18 0019 12/21/18 0959   12/16/18 1600  cefTRIAXone (ROCEPHIN) 1 g in sodium chloride 0.9 % 100 mL IVPB     1 g 200 mL/hr over 30 Minutes Intravenous Every 24 hours 12/16/18 1440     12/16/18 1500  fluconazole (DIFLUCAN) tablet 150 mg     150 mg Oral  Once 12/16/18 1318 12/16/18 1800   12/16/18 0100  remdesivir 200 mg in sodium chloride 0.9% 250 mL IVPB     200 mg 580 mL/hr over 30 Minutes Intravenous Once 12/16/18 0019 12/16/18 0700       I have personally reviewed the following labs and images: CBC: Recent Labs  Lab 12/15/18 2037 12/16/18 0248 12/16/18 2116 12/17/18 0853  WBC 5.1 4.2  --  5.6  NEUTROABS 4.2 3.3  --   --   HGB 7.1* 6.5* 8.4* 9.5*  HCT 22.0* 20.9* 26.0* 28.6*  MCV 92.8 93.7  --  91.7  PLT 130* 115*  --  130*   BMP &GFR Recent Labs  Lab 12/15/18 2037 12/16/18 0248 12/17/18 0853  NA 140 139 135  K 3.8 3.7 4.9  CL 106 105 102  CO2 GLUCOSE 262* 229* 248*  BUN 58* 54* 62*  CREATININE 1.65* 1.57* 1.75*  CALCIUM 9.4 9.2 9.6  MG  --  1.9 2.0  PHOS  --  3.5 4.4   CrCl cannot be calculated (Unknown ideal weight.). Liver & Pancreas: Recent Labs  Lab 12/15/18 2037 12/16/18 0248  AST 15 14*  ALT 20 20  ALKPHOS 110 99  BILITOT 1.1 0.9  PROT 6.3* 5.9*  ALBUMIN 3.1* 2.8*   No results for input(s): LIPASE, AMYLASE in the last 168 hours. No results for input(s): AMMONIA in the last 168 hours. Diabetic: Recent Labs    12/16/18 0248 12/16/18 2116  HGBA1C 6.8* 6.6*   Recent Labs  Lab 12/16/18 1200 12/16/18 1817 12/16/18 2043 12/17/18 0856 12/17/18 1150  GLUCAP 261* 215* 192* 239* 387*   Cardiac Enzymes: No results for input(s): CKTOTAL, CKMB, CKMBINDEX, TROPONINI in the last 168 hours. No results for input(s): PROBNP in the last 8760 hours. Coagulation  Profile: No results for input(s): INR, PROTIME in the last 168 hours. Thyroid Function Tests: No results for input(s): TSH, T4TOTAL, FREET4, T3FREE, THYROIDAB in the last 72 hours. Lipid Profile: No results for input(s): CHOL, HDL, LDLCALC, TRIG, CHOLHDL, LDLDIRECT in the last 72 hours. Anemia Panel: Recent Labs    12/16/18 0246  12/16/18 0248 12/17/18 0853  VITAMINB12 416  --   --   FOLATE 39.2  --   --   FERRITIN  --  1,544* 1,882*  TIBC 136*  --   --   IRON 44  --   --   RETICCTPCT 4.7*  --   --    Urine analysis:    Component Value Date/Time   COLORURINE YELLOW 12/16/2018 0013   APPEARANCEUR CLEAR 12/16/2018 0013   LABSPEC 1.011 12/16/2018 0013   PHURINE 6.0 12/16/2018 0013   GLUCOSEU 50 (A) 12/16/2018 0013   HGBUR NEGATIVE 12/16/2018 0013   BILIRUBINUR NEGATIVE 12/16/2018 0013   KETONESUR NEGATIVE 12/16/2018 0013   PROTEINUR 100 (A) 12/16/2018 0013   NITRITE NEGATIVE 12/16/2018 0013   LEUKOCYTESUR SMALL (A) 12/16/2018 0013   Sepsis Labs: Invalid input(s): PROCALCITONIN, LACTICIDVEN  Microbiology: No results found for this or any previous visit (from the past 240 hour(s)).  Radiology Studies: No results found.  Martha Lucien T. Dejae Bernet [facility]  If 7PM-7AM, please contact night-coverage [email or web address] Password [email or web address] 12/17/2018, 2:07 PM

## 2018-12-17 NOTE — Progress Notes (Signed)
Inpatient Diabetes Program Recommendations  AACE/ADA: New Consensus Statement on Inpatient Glycemic Control (2015)  Target Ranges:  Prepandial:   less than 140 mg/dL      Peak postprandial:   less than 180 mg/dL (1-2 hours)      Critically ill patients:  140 - 180 mg/dL   Lab Results  Component Value Date   GLUCAP 239 (H) 12/17/2018   HGBA1C 6.6 (H) 12/16/2018    Review of Glycemic Control Results for CATY, TESSLER (MRN [id number]) as of 12/17/2018 09:27  Ref. Range 12/16/2018 09:23 12/16/2018 12:00 12/16/2018 18:17 12/16/2018 20:43 12/17/2018 08:56  Glucose-Capillary Latest Ref Range: 70 - 99 mg/dL 220 (H) 261 (H) 215 (H) 192 (H) 239 (H)   Diabetes history: DM 2 Outpatient Diabetes medications: Toujeo 50 units, Humalog 0-16 units tid Current orders for Inpatient glycemic control:  Lantus 40 units Novolog 0-15 units tid + hs Novolog 4 units tid meal coverage Tradjenta 5 mg Daily  A1c 6.8% on 12/13 Decadron 6 mg qhs BUN/Creat: 62/1.75 No PO supplementation at this time  Inpatient Diabetes Program Recommendations:    Pt on steroids glucose trends still in low 200 range.  Consider increasing Lantus to home dose 50 units.  Thanks,  Tama Headings RN, MSN, BC-ADM Inpatient Diabetes Coordinator Team Pager [phone] (8a-5p)

## 2018-12-18 LAB — C-REACTIVE PROTEIN: CRP: 1 mg/dL — ABNORMAL HIGH (ref <1.0)

## 2018-12-18 LAB — COMPREHENSIVE METABOLIC PANEL
ALT: 15 U/L (ref 0–44)
AST: 12 U/L — ABNORMAL LOW (ref 15–41)
Albumin: 2.8 g/dL — ABNORMAL LOW (ref 3.5–5.0)
Alkaline Phosphatase: 106 U/L (ref 38–126)
Anion gap: 11 (ref 5–15)
BUN: 67 mg/dL — ABNORMAL HIGH (ref 6–20)
CO2: 23 mmol/L (ref 22–32)
Calcium: 9.5 mg/dL (ref 8.9–10.3)
Chloride: 101 mmol/L (ref 98–111)
Creatinine, Ser: 1.89 mg/dL — ABNORMAL HIGH (ref 0.44–1.00)
GFR calc Af Amer: 33 mL/min — ABNORMAL LOW (ref >60)
GFR calc non Af Amer: 29 mL/min — ABNORMAL LOW (ref >60)
Glucose, Bld: 221 mg/dL — ABNORMAL HIGH (ref 70–99)
Potassium: 4.5 mmol/L (ref 3.5–5.1)
Sodium: 135 mmol/L (ref 135–145)
Total Bilirubin: 0.6 mg/dL (ref 0.3–1.2)
Total Protein: 5.7 g/dL — ABNORMAL LOW (ref 6.5–8.1)

## 2018-12-18 LAB — CBC
HCT: 29.7 % — ABNORMAL LOW (ref 36.0–46.0)
Hemoglobin: 9.6 g/dL — ABNORMAL LOW (ref 12.0–15.0)
MCH: 30.5 pg (ref 26.0–34.0)
MCHC: 32.3 g/dL (ref 30.0–36.0)
MCV: 94.3 fL (ref 80.0–100.0)
Platelets: 127 10*3/uL — ABNORMAL LOW (ref 150–400)
RBC: 3.15 MIL/uL — ABNORMAL LOW (ref 3.87–5.11)
RDW: 17.5 % — ABNORMAL HIGH (ref 11.5–15.5)
WBC: 9.2 10*3/uL (ref 4.0–10.5)
nRBC: 0 % (ref 0.0–0.2)

## 2018-12-18 LAB — MAGNESIUM: Magnesium: 2 mg/dL (ref 1.7–2.4)

## 2018-12-18 LAB — PHOSPHORUS: Phosphorus: 4 mg/dL (ref 2.5–4.6)

## 2018-12-18 LAB — GLUCOSE, CAPILLARY
Glucose-Capillary: 228 mg/dL — ABNORMAL HIGH (ref 70–99)
Glucose-Capillary: 267 mg/dL — ABNORMAL HIGH (ref 70–99)
Glucose-Capillary: 252 mg/dL — ABNORMAL HIGH (ref 70–99)

## 2018-12-18 LAB — FERRITIN: Ferritin: 1782 ng/mL — ABNORMAL HIGH (ref 11–307)

## 2018-12-18 LAB — D-DIMER, QUANTITATIVE: D-Dimer, Quant: 1.33 ug/mL-FEU — ABNORMAL HIGH (ref 0.00–0.50)

## 2018-12-18 MED ORDER — HYDROCODONE-ACETAMINOPHEN 7.5-325 MG PO TABS
1.0000 | ORAL_TABLET | Freq: Once | ORAL | Status: AC
  Administered 2018-12-18: 1 via ORAL
  Filled 2018-12-18: qty 1

## 2018-12-18 MED ORDER — BISOPROLOL FUMARATE 5 MG PO TABS
10.0000 mg | ORAL_TABLET | Freq: Every day | ORAL | Status: DC
  Administered 2018-12-19 – 2018-12-26 (×8): 10 mg via ORAL
  Filled 2018-12-18 (×8): qty 2

## 2018-12-18 NOTE — Progress Notes (Signed)
OT Cancellation Note  Patient Details Name: Martha Carter MRN: [id number] DOB: 1960/06/16   Cancelled Treatment:    Reason Eval/Treat Not Completed: Other (comment);Fatigue/lethargy limiting ability to participate   OT spoke with pt and pt not agreeable to OT this day.  Pt is requesting cough meds.  RN notified  [name], [facility] [phone] Office- [phone], [name] 12/18/2018, 3:38 PM

## 2018-12-18 NOTE — Progress Notes (Signed)
Inpatient Diabetes Program Recommendations  AACE/ADA: New Consensus Statement on Inpatient Glycemic Control (2015)  Target Ranges:  Prepandial:   less than 140 mg/dL      Peak postprandial:   less than 180 mg/dL (1-2 hours)      Critically ill patients:  140 - 180 mg/dL   Lab Results  Component Value Date   GLUCAP 228 (H) 12/18/2018   HGBA1C 6.6 (H) 12/16/2018    Review of Glycemic Control   Diabetes history: DM 2 Outpatient Diabetes medications: Toujeo 50 units, Humalog 0-16 units tid Current orders for Inpatient glycemic control:  Lantus 50 units Novolog 0-20 units tid + hs Novolog 6 units tid meal coverage Tradjenta 5 mg Daily  A1c 6.8% on 12/13 Decadron 6 mg qhs BUN/Creat: 62/1.75 No PO supplementation at this time  Inpatient Diabetes Program Recommendations:    Noted all insulin increased yesterday. Glucose still in lower 200 range. Pt still getting decadron for a few more days.  Consider increasing Lantus to 60 units.  Thanks,  Tama Headings RN, MSN, BC-ADM Inpatient Diabetes Coordinator Team Pager [phone] (8a-5p)

## 2018-12-18 NOTE — Progress Notes (Signed)
PROGRESS NOTE  Martha Carter ZOX:[id number] DOB: 01-24-1960   PCP:GEORGIA DELSIGNOREot In  Patient is from: [facility].  Uses wheelchair at baseline.  DOA: 12/15/2018 LOS: 3  Brief Narrative / Interim history: 58 year old female with history of diastolic CHF, COPD and chronic RF on 3 to 5 L, CAD/CABG, paroxysmal A. fib on Eliquis, CKD-2, anemia of chronic disease, IDDM-2, PAD/right BKA, HTN, anxiety and depression are recent diagnosis was COVID-19 on 12/12/2018 presenting with shortness of breath, productive cough and lower extremity edema.  Tested positive for Covid on 12/12/2018 to [facility] ED. CXR suggestive for mild interstitial edema.  CTA negative for PE but concerning for CHF, hepatomegaly and splenomegaly.  Received IV Decadron 10 mg and albuterol inhalers and she was transferred from his usual [facility] to [facility].   Patient attributes her symptoms to lack of care and not getting her medications at [facility] facility.  In ED, hemodynamically stable.  Saturating 100% on 5 L.  Hgb 7.1 (baseline 8.5-9.5, and 7.5 on 12/9).  Platelet 130 K.  Creatinine 1.65.  Total bili 1.1.  BNP 370.  CXR with hazy airspace opacities in left right midlungs.  Received IV Lasix and admitted for COVID-19 pneumonia, acute on chronic diastolic CHF and COPD, and AKI.  Subjective: No major events overnight of this morning.  Continues to complain headache and pain across her upper abdomen on both sides.  Reports some improvement in her breathing and cough.  Denies chest pain or UTI symptoms.  Objective: Vitals:   12/17/18 2103 12/18/18 0433 12/18/18 0442 12/18/18 0545  BP: (!) 154/69 (!) 168/79  (!) 155/74  Pulse: 84 81  86  Resp: 18 18    Temp: 98 F (36.7 C) 97.7 F (36.5 C)    TempSrc: Oral Oral    SpO2: 100% 99%    Weight:   87.9 kg   Height:        Intake/Output Summary (Last 24 hours) at 12/18/2018 1351 Last data filed at 12/18/2018 1130 Gross per 24 hour  Intake 820 ml  Output 1150 ml    Net -330 ml   Filed Weights   12/16/18 0454 12/17/18 0500 12/18/18 0442  Weight: 84.8 kg 86.7 kg 87.9 kg    Examination:  GENERAL: No acute distress.  Appears well.  HEENT: MMM.  Vision and hearing grossly intact.  NECK: Supple.  No apparent JVD.  RESP:  No IWOB.  Diminished aeration bilaterally. CVS:  RRR. Heart sounds normal.  ABD/GI/GU: Bowel sounds present. Soft. Non tender.  MSK/EXT: Right BKA.  1 pitting edema in LLE.  Cold to touch. SKIN: no apparent skin lesion or wound NEURO: Awake, alert and oriented appropriately.  No apparent focal neuro deficit. PSYCH: Calm. Normal affect.  Procedures:  None  Assessment & Plan: COPD exacerbation due to COVID-19 pneumonia/infection impression with chronic respiratory failure on 3 to 5 L at baseline.  Exhibits cardinal symptoms of COPD exacerbation.  Saturating at 100% on 4 L. -Tested positive for COVID-19 on 12/12/18 at outside hospital. -Pro-Cal, LDH CRP within normal..  D-dimer and ferritin elevated to 1.65.  Recent Labs    12/16/18 0248 12/17/18 0853 12/18/18 1031  DDIMER 1.65* 1.59* 1.33*  FERRITIN 1,544* 1,882* 1,782*  LDH 158  --   --   CRP 0.8 0.5 1.0*  -Remdesivir and Decadron 12/12>> -Continue inhalers, mucolytic's, antitussive, incentive spirometry, flutter valve, vitamin C and zinc. -Trend inflammatory markers-CRP slightly up. -Minimal oxygen to maintain saturation between 88 and  92% -OOB/PT/OT  Acute on chronic diastolic CHF: Echo in 08/2018 with EF of 55 to 65%, G1 DD.  Presents with dyspnea and cough.  Has 1+ pitting edema on exam.  She denies orthopnea or PND.  BNP elevated could be falsely low given her weight.  On Bumex and Lasix at facility.  Reportedly has has not been getting her medication at any facility.  About 1.2 L UOP/24 hours.  Renal function up trended. -Continue holding diuretics -Monitor fluid status, renal function and electrolytes. -Sodium and fluid restriction.  AKI on CKD-3a/azotemia:  Creatinine slightly up likely due to diuretics -Creatinine 1.1-1.2 (baseline)> 1.65 (admit)> 1.57> 1.75> 1.89 -Continue holding diuretics -Continue monitoring  Anemia of chronic disease: CTA chest concerning for hepatomegaly and splenomegaly concerning for sequestration.  Reportedly has upcoming appointment with hematologist for Procrit.  H&H stable after transfusion. -Hgb 8-9 (baseline)> 7.5 (12/9)> 7.1 (admit)> 6.5>1u> 8.4> 9.6 -Monitor H&H -Continue p.o. iron and folic acid.  Hepatomegaly and splenomegaly: due to CHF?  Could be contributing to her anemia. -Manage CHF as above -Outpatient follow-up.  Dysuria: urinalysis not convincing.  She thinks she might have yeast infection -Empirically start ceftriaxone 11/13 -Follow urine culture-still pending. -Diflucan x1 on 11/13  Paroxysmal A. Fib: On carvedilol and Eliquis at home. -Changed carvedilol to bisoprolol in the setting of COPD-beta-1 selective -Continue home Eliquis  Uncontrolled IDDM-2 with hyperglycemia: On Toujeo 50 units daily with sliding scale.  A1c 6.8%. Recent Labs    12/17/18 1627 12/17/18 2100 12/18/18 0847  GLUCAP 372* 295* 228*  -Continue Lantus at 50 units at bedtime -Continue Tradjenta and NovoLog 6 units AC -NovoLog 6 units AC and SSI-resistant -Continue statin and gabapentin.  CAD/CABG: No chest pain no anginal symptoms. -Increase bisoprolol to 10 mg -Continue home Imdur, hydralazine, statin and Eliquis.   PAD/right BKA -Continue statin.  Continue holding aspirin  Essential hypertension: -Clonidine, imdur, hydralazine and diuretics as above. -Increase bisoprolol to 10 mg  Anxiety/depression/insomnia: somewhat anxious about going back to the new facility. -Continue gabapentin, Zoloft, Atarax, melatonin  GERD -Continue Protonix and Carafate  Thrombocytopenia: Stable. -Continue monitoring  Nausea and emesis -Zofran and Phenergan as needed.               DVT prophylaxis: On  Eliquis Code Status: Full code Family Communication: Patient and/or RN. Available if any question. Disposition Plan: Remains inpatient.  Final disposition SNF when medically ready. Anxious about going back to the new facility.  Will consult care management. Consultants: None   Microbiology summarized: 12/9-COVID-19 positive at outside hospital. 12/12-urine culture pending.  Sch Meds:  Scheduled Meds: . sodium chloride   Intravenous Once  . apixaban  2.5 mg Oral BID  . atorvastatin  40 mg Oral q1800  . bisoprolol  5 mg Oral Daily  . cholecalciferol  2,000 Units Oral Daily  . dexamethasone (DECADRON) injection  6 mg Intravenous QHS  . folic acid  1 mg Oral Daily  . gabapentin  300 mg Oral TID  . hydrALAZINE  75 mg Oral Q8H  . insulin aspart  0-20 Units Subcutaneous TID WC  . insulin aspart  0-5 Units Subcutaneous QHS  . insulin aspart  6 Units Subcutaneous TID WC  . insulin glargine  50 Units Subcutaneous QHS  . ipratropium  2 puff Inhalation Q4H  . iron polysaccharides  150 mg Oral Daily  . isosorbide mononitrate  120 mg Oral Daily  . linagliptin  5 mg Oral Daily  . mouth rinse  15 mL Mouth  Rinse BID  . Melatonin  3 mg Oral QHS  . mometasone-formoterol  2 puff Inhalation BID  . multivitamin with minerals  1 tablet Per Tube Daily  . pantoprazole  40 mg Oral Daily  . sertraline  100 mg Oral Daily  . sodium chloride flush  3 mL Intravenous Q12H  . sucralfate  1 g Oral TID  . vitamin B-12  1,000 mcg Oral Daily  . vitamin C  500 mg Oral Daily  . zinc sulfate  220 mg Oral Daily   Continuous Infusions: . cefTRIAXone (ROCEPHIN)  IV 1 g (12/17/18 1545)  . remdesivir 100 mg in NS 100 mL 100 mg (12/18/18 1109)   PRN Meds:.acetaminophen, albuterol, alum & mag hydroxide-simeth, chlorpheniramine-HYDROcodone, cloNIDine, guaiFENesin-dextromethorphan, hydrOXYzine, ondansetron (ZOFRAN) IV, polyvinyl alcohol, promethazine  Antimicrobials: Anti-infectives (From admission, onward)   Start      Dose/Rate Route Frequency Ordered Stop   12/17/18 1000  remdesivir 100 mg in sodium chloride 0.9 % 100 mL IVPB     100 mg 200 mL/hr over 30 Minutes Intravenous Daily 12/16/18 0019 12/21/18 0959   12/16/18 1600  cefTRIAXone (ROCEPHIN) 1 g in sodium chloride 0.9 % 100 mL IVPB     1 g 200 mL/hr over 30 Minutes Intravenous Every 24 hours 12/16/18 1440     12/16/18 1500  fluconazole (DIFLUCAN) tablet 150 mg     150 mg Oral  Once 12/16/18 1318 12/16/18 1800   12/16/18 0100  remdesivir 200 mg in sodium chloride 0.9% 250 mL IVPB     200 mg 580 mL/hr over 30 Minutes Intravenous Once 12/16/18 0019 12/16/18 0700       I have personally reviewed the following labs and images: CBC: Recent Labs  Lab 12/15/18 2037 12/16/18 0248 12/16/18 2116 12/17/18 0853 12/18/18 1031  WBC 5.1 4.2  --  5.6 9.2  NEUTROABS 4.2 3.3  --   --   --   HGB 7.1* 6.5* 8.4* 9.5* 9.6*  HCT 22.0* 20.9* 26.0* 28.6* 29.7*  MCV 92.8 93.7  --  91.7 94.3  PLT 130* 115*  --  130* 127*   BMP &GFR Recent Labs  Lab 12/15/18 2037 12/16/18 0248 12/17/18 0853 12/18/18 1031  NA 140 139 135 135  K 3.8 3.7 4.9 4.5  CL 106 105 102 101  CO2 23 24 24 23   GLUCOSE 262* 229* 248* 221*  BUN 58* 54* 62* 67*  CREATININE 1.65* 1.57* 1.75* 1.89*  CALCIUM 9.4 9.2 9.6 9.5  MG  --  1.9 2.0 2.0  PHOS  --  3.5 4.4 4.0   Estimated Creatinine Clearance: 33 mL/min (A) (by C-G formula based on SCr of 1.89 mg/dL (H)). Liver & Pancreas: Recent Labs  Lab 12/15/18 2037 12/16/18 0248 12/18/18 1031  AST 15 14* 12*  ALT 20 20 15   ALKPHOS 110 99 106  BILITOT 1.1 0.9 0.6  PROT 6.3* 5.9* 5.7*  ALBUMIN 3.1* 2.8* 2.8*   No results for input(s): LIPASE, AMYLASE in the last 168 hours. No results for input(s): AMMONIA in the last 168 hours. Diabetic: Recent Labs    12/16/18 0248 12/16/18 2116  HGBA1C 6.8* 6.6*   Recent Labs  Lab 12/17/18 0856 12/17/18 1150 12/17/18 1627 12/17/18 2100 12/18/18 0847  GLUCAP 239* 387* 372* 295*  228*   Cardiac Enzymes: No results for input(s): CKTOTAL, CKMB, CKMBINDEX, TROPONINI in the last 168 hours. No results for input(s): PROBNP in the last 8760 hours. Coagulation Profile: No results for input(s): INR, PROTIME in  the last 168 hours. Thyroid Function Tests: No results for input(s): TSH, T4TOTAL, FREET4, T3FREE, THYROIDAB in the last 72 hours. Lipid Profile: No results for input(s): CHOL, HDL, LDLCALC, TRIG, CHOLHDL, LDLDIRECT in the last 72 hours. Anemia Panel: Recent Labs    12/16/18 0246 12/17/18 0853 12/18/18 1031  VITAMINB12 416  --   --   FOLATE 39.2  --   --   FERRITIN  --  1,882* 1,782*  TIBC 136*  --   --   IRON 44  --   --   RETICCTPCT 4.7*  --   --    Urine analysis:    Component Value Date/Time   COLORURINE YELLOW 12/16/2018 0013   APPEARANCEUR CLEAR 12/16/2018 0013   LABSPEC 1.011 12/16/2018 0013   PHURINE 6.0 12/16/2018 0013   GLUCOSEU 50 (A) 12/16/2018 0013   HGBUR NEGATIVE 12/16/2018 0013   BILIRUBINUR NEGATIVE 12/16/2018 0013   KETONESUR NEGATIVE 12/16/2018 0013   PROTEINUR 100 (A) 12/16/2018 0013   NITRITE NEGATIVE 12/16/2018 0013   LEUKOCYTESUR SMALL (A) 12/16/2018 0013   Sepsis Labs: Invalid input(s): PROCALCITONIN, LACTICIDVEN  Microbiology: No results found for this or any previous visit (from the past 240 hour(s)).  Radiology Studies: No results found.  Heyli Min T. Etheleen Valtierra [facility]  If 7PM-7AM, please contact night-coverage [email or web address] Password [email or web address] 12/18/2018, 1:51 PM

## 2018-12-18 NOTE — Care Management Important Message (Signed)
Important Message  Patient Details IM Letter given to Gabriel Earing RN Case Manager to present to the Patient Name: ADLINE KIRSHENBAUM MRN: [id number] Date of Birth: January 17, 1960   Medicare Important Message Given:  Yes     Kerin Salen 12/18/2018, 10:13 AM

## 2018-12-18 NOTE — Evaluation (Signed)
Physical Therapy Evaluation Patient Details Name: Martha Carter MRN: [id number] DOB: 1960-04-01 Today's Date: 12/18/2018   History of Present Illness  58 year old female with history of diastolic CHF, COPD and chronic RF on 3 to 5 L, CAD/CABG, paroxysmal A. fib on Eliquis, CKD-2, anemia of chronic disease, IDDM-2, PAD/right BKA, HTN, anxiety and depression are recent diagnosis was COVID-19 on 12/12/2018 in [place] and transferred from her nursing facility to [facility]  Pt admitted 12/15/18 for COPD exacerbation due to COVID-19 pneumonia/infection impression with chronic respiratory failure on 3 to 5 L at baseline.  Clinical Impression  Pt admitted with above diagnosis.  Pt currently with functional limitations due to the deficits listed below (see PT Problem List). Pt will benefit from skilled PT to increase their independence and safety with mobility to allow discharge to the venue listed below.  Pt plans to return to SNF upon d/c.  Will continue to assist pt with exercises and mobility during acute stay.  Spo2 96% on 2L O2 [place] during session     Follow Up Recommendations SNF    Equipment Recommendations  None recommended by PT    Recommendations for Other Services       Precautions / Restrictions Precautions Precautions: Fall Precaution Comments: R BKA, 3-5L O2 baseline      Mobility  Bed Mobility Overal bed mobility: Needs Assistance             General bed mobility comments: pt declined sitting EOB, however did pull upper body upright for long sitting however requires use of bed rails and holding bedrails to maintain upright posture  Transfers                    Ambulation/Gait                Stairs            Wheelchair Mobility    Modified Rankin (Stroke Patients Only)       Balance                                             Pertinent Vitals/Pain Pain Assessment: No/denies pain    Home Living  Family/patient expects to be discharged to:: Skilled nursing facility                      Prior Function Level of Independence: Needs assistance   Gait / Transfers Assistance Needed: has been working on bed exercises in SNF, awaiting prosthesis for R BKA per pt           Hand Dominance        Extremity/Trunk Assessment        Lower Extremity Assessment Lower Extremity Assessment: Generalized weakness;RLE deficits/detail RLE Deficits / Details: s/p R BKA in May 2020, appears to have full AROM at knee       Communication   Communication: No difficulties  Cognition Arousal/Alertness: Awake/alert Behavior During Therapy: WFL for tasks assessed/performed Overall Cognitive Status: Within Functional Limits for tasks assessed                                        General Comments      Exercises Total Joint Exercises Ankle Circles/Pumps: Left;10 reps;AAROM Quad Sets:  AROM;Both;10 reps Heel Slides: AAROM;Left;10 reps Hip ABduction/ADduction: AROM;Both;10 reps Straight Leg Raises: AAROM;Left;10 reps Knee Flexion: AROM;Right;10 reps   Assessment/Plan    PT Assessment Patient needs continued PT services  PT Problem List Decreased strength;Decreased balance;Decreased knowledge of use of DME;Decreased activity tolerance;Decreased mobility       PT Treatment Interventions DME instruction;Therapeutic activities;Therapeutic exercise;Functional mobility training;Balance training;Neuromuscular re-education;Patient/family education;Wheelchair mobility training    PT Goals (Current goals can be found in the Care Plan section)  Acute Rehab PT Goals PT Goal Formulation: With patient Time For Goal Achievement: 01/01/19 Potential to Achieve Goals: Good    Frequency Min 2X/week   Barriers to discharge        Co-evaluation               AM-PAC PT "6 Clicks" Mobility  Outcome Measure Help needed turning from your back to your side while in a  flat bed without using bedrails?: A Lot Help needed moving from lying on your back to sitting on the side of a flat bed without using bedrails?: Total Help needed moving to and from a bed to a chair (including a wheelchair)?: Total Help needed standing up from a chair using your arms (Carter.g., wheelchair or bedside chair)?: Total Help needed to walk in hospital room?: Total Help needed climbing 3-5 steps with a railing? : Total 6 Click Score: 7    End of Session Equipment Utilized During Treatment: Oxygen Activity Tolerance: Patient tolerated treatment well Patient left: in bed;with call bell/phone within reach;with bed alarm set Nurse Communication: Mobility status PT Visit Diagnosis: Muscle weakness (generalized) (M62.81);Other abnormalities of gait and mobility (R26.89)    Time: 1202-1222 PT Time Calculation (min) (ACUTE ONLY): 20 min   Charges:   PT Evaluation $PT Eval Low Complexity: 1 Low     Martha Carter PT, DPT Acute Rehabilitation Services Office: [phone]  Martha Carter 12/18/2018, 4:06 PM

## 2018-12-19 ENCOUNTER — Inpatient Hospital Stay (HOSPITAL_COMMUNITY): Payer: Medicare Other

## 2018-12-19 LAB — GLUCOSE, CAPILLARY
Glucose-Capillary: 241 mg/dL — ABNORMAL HIGH (ref 70–99)
Glucose-Capillary: 268 mg/dL — ABNORMAL HIGH (ref 70–99)
Glucose-Capillary: 238 mg/dL — ABNORMAL HIGH (ref 70–99)
Glucose-Capillary: 176 mg/dL — ABNORMAL HIGH (ref 70–99)

## 2018-12-19 LAB — CBC
HCT: 26.5 % — ABNORMAL LOW (ref 36.0–46.0)
Hemoglobin: 8.3 g/dL — ABNORMAL LOW (ref 12.0–15.0)
MCH: 29.4 pg (ref 26.0–34.0)
MCHC: 31.3 g/dL (ref 30.0–36.0)
MCV: 94 fL (ref 80.0–100.0)
Platelets: 121 10*3/uL — ABNORMAL LOW (ref 150–400)
RBC: 2.82 MIL/uL — ABNORMAL LOW (ref 3.87–5.11)
RDW: 17.2 % — ABNORMAL HIGH (ref 11.5–15.5)
WBC: 7.4 10*3/uL (ref 4.0–10.5)
nRBC: 0 % (ref 0.0–0.2)

## 2018-12-19 LAB — C-REACTIVE PROTEIN: CRP: 1.4 mg/dL — ABNORMAL HIGH (ref <1.0)

## 2018-12-19 LAB — COMPREHENSIVE METABOLIC PANEL
ALT: 16 U/L (ref 0–44)
AST: 14 U/L — ABNORMAL LOW (ref 15–41)
Albumin: 2.7 g/dL — ABNORMAL LOW (ref 3.5–5.0)
Alkaline Phosphatase: 94 U/L (ref 38–126)
Anion gap: 10 (ref 5–15)
BUN: 69 mg/dL — ABNORMAL HIGH (ref 6–20)
CO2: 22 mmol/L (ref 22–32)
Calcium: 9.2 mg/dL (ref 8.9–10.3)
Chloride: 104 mmol/L (ref 98–111)
Creatinine, Ser: 2.01 mg/dL — ABNORMAL HIGH (ref 0.44–1.00)
GFR calc Af Amer: 31 mL/min — ABNORMAL LOW (ref >60)
GFR calc non Af Amer: 26 mL/min — ABNORMAL LOW (ref >60)
Glucose, Bld: 277 mg/dL — ABNORMAL HIGH (ref 70–99)
Potassium: 5 mmol/L (ref 3.5–5.1)
Sodium: 136 mmol/L (ref 135–145)
Total Bilirubin: 0.2 mg/dL — ABNORMAL LOW (ref 0.3–1.2)
Total Protein: 5.7 g/dL — ABNORMAL LOW (ref 6.5–8.1)

## 2018-12-19 LAB — D-DIMER, QUANTITATIVE: D-Dimer, Quant: 1.14 ug/mL-FEU — ABNORMAL HIGH (ref 0.00–0.50)

## 2018-12-19 LAB — PHOSPHORUS: Phosphorus: 4 mg/dL (ref 2.5–4.6)

## 2018-12-19 LAB — FERRITIN: Ferritin: 1379 ng/mL — ABNORMAL HIGH (ref 11–307)

## 2018-12-19 LAB — MAGNESIUM: Magnesium: 2.2 mg/dL (ref 1.7–2.4)

## 2018-12-19 MED ORDER — INSULIN DETEMIR 100 UNIT/ML ~~LOC~~ SOLN
30.0000 [IU] | Freq: Two times a day (BID) | SUBCUTANEOUS | Status: DC
  Administered 2018-12-20 – 2018-12-22 (×5): 30 [IU] via SUBCUTANEOUS
  Filled 2018-12-19 (×7): qty 0.3

## 2018-12-19 MED ORDER — CLONIDINE HCL 0.2 MG PO TABS: 0.2000 mg | ORAL_TABLET | Freq: Two times a day (BID) | ORAL | Status: DC | PRN

## 2018-12-19 MED ORDER — INSULIN GLARGINE 100 UNIT/ML ~~LOC~~ SOLN
50.0000 [IU] | Freq: Every day | SUBCUTANEOUS | Status: AC
  Administered 2018-12-19: 50 [IU] via SUBCUTANEOUS
  Filled 2018-12-19: qty 0.5

## 2018-12-19 MED ORDER — INSULIN ASPART 100 UNIT/ML ~~LOC~~ SOLN
8.0000 [IU] | Freq: Three times a day (TID) | SUBCUTANEOUS | Status: DC
  Administered 2018-12-19 – 2018-12-22 (×7): 8 [IU] via SUBCUTANEOUS

## 2018-12-19 MED ORDER — PHENOL 1.4 % MT LIQD
1.0000 | OROMUCOSAL | Status: DC | PRN
  Filled 2018-12-19: qty 177

## 2018-12-19 NOTE — Progress Notes (Signed)
Inpatient Diabetes Program Recommendations  AACE/ADA: New Consensus Statement on Inpatient Glycemic Control (2015)  Target Ranges:  Prepandial:   less than 140 mg/dL      Peak postprandial:   less than 180 mg/dL (1-2 hours)      Critically ill patients:  140 - 180 mg/dL   Lab Results  Component Value Date   GLUCAP 241 (H) 12/19/2018   HGBA1C 6.6 (H) 12/16/2018    Review of Glycemic Control   Diabetes history: DM 2 Outpatient Diabetes medications: Toujeo 50 units, Humalog 0-16 units tid Current orders for Inpatient glycemic control:  Lantus 50 units Novolog 0-20 units tid + hs Novolog 6 units tid meal coverage Tradjenta 5 mg Daily  A1c 6.8% on 12/13 Decadron 6 mg qhs BUN/Creat: 69/2.01 No PO supplementation at this time  Inpatient Diabetes Program Recommendations:    Noted all insulin increased yesterday. Glucose still in lower 200 range. Pt still getting decadron for a few more days. Renal function increasing.  Consider increasing Lantus to 56 units.  Thanks,  Tama Headings RN, MSN, BC-ADM Inpatient Diabetes Coordinator Team Pager [phone] (8a-5p)

## 2018-12-19 NOTE — Evaluation (Signed)
Occupational Therapy Evaluation Patient Details Name: Martha Carter MRN: [id number] DOB: [birth year] Today's Date: 12/19/2018    History of Present Illness 58 year old female with history of diastolic CHF, COPD and chronic RF on 3 to 5 L, CAD/CABG, paroxysmal A. fib on Eliquis, CKD-2, anemia of chronic disease, IDDM-2, PAD/right BKA, HTN, anxiety and depression are recent diagnosis was COVID-19 on 12/12/2018 in [place] and transferred from her nursing facility to [facility].  Pt admitted 12/15/18 for COPD exacerbation due to COVID-19 pneumonia/infection impression with chronic respiratory failure on 3 to 5 L at baseline.   Clinical Impression   Pt admitted with COVID. Pt currently with functional limitations due to the deficits listed below (see OT Problem List).  Pt will benefit from skilled OT to increase their safety and independence with ADL and functional mobility for ADL to facilitate discharge to venue listed below.      Follow Up Recommendations  SNF    Equipment Recommendations  None recommended by OT    Recommendations for Other Services       Precautions / Restrictions Precautions Precautions: Fall Precaution Comments: R BKA, 3-5L O2 baseline      Mobility Bed Mobility Overal bed mobility: Needs Assistance Bed Mobility: Supine to Sit;Sit to Supine     Supine to sit: Min assist Sit to supine: Min assist      Transfers Overall transfer level: Needs assistance                    Balance Overall balance assessment: Needs assistance Sitting-balance support: Bilateral upper extremity supported Sitting balance-Leahy Scale: Fair                                     ADL either performed or assessed with clinical judgement   ADL Overall ADL's : Needs assistance/impaired Eating/Feeding: Set up;Sitting   Grooming: Set up;Sitting   Upper Body Bathing: Set up;Sitting   Lower Body Bathing: Maximal assistance;Sitting/lateral  leans   Upper Body Dressing : Set up;Sitting   Lower Body Dressing: Sitting/lateral leans;Maximal assistance                 General ADL Comments: pt did sit EOB with OT.  Pt needed [facility] encouragement! Pt did well after sitting up.  Declined getting to chair     Vision Patient Visual Report: No change from baseline              Pertinent Vitals/Pain Pain Assessment: No/denies pain     Hand Dominance     Extremity/Trunk Assessment Upper Extremity Assessment Upper Extremity Assessment: Generalized weakness           Communication Communication Communication: No difficulties   Cognition Arousal/Alertness: Awake/alert Behavior During Therapy: WFL for tasks assessed/performed Overall Cognitive Status: Within Functional Limits for tasks assessed                                                Home Living Family/patient expects to be discharged to:: Skilled nursing facility                                        Prior Functioning/Environment Level of Independence: Needs  assistance  Gait / Transfers Assistance Needed: has been working on bed exercises in SNF, awaiting prosthesis for R BKA per pt              OT Problem List: Decreased strength;Decreased activity tolerance;Impaired balance (sitting and/or standing)      OT Treatment/Interventions: Self-care/ADL training;Therapeutic activities;Patient/family education;DME and/or AE instruction;Therapeutic exercise    OT Goals(Current goals can be found in the care plan section) Acute Rehab OT Goals Patient Stated Goal: back to nursing home OT Goal Formulation: With patient Time For Goal Achievement: 12/26/18 Potential to Achieve Goals: Fair  OT Frequency: Min 2X/week   Barriers to D/C: Decreased caregiver support             AM-PAC OT "6 Clicks" Daily Activity     Outcome Measure Help from another person eating meals?: A Little Help from another person taking  care of personal grooming?: A Little Help from another person toileting, which includes using toliet, bedpan, or urinal?: Total Help from another person bathing (including washing, rinsing, drying)?: A Lot Help from another person to put on and taking off regular upper body clothing?: A Little Help from another person to put on and taking off regular lower body clothing?: Total 6 Click Score: 13   End of Session Nurse Communication: Mobility status;Need for lift equipment  Activity Tolerance: Patient limited by fatigue Patient left: in bed;with call bell/phone within reach;with chair alarm set  OT Visit Diagnosis: Other abnormalities of gait and mobility (R26.89);Muscle weakness (generalized) (M62.81);History of falling (Z91.81)                Time: 0204-0230 OT Time Calculation (min): 26 min Charges:  OT General Charges $OT Visit: 1 Visit OT Evaluation $OT Eval Moderate Complexity: 1 Mod OT Treatments $Self Care/Home Management : 8-22 mins  [name], OT Acute Rehabilitation Services [phone] Office- [phone]     Mikko Lewellen, Edwena Felty D 12/19/2018, 6:00 PM

## 2018-12-19 NOTE — Progress Notes (Signed)
PROGRESS NOTE  Martha Carter ZOX:[id number] DOB: August 30, 1960   PCP: System, Pcp Not In  Patient is from: [facility].  Uses wheelchair at baseline.  DOA: 12/15/2018 LOS: 4  Brief Narrative / Interim history: 58 year old female with history of diastolic CHF, COPD and chronic RF on 3 to 5 L, CAD/CABG, paroxysmal A. fib on Eliquis, CKD-2, anemia of chronic disease, IDDM-2, PAD/right BKA, HTN, anxiety and depression are recent diagnosis was COVID-19 on 12/12/2018 presenting with shortness of breath, productive cough and lower extremity edema.  Test[facility] for Covid [facility] 12[facility]12[facility]. CXR suggestive for mild interstitial edema.  CTA negative for PE but concerning for CHF, hepatomegaly and splenomegaly.  Received IV Decadron 10 mg and albuterol inhalers and she was transferred [facility] his usual [facility].   Patient attributes her symptoms to lack of care and not getting [facility].  In ED, hemodynamically stable.  Saturating 100% on 5 L.  Hgb 7.1 (baseline 8.5-9.5, and 7.5 on 12/9).  Platelet 130 K.  Creatinine 1.65.  Total bili 1.1.  BNP 370.  CXR with hazy airspace opacities in left right midlungs.  Received IV Lasix and admitted for COVID-19 pneumonia, acute on chronic diastolic CHF and COPD, and AKI.  Started on Decadron and remdesivir.  Subjective: No major events overnight of this morning.  Reports improvement in her breathing but complains about headache and chills.  Headache is mainly frontal.  She rates her pain as 8/10.  Improved with pain medication last night.  She says it might get better with coffee.  Objective: Vitals:   12/18/18 2048 12/19/18 0421 12/19/18 0444 12/19/18 1308  BP: (!) 148/70  (!) 147/74 (!) 168/73  Pulse: 80  77 84  Resp: 18  18 18   Temp: 97.8 F (36.6 C)  97.8 F (36.6 C) 97.8 F (36.6 C)  TempSrc: Oral  Oral   SpO2: 98%  100% 94%  Weight:  87.1 kg    Height:        Intake/Output Summary (Last 24  hours) at 12/19/2018 1454 Last data filed at 12/19/2018 1400 Gross per 24 hour  Intake 1161.33 ml  Output --  Net 1161.33 ml   Filed Weights   12/17/18 0500 12/18/18 0442 12/19/18 0421  Weight: 86.7 kg 87.9 kg 87.1 kg    Examination:  GENERAL: No acute distress.  Appears well.  HEENT: MMM.  Vision and hearing grossly intact.  NECK: Supple.  No apparent JVD.  RESP:  No IWOB.  Diminished aeration and rhonchi bilaterally. CVS:  RRR. Heart sounds normal.  ABD/GI/GU: Bowel sounds present. Soft. Non tender.  MSK/EXT: Right BKA.  1 pitting edema in LLE. SKIN: no apparent skin lesion or wound NEURO: Awake, alert and oriented appropriately.  No apparent focal neuro deficit. PSYCH: Calm. Normal affect.  Procedures:  None  Assessment & Plan: COPD exacerbation due to COVID-19 pneumonia/infection impression with chronic respiratory failure on 3 to 5 L at baseline.  Exhibits cardinal symptoms of COPD exacerbation.  Saturating at 100% on 2 L. -Tested positive for COVID-19 on 12/12/18 at outside hospital. -Pro-Cal.  CRP uptrending.  D-dimer and ferritin improving. Recent Labs    12/17/18 0853 12/18/18 1031 12/19/18 0320  DDIMER 1.59* 1.33* 1.14*  FERRITIN 1,882* 1,782* 1,379*  CRP 0.5 1.0* 1.4*  -Remdesivir and Decadron 12/12>> -Continue inhalers, mucolytic's, antitussive, incentive spirometry, flutter valve, vitamin C and zinc. -Trend inflammatory markers-CRP uptrending. -Goal saturation 88 and 92%.  Weaned  to 1 L by [place].  May require more with exertion ambulation. -OOB/PT/OT  Acute on chronic diastolic CHF: Echo in 08/2018 with EF of 55 to 65%, G1 DD.  Presents with dyspnea and cough.  Has 1+ pitting edema on exam.  She denies orthopnea or PND.  BNP elevated could be falsely low given her weight.  On Bumex and Lasix at facility.  Reportedly has has not been getting her medication at any facility.  I&O incomplete but about 800 cc in canister with uncaptured void.  -Continue holding  diuretics-renal function uptrending. -Monitor fluid status, renal function and electrolytes. -Liberate fluid intake in the setting of worsening renal function.  AKI on CKD-3a/azotemia: Creatinine slightly up trended. -Creatinine 1.1-1.2 (baseline)> 1.65 (admit)> 1.57>> 1.89> 2.01 -Continue holding diuretics -Avoid nephrotoxic meds -Renal ultrasound -Continue monitoring  Anemia of chronic disease: CTA chest concerning for hepatomegaly and splenomegaly concerning for sequestration.  Reportedly has upcoming appointment with hematologist for Procrit.  H&H stable after transfusion. -Hgb 8-9 (baseline)> 7.5 (12/9)> 7.1 (admit)> 6.5>1u> 8.4> 9.6 (likely erroneous)> 8.3 -Monitor H&H -Continue p.o. iron and folic acid.  Hepatomegaly and splenomegaly: due to CHF?  Could be contributing to her anemia. -Manage CHF as above -Outpatient follow-up.  E. coli UTI: patient with dysuria: -Ceftriaxone 11/13 -Follow urine culture sensitivity -Diflucan x1 on 11/13  Paroxysmal A. Fib: On carvedilol and Eliquis at home. -Changed carvedilol to bisoprolol in the setting of COPD-beta-1 selective -Continue home Eliquis  Uncontrolled IDDM-2 with hyperglycemia: On Toujeo 50 units daily with sliding scale.  A1c 6.8%. Recent Labs    12/18/18 2052 12/19/18 0816 12/19/18 1224  GLUCAP 252* 241* 268*  -Lantus 50 units tonight.  Change to Levemir 30 units twice daily -Increase NovoLog from 6 units to 8 units AC -Continue Tradjenta and SSI-resistant -Continue statin and gabapentin.  CAD/CABG: No chest pain no anginal symptoms. -Continue bisoprolol to 10 mg -Continue home Imdur, hydralazine, statin and Eliquis.   PAD/right BKA -Continue statin.  Continue holding aspirin  Essential hypertension: BP mildly elevated. -Imdur, hydralazine and bisoprolol as above. -Increase clonidine to 0.2 mg twice daily.  Anxiety/depression/insomnia: somewhat anxious about going back to the new facility. -Continue  gabapentin, Zoloft, Atarax, melatonin  GERD -Continue Protonix and Carafate  Thrombocytopenia: Stable. -Continue monitoring  Nausea and emesis -Zofran and Phenergan as needed.  Headache: -As needed Tylenol. -Decadron could help               DVT prophylaxis: On Eliquis Code Status: Full code Family Communication: Patient and/or RN. Available if any question. Disposition Plan: Remains inpatient.  Final disposition SNF when medically ready. Anxious about going back to the new facility.  TOC aware. Consultants: None   Microbiology summarized: 12/9-COVID-19 positive at outside hospital. 12/12-urine culture grew E. coli.  Sch Meds:  Scheduled Meds: . sodium chloride   Intravenous Once  . apixaban  2.5 mg Oral BID  . atorvastatin  40 mg Oral q1800  . bisoprolol  10 mg Oral Daily  . cholecalciferol  2,000 Units Oral Daily  . dexamethasone (DECADRON) injection  6 mg Intravenous QHS  . folic acid  1 mg Oral Daily  . gabapentin  300 mg Oral TID  . hydrALAZINE  75 mg Oral Q8H  . insulin aspart  0-20 Units Subcutaneous TID WC  . insulin aspart  0-5 Units Subcutaneous QHS  . insulin aspart  6 Units Subcutaneous TID WC  . insulin glargine  50 Units Subcutaneous QHS  . ipratropium  2 puff Inhalation  Q4H  . iron polysaccharides  150 mg Oral Daily  . isosorbide mononitrate  120 mg Oral Daily  . linagliptin  5 mg Oral Daily  . mouth rinse  15 mL Mouth Rinse BID  . Melatonin  3 mg Oral QHS  . mometasone-formoterol  2 puff Inhalation BID  . multivitamin with minerals  1 tablet Per Tube Daily  . pantoprazole  40 mg Oral Daily  . sertraline  100 mg Oral Daily  . sodium chloride flush  3 mL Intravenous Q12H  . sucralfate  1 g Oral TID  . vitamin B-12  1,000 mcg Oral Daily  . vitamin C  500 mg Oral Daily  . zinc sulfate  220 mg Oral Daily   Continuous Infusions: . cefTRIAXone (ROCEPHIN)  IV 1 g (12/18/18 1750)  . remdesivir 100 mg in NS 100 mL 100 mg (12/19/18 1030)   PRN  Meds:.acetaminophen, albuterol, alum & mag hydroxide-simeth, chlorpheniramine-HYDROcodone, cloNIDine, guaiFENesin-dextromethorphan, hydrOXYzine, ondansetron (ZOFRAN) IV, polyvinyl alcohol, promethazine  Antimicrobials: Anti-infectives (From admission, onward)   Start     Dose/Rate Route Frequency Ordered Stop   12/17/18 1000  remdesivir 100 mg in sodium chloride 0.9 % 100 mL IVPB     100 mg 200 mL/hr over 30 Minutes Intravenous Daily 12/16/18 0019 12/21/18 0959   12/16/18 1600  cefTRIAXone (ROCEPHIN) 1 g in sodium chloride 0.9 % 100 mL IVPB     1 g 200 mL/hr over 30 Minutes Intravenous Every 24 hours 12/16/18 1440     12/16/18 1500  fluconazole (DIFLUCAN) tablet 150 mg     150 mg Oral  Once 12/16/18 1318 12/16/18 1800   12/16/18 0100  remdesivir 200 mg in sodium chloride 0.9% 250 mL IVPB     200 mg 580 mL/hr over 30 Minutes Intravenous Once 12/16/18 0019 12/16/18 0700       I have personally reviewed the following labs and images: CBC: Recent Labs  Lab 12/15/18 2037 12/16/18 0248 12/16/18 2116 12/17/18 0853 12/18/18 1031 12/19/18 0320  WBC 5.1 4.2  --  5.6 9.2 7.4  NEUTROABS 4.2 3.3  --   --   --   --   HGB 7.1* 6.5* 8.4* 9.5* 9.6* 8.3*  HCT 22.0* 20.9* 26.0* 28.6* 29.7* 26.5*  MCV 92.8 93.7  --  91.7 94.3 94.0  PLT 130* 115*  --  130* 127* 121*   BMP &GFR Recent Labs  Lab 12/15/18 2037 12/16/18 0248 12/17/18 0853 12/18/18 1031 12/19/18 0320  NA 140 139 135 135 136  K 3.8 3.7 4.9 4.5 5.0  CL 106 105 102 101 104  CO2 23 24 24 23 22   GLUCOSE 262* 229* 248* 221* 277*  BUN 58* 54* 62* 67* 69*  CREATININE 1.65* 1.57* 1.75* 1.89* 2.01*  CALCIUM 9.4 9.2 9.6 9.5 9.2  MG  --  1.9 2.0 2.0 2.2  PHOS  --  3.5 4.4 4.0 4.0   Estimated Creatinine Clearance: 30.9 mL/min (A) (by C-G formula based on SCr of 2.01 mg/dL (H)). Liver & Pancreas: Recent Labs  Lab 12/15/18 2037 12/16/18 0248 12/18/18 1031 12/19/18 0320  AST 15 14* 12* 14*  ALT 20 20 15 16   ALKPHOS 110 99 106  94  BILITOT 1.1 0.9 0.6 0.2*  PROT 6.3* 5.9* 5.7* 5.7*  ALBUMIN 3.1* 2.8* 2.8* 2.7*   No results for input(s): LIPASE, AMYLASE in the last 168 hours. No results for input(s): AMMONIA in the last 168 hours. Diabetic: Recent Labs    12/16/18 2116  HGBA1C 6.6*   Recent Labs  Lab 12/18/18 0847 12/18/18 1634 12/18/18 2052 12/19/18 0816 12/19/18 1224  GLUCAP 228* 267* 252* 241* 268*   Cardiac Enzymes: No results for input(s): CKTOTAL, CKMB, CKMBINDEX, TROPONINI in the last 168 hours. No results for input(s): PROBNP in the last 8760 hours. Coagulation Profile: No results for input(s): INR, PROTIME in the last 168 hours. Thyroid Function Tests: No results for input(s): TSH, T4TOTAL, FREET4, T3FREE, THYROIDAB in the last 72 hours. Lipid Profile: No results for input(s): CHOL, HDL, LDLCALC, TRIG, CHOLHDL, LDLDIRECT in the last 72 hours. Anemia Panel: Recent Labs    12/18/18 1031 12/19/18 0320  FERRITIN 1,782* 1,379*   Urine analysis:    Component Value Date/Time   COLORURINE YELLOW 12/16/2018 0013   APPEARANCEUR CLEAR 12/16/2018 0013   LABSPEC 1.011 12/16/2018 0013   PHURINE 6.0 12/16/2018 0013   GLUCOSEU 50 (A) 12/16/2018 0013   HGBUR NEGATIVE 12/16/2018 0013   BILIRUBINUR NEGATIVE 12/16/2018 0013   KETONESUR NEGATIVE 12/16/2018 0013   PROTEINUR 100 (A) 12/16/2018 0013   NITRITE NEGATIVE 12/16/2018 0013   LEUKOCYTESUR SMALL (A) 12/16/2018 0013   Sepsis Labs: Invalid input(s): PROCALCITONIN, [place]  Microbiology: Recent Results (from the past 240 hour(s))  Culture, Urine     Status: Abnormal (Preliminary result)   Collection Time: 12/16/18  7:35 AM   Specimen: Urine, Catheterized  Result Value Ref Range Status   Specimen Description   Final    URINE, CATHETERIZED Performed at [facility], [place] [address], [place], [place] [address]    Special Requests   Final    NONE Performed at [facility], [place] [address]  [place], [place] [address]    Culture (A)  Final    >=100,000 COLONIES/mL ESCHERICHIA COLI CULTURE REINCUBATED FOR BETTER GROWTH Performed at [facility], [place] [address] [place], [place] [address]    Report Status PENDING  Incomplete    Radiology Studies: No results found.  Akari Defelice T. [place]  If 7PM-7AM, please contact night-coverage [email or web address] Password [email or web address] 12/19/2018, 2:54 PM

## 2018-12-20 ENCOUNTER — Inpatient Hospital Stay (HOSPITAL_COMMUNITY): Payer: Medicare Other

## 2018-12-20 DIAGNOSIS — L8992 Pressure ulcer of unspecified site, stage 2: Secondary | ICD-10-CM

## 2018-12-20 DIAGNOSIS — L8990 Pressure ulcer of unspecified site, unspecified stage: Secondary | ICD-10-CM | POA: Insufficient documentation

## 2018-12-20 LAB — COMPREHENSIVE METABOLIC PANEL
ALT: 16 U/L (ref 0–44)
AST: 13 U/L — ABNORMAL LOW (ref 15–41)
Albumin: 2.6 g/dL — ABNORMAL LOW (ref 3.5–5.0)
Alkaline Phosphatase: 84 U/L (ref 38–126)
Anion gap: 11 (ref 5–15)
BUN: 67 mg/dL — ABNORMAL HIGH (ref 6–20)
CO2: 20 mmol/L — ABNORMAL LOW (ref 22–32)
Calcium: 9.1 mg/dL (ref 8.9–10.3)
Chloride: 105 mmol/L (ref 98–111)
Creatinine, Ser: 1.7 mg/dL — ABNORMAL HIGH (ref 0.44–1.00)
GFR calc Af Amer: 38 mL/min — ABNORMAL LOW (ref >60)
GFR calc non Af Amer: 32 mL/min — ABNORMAL LOW (ref >60)
Glucose, Bld: 134 mg/dL — ABNORMAL HIGH (ref 70–99)
Potassium: 4.8 mmol/L (ref 3.5–5.1)
Sodium: 136 mmol/L (ref 135–145)
Total Bilirubin: 0.3 mg/dL (ref 0.3–1.2)
Total Protein: 5.3 g/dL — ABNORMAL LOW (ref 6.5–8.1)

## 2018-12-20 LAB — CBC
HCT: 23.5 % — ABNORMAL LOW (ref 36.0–46.0)
Hemoglobin: 7.4 g/dL — ABNORMAL LOW (ref 12.0–15.0)
MCH: 29.6 pg (ref 26.0–34.0)
MCHC: 31.5 g/dL (ref 30.0–36.0)
MCV: 94 fL (ref 80.0–100.0)
Platelets: 115 10*3/uL — ABNORMAL LOW (ref 150–400)
RBC: 2.5 MIL/uL — ABNORMAL LOW (ref 3.87–5.11)
RDW: 17.5 % — ABNORMAL HIGH (ref 11.5–15.5)
WBC: 9.1 10*3/uL (ref 4.0–10.5)
nRBC: 0 % (ref 0.0–0.2)

## 2018-12-20 LAB — MAGNESIUM: Magnesium: 2.2 mg/dL (ref 1.7–2.4)

## 2018-12-20 LAB — GLUCOSE, CAPILLARY
Glucose-Capillary: 125 mg/dL — ABNORMAL HIGH (ref 70–99)
Glucose-Capillary: 157 mg/dL — ABNORMAL HIGH (ref 70–99)
Glucose-Capillary: 138 mg/dL — ABNORMAL HIGH (ref 70–99)
Glucose-Capillary: 107 mg/dL — ABNORMAL HIGH (ref 70–99)

## 2018-12-20 LAB — C-REACTIVE PROTEIN: CRP: 1.1 mg/dL — ABNORMAL HIGH (ref <1.0)

## 2018-12-20 LAB — BRAIN NATRIURETIC PEPTIDE: B Natriuretic Peptide: 338.5 pg/mL — ABNORMAL HIGH (ref 0.0–100.0)

## 2018-12-20 LAB — PHOSPHORUS: Phosphorus: 3 mg/dL (ref 2.5–4.6)

## 2018-12-20 LAB — D-DIMER, QUANTITATIVE: D-Dimer, Quant: 0.99 ug/mL-FEU — ABNORMAL HIGH (ref 0.00–0.50)

## 2018-12-20 LAB — FERRITIN: Ferritin: 1714 ng/mL — ABNORMAL HIGH (ref 11–307)

## 2018-12-20 MED ORDER — ALBUTEROL SULFATE HFA 108 (90 BASE) MCG/ACT IN AERS
2.0000 | INHALATION_SPRAY | Freq: Four times a day (QID) | RESPIRATORY_TRACT | Status: DC
  Administered 2018-12-20 (×4): 2 via RESPIRATORY_TRACT
  Filled 2018-12-20: qty 6.7

## 2018-12-20 MED ORDER — IPRATROPIUM BROMIDE HFA 17 MCG/ACT IN AERS
2.0000 | INHALATION_SPRAY | Freq: Four times a day (QID) | RESPIRATORY_TRACT | Status: DC
  Administered 2018-12-20 (×4): 2 via RESPIRATORY_TRACT
  Filled 2018-12-20: qty 12.9

## 2018-12-20 MED ORDER — AMOXICILLIN 250 MG PO CAPS
500.0000 mg | ORAL_CAPSULE | Freq: Three times a day (TID) | ORAL | Status: AC
  Administered 2018-12-20 – 2018-12-24 (×12): 500 mg via ORAL
  Filled 2018-12-20 (×11): qty 2

## 2018-12-20 MED ORDER — SACCHAROMYCES BOULARDII 250 MG PO CAPS
250.0000 mg | ORAL_CAPSULE | Freq: Two times a day (BID) | ORAL | Status: DC
  Administered 2018-12-20 – 2018-12-26 (×13): 250 mg via ORAL
  Filled 2018-12-20 (×12): qty 1

## 2018-12-20 MED ORDER — CEPHALEXIN 250 MG PO CAPS: 250.0000 mg | ORAL_CAPSULE | Freq: Three times a day (TID) | ORAL | Status: DC

## 2018-12-20 NOTE — Progress Notes (Addendum)
PROGRESS NOTE  Martha Carter UUV:[id number] DOB: 1960-10-14   PCP: System, Pcp Not In  Patient is from: [facility].  Uses wheelchair at baseline.  DOA: 12/15/2018 LOS: 5  Brief Narrative / Interim history: 58 year old female with history of diastolic CHF, COPD and chronic RF on 3 to 5 L, CAD/CABG, paroxysmal A. fib on Eliquis, CKD-2, anemia of chronic disease, IDDM-2, PAD/right BKA, HTN, anxiety and depression are recent diagnosis was COVID-19 on 12/12/2018 presenting with shortness of breath, productive cough and lower extremity edema.  Tested positive for Covid on 12/12/2018 to [facility] ED. CXR suggestive for mild interstitial edema.  CTA negative for PE but concerning for CHF, hepatomegaly and splenomegaly.  Received IV Decadron 10 mg and albuterol inhalers and she was transferred from his usual [facility] to [facility].   Patient attributes her symptoms to lack of care and not getting her medications at [facility] facility.  In ED, hemodynamically stable.  Saturating 100% on 5 L.  Hgb 7.1 (baseline 8.5-9.5, and 7.5 on 12/9).  Platelet 130 K.  Creatinine 1.65.  Total bili 1.1.  BNP 370.  CXR with hazy airspace opacities in left right midlungs.  Received IV Lasix and admitted for COVID-19 pneumonia, acute on chronic diastolic CHF and COPD, and AKI.  Started on Decadron and remdesivir.  Subjective: No major events overnight of this morning.  Reports some improvement in her breathing.  Felt heaviness in a chest that has improved this morning.  Continues to endorse intermittent cough.  No other complaints.  Objective: Vitals:   12/19/18 2053 12/20/18 0527 12/20/18 0600 12/20/18 1219  BP: (!) 143/70 133/69  (!) 159/73  Pulse: 84 82  84  Resp: Temp: 98.1 F (36.7 C) 97.6 F (36.4 C)  97.9 F (36.6 C)  TempSrc: Oral Oral  Oral  SpO2: 98% 99%  98%  Weight:   88.8 kg   Height:        Intake/Output Summary (Last 24 hours) at 12/20/2018 1459 Last data filed at 12/20/2018  0600 Gross per 24 hour  Intake 360 ml  Output 300 ml  Net 60 ml   Filed Weights   12/18/18 0442 12/19/18 0421 12/20/18 0600  Weight: 87.9 kg 87.1 kg 88.8 kg    Examination:  GENERAL: No apparent distress.  Nontoxic. HEENT: MMM.  Vision and hearing grossly intact.  NECK: Supple.  No apparent JVD.  RESP:  No IWOB. Good air movement bilaterally. CVS:  RRR. Heart sounds normal.  ABD/GI/GU: Bowel sounds present. Soft. Non tender.  MSK/EXT:  Moves extremities. No apparent deformity.  1+ pitting edema in LLE. SKIN: Stage II shallow ulcer over right BKA NEURO: Awake, alert and oriented appropriately.  No apparent focal neuro deficit. PSYCH: Calm. Normal affect.  Procedures:  None  Assessment & Plan: COPD exacerbation due to COVID-19 pneumonia/infection impression with chronic respiratory failure on 3 to 5 L at baseline. Tested positive for COVID-19 on 12/12/18 at outside hospital.  CXR consistent with multifocal infection.  Exhibits cardinal symptoms of COPD exacerbation.  Saturating in upper 90s on 1 L. -Pro-Cal.  Inflammatory markers improving.  Recent Labs    12/18/18 1031 12/19/18 0320 12/20/18 0305  DDIMER 1.33* 1.14* 0.99*  FERRITIN 1,782* 1,379* 1,714*  CRP 1.0* 1.4* 1.1*  -Remdesivir and Decadron 12/12>> -Continue inhalers, mucolytic's, antitussive, incentive spirometry, flutter valve, vitamin C and zinc. -Trend inflammatory markers -Goal saturation 88 and 92%.  -OOB/PT/OT-recommended SNF.  Acute on chronic diastolic CHF: Echo  in 08/2018 with EF of 55 to 65%, G1 DD.  Presents with dyspnea and cough.  Has 1+ pitting edema on exam.  She denies orthopnea or PND.  BNP elevated could be falsely low given her weight.  On Bumex and Lasix at facility.  Reportedly has has not been getting her medication at any facility.  I&O incomplete incomplete in her chart.  Weight is slightly up.  Reports some heaviness in her chest despite improvement in her dyspnea.  Renal function  improved. -Continue holding diuretics -Monitor fluid status, renal function and electrolytes. -Resume sodium and fluid restriction. -Check BNP.  AKI on CKD-3a/azotemia: Improving.  Renal ultrasound without significant finding. -Creatinine 1.1-1.2 (baseline)> 1.65 (admit)> 1.57>>> 2.01> 1.70 -Continue holding diuretics -Avoid nephrotoxic meds -Continue monitoring  Anemia of chronic disease: Concern for sequestration in the setting of hepatosplenomegaly.  Reportedly has upcoming appointment with hematologist for Procrit.  -Hgb 8-9 (baseline)> 7.5 (12/9)> 7.1 (admit)> 6.5>1u> 8.4 (exaggerated)>> 7.4 -Monitor H&H -Continue p.o. iron and folic acid.  Hepatomegaly and splenomegaly: due to CHF?  Could be contributing to her anemia. -Manage CHF as above -Outpatient follow-up.  E. coli and Enterococcus faecalis UTI: patient with dysuria: Urine culture with 30K Enterococcus faecalis and 100K pansensitive E. coli. -Ceftriaxone 11/13-11/16.  -Amoxicillin 11/17>>> for 4 more days -Diflucan x1 on 11/13  Paroxysmal A. Fib: On carvedilol and Eliquis at home. -Changed carvedilol to bisoprolol in the setting of COPD-beta-1 selective -Continue home Eliquis  Uncontrolled IDDM-2 with hyperglycemia: On Toujeo 50 units daily with sliding scale.  A1c 6.8%. Recent Labs    12/19/18 2126 12/20/18 0758 12/20/18 1215  GLUCAP 176* 125* 157*  -Levemir 30 units twice daily -NovoLog 8 units AC -Continue Tradjenta and SSI-resistant -Continue statin and gabapentin.  CAD/CABG: No chest pain no anginal symptoms.  Reports some heaviness in her chest despite improvement in her breathing. -Continue bisoprolol to 10 mg -Continue home Imdur, hydralazine, statin and Eliquis.   PAD/right BKA -Continue statin.  Continue holding aspirin  Essential hypertension: BP mildly elevated but improved. -Continue Imdur, hydralazine, bisoprolol and clonidine as above.  Anxiety/depression/insomnia: somewhat anxious about  going back to the new facility. -Continue gabapentin, Zoloft, Atarax, melatonin  GERD -Continue Protonix and Carafate  Thrombocytopenia: Stable. -Continue monitoring  Nausea and emesis -Zofran and Phenergan as needed.  Headache: -As needed Tylenol. -Decadron could help Pressure Injury 12/19/18 Leg Right;Mid;Anterior Stage II -  Partial thickness loss of dermis presenting as a shallow open ulcer with a red, pink wound bed without slough. (Active)  12/19/18 1940  Location: Leg  Location Orientation: Right;Mid;Anterior  Staging: Stage II -  Partial thickness loss of dermis presenting as a shallow open ulcer with a red, pink wound bed without slough.  Wound Description (Comments):   Present on Admission: Yes    Addendum in red as above.           DVT prophylaxis: On Eliquis Code Status: Full code Family Communication: Patient and/or RN. Available if any question. Disposition Plan: Remains inpatient.  Final disposition SNF when medically ready. Anxious about going back to the new facility.  TOC aware. Consultants: None   Microbiology summarized: 12/9-COVID-19 positive at outside hospital. 12/12-urine culture grew E. coli.  Sch Meds:  Scheduled Meds: . sodium chloride   Intravenous Once  . albuterol  2 puff Inhalation Q6H  . apixaban  2.5 mg Oral BID  . atorvastatin  40 mg Oral q1800  . bisoprolol  10 mg Oral Daily  . cholecalciferol  2,000  Units Oral Daily  . dexamethasone (DECADRON) injection  6 mg Intravenous QHS  . folic acid  1 mg Oral Daily  . gabapentin  300 mg Oral TID  . hydrALAZINE  75 mg Oral Q8H  . insulin aspart  0-20 Units Subcutaneous TID WC  . insulin aspart  0-5 Units Subcutaneous QHS  . insulin aspart  8 Units Subcutaneous TID WC  . insulin detemir  30 Units Subcutaneous BID  . ipratropium  2 puff Inhalation Q6H  . iron polysaccharides  150 mg Oral Daily  . isosorbide mononitrate  120 mg Oral Daily  . linagliptin  5 mg Oral Daily  . mouth  rinse  15 mL Mouth Rinse BID  . Melatonin  3 mg Oral QHS  . mometasone-formoterol  2 puff Inhalation BID  . multivitamin with minerals  1 tablet Per Tube Daily  . pantoprazole  40 mg Oral Daily  . sertraline  100 mg Oral Daily  . sodium chloride flush  3 mL Intravenous Q12H  . sucralfate  1 g Oral TID  . vitamin B-12  1,000 mcg Oral Daily  . vitamin C  500 mg Oral Daily  . zinc sulfate  220 mg Oral Daily   Continuous Infusions: . cefTRIAXone (ROCEPHIN)  IV 1 g (12/19/18 1754)   PRN Meds:.acetaminophen, albuterol, alum & mag hydroxide-simeth, chlorpheniramine-HYDROcodone, cloNIDine, guaiFENesin-dextromethorphan, hydrOXYzine, ondansetron (ZOFRAN) IV, phenol, polyvinyl alcohol, promethazine  Antimicrobials: Anti-infectives (From admission, onward)   Start     Dose/Rate Route Frequency Ordered Stop   12/17/18 1000  remdesivir 100 mg in sodium chloride 0.9 % 100 mL IVPB     100 mg 200 mL/hr over 30 Minutes Intravenous Daily 12/16/18 0019 12/20/18 0925   12/16/18 1600  cefTRIAXone (ROCEPHIN) 1 g in sodium chloride 0.9 % 100 mL IVPB     1 g 200 mL/hr over 30 Minutes Intravenous Every 24 hours 12/16/18 1440     12/16/18 1500  fluconazole (DIFLUCAN) tablet 150 mg     150 mg Oral  Once 12/16/18 1318 12/16/18 1800   12/16/18 0100  remdesivir 200 mg in sodium chloride 0.9% 250 mL IVPB     200 mg 580 mL/hr over 30 Minutes Intravenous Once 12/16/18 0019 12/16/18 0700       I have personally reviewed the following labs and images: CBC: Recent Labs  Lab 12/15/18 2037 12/16/18 0248 12/16/18 2116 12/17/18 0853 12/18/18 1031 12/19/18 0320 12/20/18 0305  WBC 5.1 4.2  --  5.6 9.2 7.4 9.1  NEUTROABS 4.2 3.3  --   --   --   --   --   HGB 7.1* 6.5* 8.4* 9.5* 9.6* 8.3* 7.4*  HCT 22.0* 20.9* 26.0* 28.6* 29.7* 26.5* 23.5*  MCV 92.8 93.7  --  91.7 94.3 94.0 94.0  PLT 130* 115*  --  130* 127* 121* 115*   BMP &GFR Recent Labs  Lab 12/16/18 0248 12/17/18 0853 12/18/18 1031 12/19/18 0320  12/20/18 0305  NA 139 135 135 136 136  K 3.7 4.9 4.5 5.0 4.8  CL 105 102 101 104 105  CO2 20*  GLUCOSE 229* 248* 221* 277* 134*  BUN 54* 62* 67* 69* 67*  CREATININE 1.57* 1.75* 1.89* 2.01* 1.70*  CALCIUM 9.2 9.6 9.5 9.2 9.1  MG 1.9 2.0 2.0 2.2 2.2  PHOS 3.5 4.4 4.0 4.0 3.0   Estimated Creatinine Clearance: 36.9 mL/min (A) (by C-G formula based on SCr of 1.7 mg/dL (H)). Liver & Pancreas: Recent Labs  Lab 12/15/18 2037 12/16/18 0248 12/18/18 1031 12/19/18 0320 12/20/18 0305  AST 15 14* 12* 14* 13*  ALT 20 20 15 16 16   ALKPHOS 110 99 106 94 84  BILITOT 1.1 0.9 0.6 0.2* 0.3  PROT 6.3* 5.9* 5.7* 5.7* 5.3*  ALBUMIN 3.1* 2.8* 2.8* 2.7* 2.6*   No results for input(s): LIPASE, AMYLASE in the last 168 hours. No results for input(s): AMMONIA in the last 168 hours. Diabetic: No results for input(s): HGBA1C in the last 72 hours. Recent Labs  Lab 12/19/18 1224 12/19/18 1721 12/19/18 2126 12/20/18 0758 12/20/18 1215  GLUCAP 268* 238* 176* 125* 157*   Cardiac Enzymes: No results for input(s): CKTOTAL, CKMB, CKMBINDEX, TROPONINI in the last 168 hours. No results for input(s): PROBNP in the last 8760 hours. Coagulation Profile: No results for input(s): INR, PROTIME in the last 168 hours. Thyroid Function Tests: No results for input(s): TSH, T4TOTAL, FREET4, T3FREE, THYROIDAB in the last 72 hours. Lipid Profile: No results for input(s): CHOL, HDL, LDLCALC, TRIG, CHOLHDL, LDLDIRECT in the last 72 hours. Anemia Panel: Recent Labs    12/19/18 0320 12/20/18 0305  FERRITIN 1,379* 1,714*   Urine analysis:    Component Value Date/Time   COLORURINE YELLOW 12/16/2018 0013   APPEARANCEUR CLEAR 12/16/2018 0013   LABSPEC 1.011 12/16/2018 0013   PHURINE 6.0 12/16/2018 0013   GLUCOSEU 50 (A) 12/16/2018 0013   HGBUR NEGATIVE 12/16/2018 0013   BILIRUBINUR NEGATIVE 12/16/2018 0013   KETONESUR NEGATIVE 12/16/2018 0013   PROTEINUR 100 (A) 12/16/2018 0013   NITRITE NEGATIVE  12/16/2018 0013   LEUKOCYTESUR SMALL (A) 12/16/2018 0013   Sepsis Labs: Invalid input(s): PROCALCITONIN, LACTICIDVEN  Microbiology: Recent Results (from the past 240 hour(s))  Culture, Urine     Status: Abnormal (Preliminary result)   Collection Time: 12/16/18  7:35 AM   Specimen: Urine, Catheterized  Result Value Ref Range Status   Specimen Description   Final    URINE, CATHETERIZED Performed at [facility], [address], [place], [place]    Special Requests   Final    NONE Performed at [facility], [address], [place], [place]    Culture (A)  Final    >=100,000 COLONIES/mL ESCHERICHIA COLI 30,000 COLONIES/mL ENTEROCOCCUS FAECALIS SUSCEPTIBILITIES TO FOLLOW Performed at [facility], [address] [place], [place]    Report Status PENDING  Incomplete   Organism ID, Bacteria ESCHERICHIA COLI (A)  Final      Susceptibility   Escherichia coli - MIC*    AMPICILLIN <=2 SENSITIVE Sensitive     CEFAZOLIN <=4 SENSITIVE Sensitive     CEFTRIAXONE <=1 SENSITIVE Sensitive     CIPROFLOXACIN <=0.25 SENSITIVE Sensitive     GENTAMICIN <=1 SENSITIVE Sensitive     IMIPENEM <=0.25 SENSITIVE Sensitive     NITROFURANTOIN <=16 SENSITIVE Sensitive     TRIMETH/SULFA <=20 SENSITIVE Sensitive     AMPICILLIN/SULBACTAM <=2 SENSITIVE Sensitive     PIP/TAZO <=4 SENSITIVE Sensitive     * >=100,000 COLONIES/mL ESCHERICHIA COLI    Radiology Studies: 26203 Renal  Result Date: 12/19/2018 CLINICAL DATA:  Acute kidney injury EXAM: RENAL / URINARY TRACT ULTRASOUND COMPLETE COMPARISON:  CT abdomen pelvis 07/26/2017 FINDINGS: Right Kidney: Renal measurements: 12.3 x 5.4 x 5.7 cm = volume: 216.4 mL . Echogenicity within normal limits. No mass or hydronephrosis visualized. Left Kidney: Renal measurements: 11.5 x 5.5 x 4.6 cm = volume: 153 mL. Echogenicity within normal limits. No  mass or hydronephrosis visualized. Bladder: Appears  normal for degree of bladder distention. Bilateral bladder jets are identified. Other: Incidental note made of an enlarged spleen measuring 15.9 cm in length with an estimated splenic volume of 686 mL. IMPRESSION: Unremarkable renal ultrasound. Incidentally noted splenomegaly. Electronically Signed   By: Lovena Le M.D.   On: 12/19/2018 17:45   CXR portable  Result Date: 12/20/2018 CLINICAL DATA:  Shortness of breath, weakness, COVID-19 positive EXAM: PORTABLE CHEST 1 VIEW COMPARISON:  Portable exam 1041 hours compared to 12/15/2018 FINDINGS: Kyphotic positioning with rotation to the RIGHT. Enlargement of cardiac silhouette post median sternotomy. Stable mediastinal contours. BILATERAL patchy pulmonary infiltrates which could represent multifocal pneumonia or pulmonary edema. No pleural effusion or pneumothorax. Bones demineralized. IMPRESSION: BILATERAL pulmonary infiltrates question multifocal pneumonia versus edema. Electronically Signed   By: Lavonia Dana M.D.   On: 12/20/2018 11:00    Karlin Heilman T. [place]  If 7PM-7AM, please contact night-coverage [email or web address] Password [email or web address] 12/20/2018, 2:59 PM

## 2018-12-20 NOTE — Consult Note (Signed)
[facility] Nurse wound follow up Wound type:Consult requested for stage 2 pressure injury.  Reviewed progress notes and nursing flow sheet in the EMR.  Bedside nurse noted that right BKA stump had a stage 2 pressure injury which was present on admission; 3X.5cm.  This can have a foam dressing applied to protect and promote healing, according to the standing wound care order set for stage 2 pressure injuries. Please re-consult if further assistance is needed.  Thank-you,  Julien Girt MSN, [place], [place], [place], [place]

## 2018-12-21 DIAGNOSIS — B9620 Unspecified Escherichia coli [E. coli] as the cause of diseases classified elsewhere: Secondary | ICD-10-CM

## 2018-12-21 LAB — CBC WITH DIFFERENTIAL/PLATELET
Abs Immature Granulocytes: 0.13 10*3/uL — ABNORMAL HIGH (ref 0.00–0.07)
Basophils Absolute: 0 10*3/uL (ref 0.0–0.1)
Basophils Relative: 0 %
Eosinophils Absolute: 0 10*3/uL (ref 0.0–0.5)
Eosinophils Relative: 0 %
HCT: 25.6 % — ABNORMAL LOW (ref 36.0–46.0)
Hemoglobin: 8.2 g/dL — ABNORMAL LOW (ref 12.0–15.0)
Immature Granulocytes: 1 %
Lymphocytes Relative: 3 %
Lymphs Abs: 0.3 10*3/uL — ABNORMAL LOW (ref 0.7–4.0)
MCH: 30.3 pg (ref 26.0–34.0)
MCHC: 32 g/dL (ref 30.0–36.0)
MCV: 94.5 fL (ref 80.0–100.0)
Monocytes Absolute: 0.2 10*3/uL (ref 0.1–1.0)
Monocytes Relative: 2 %
Neutro Abs: 9.3 10*3/uL — ABNORMAL HIGH (ref 1.7–7.7)
Neutrophils Relative %: 94 %
Platelets: 129 10*3/uL — ABNORMAL LOW (ref 150–400)
RBC: 2.71 MIL/uL — ABNORMAL LOW (ref 3.87–5.11)
RDW: 17.6 % — ABNORMAL HIGH (ref 11.5–15.5)
WBC: 9.9 10*3/uL (ref 4.0–10.5)
nRBC: 0 % (ref 0.0–0.2)

## 2018-12-21 LAB — COMPREHENSIVE METABOLIC PANEL
ALT: 18 U/L (ref 0–44)
AST: 15 U/L (ref 15–41)
Albumin: 2.8 g/dL — ABNORMAL LOW (ref 3.5–5.0)
Alkaline Phosphatase: 91 U/L (ref 38–126)
Anion gap: 8 (ref 5–15)
BUN: 65 mg/dL — ABNORMAL HIGH (ref 6–20)
CO2: 22 mmol/L (ref 22–32)
Calcium: 9.5 mg/dL (ref 8.9–10.3)
Chloride: 108 mmol/L (ref 98–111)
Creatinine, Ser: 1.62 mg/dL — ABNORMAL HIGH (ref 0.44–1.00)
GFR calc Af Amer: 40 mL/min — ABNORMAL LOW (ref >60)
GFR calc non Af Amer: 34 mL/min — ABNORMAL LOW (ref >60)
Glucose, Bld: 114 mg/dL — ABNORMAL HIGH (ref 70–99)
Potassium: 5 mmol/L (ref 3.5–5.1)
Sodium: 138 mmol/L (ref 135–145)
Total Bilirubin: 0.6 mg/dL (ref 0.3–1.2)
Total Protein: 5.5 g/dL — ABNORMAL LOW (ref 6.5–8.1)

## 2018-12-21 LAB — URINE CULTURE: Culture: >=100000 — AB

## 2018-12-21 LAB — GLUCOSE, CAPILLARY
Glucose-Capillary: 111 mg/dL — ABNORMAL HIGH (ref 70–99)
Glucose-Capillary: 131 mg/dL — ABNORMAL HIGH (ref 70–99)
Glucose-Capillary: 94 mg/dL (ref 70–99)
Glucose-Capillary: 97 mg/dL (ref 70–99)

## 2018-12-21 LAB — FERRITIN: Ferritin: 2022 ng/mL — ABNORMAL HIGH (ref 11–307)

## 2018-12-21 LAB — LACTATE DEHYDROGENASE: LDH: 154 U/L (ref 98–192)

## 2018-12-21 LAB — D-DIMER, QUANTITATIVE: D-Dimer, Quant: 0.84 ug/mL-FEU — ABNORMAL HIGH (ref 0.00–0.50)

## 2018-12-21 LAB — C-REACTIVE PROTEIN: CRP: 1.2 mg/dL — ABNORMAL HIGH (ref <1.0)

## 2018-12-21 MED ORDER — IPRATROPIUM BROMIDE HFA 17 MCG/ACT IN AERS
2.0000 | INHALATION_SPRAY | Freq: Three times a day (TID) | RESPIRATORY_TRACT | Status: DC
  Administered 2018-12-21 – 2018-12-26 (×17): 2 via RESPIRATORY_TRACT
  Filled 2018-12-21: qty 12.9

## 2018-12-21 MED ORDER — ALBUTEROL SULFATE HFA 108 (90 BASE) MCG/ACT IN AERS
2.0000 | INHALATION_SPRAY | Freq: Three times a day (TID) | RESPIRATORY_TRACT | Status: DC
  Administered 2018-12-21 – 2018-12-26 (×17): 2 via RESPIRATORY_TRACT

## 2018-12-21 NOTE — Progress Notes (Signed)
PT Cancellation Note  Patient Details Name: Martha Carter MRN: [id number] DOB: 09-25-1960   Cancelled Treatment:    Reason Eval/Treat Not Completed: Patient declined, no reason specified   Pt adamantly declined PT. Tried to encourage but she stated "I said I'm don't feel like it today, I'm just depressed, I want to go back to [place], I don't want to go back to that nursing home I was at."  Will f/u as able.  [name], PT Acute Rehab Services Pager [phone] [facility] [phone]    Karlton Lemon 12/21/2018, 3:05 PM

## 2018-12-21 NOTE — NC FL2 (Signed)
[place] MEDICAID FL2 LEVEL OF CARE SCREENING TOOL     IDENTIFICATION  Patient Name: Martha BustleGlenda W Carter Birthdate: Jan 06, 1960 Sex: female Admission Date (Current Location): 12/15/2018  [facility] [place]   Facility and Address:  [facility],  [address], [place] [id number]      Provider Number: [id number]  Attending Physician Name and Address:  Almon HerculesGonfa, Taye T, MD  Relative Name and Phone Number:       Current Level of Care: Hospital Recommended Level of Care: Skilled Nursing Facility Prior Approval Number:    Date Approved/Denied:   PASRR Number: [id number]  Discharge Plan: SNF    Current Diagnoses: Patient Active Problem List   Diagnosis Date Noted  . Pressure injury of skin 12/20/2018  . Acute respiratory disease due to COVID-19 virus 12/15/2018  . Peripheral arterial disease (HCC)   . Anemia of chronic disease   . COPD (chronic obstructive pulmonary disease) (HCC)   . Chronic respiratory failure with hypoxia (HCC)   . Acute on chronic heart failure with preserved ejection fraction (HFpEF) (HCC)   . Depression with anxiety   . Insulin dependent type 2 diabetes mellitus (HCC)   . Hypertension associated with diabetes (HCC)   . Acute on chronic respiratory failure with hypoxia (HCC)   . Lobar pneumonia, unspecified organism (HCC)   . Coronary artery disease   . AF (paroxysmal atrial fibrillation) (HCC)     Orientation RESPIRATION BLADDER Height & Weight     Self, Time, Situation, Place  Normal External catheter, Continent Weight: 88.4 kg Height:  5\' 2"  (157.5 cm)  BEHAVIORAL SYMPTOMS/MOOD NEUROLOGICAL BOWEL NUTRITION STATUS      Continent Diet(Carb Modified)  AMBULATORY STATUS COMMUNICATION OF NEEDS Skin   Limited Assist   Other (Comment)                       Personal Care Assistance Level of Assistance  Bathing, Feeding, Dressing Bathing Assistance: Limited assistance Feeding assistance: Independent Dressing  Assistance: Limited assistance     Functional Limitations Info  Sight, Hearing, Speech Sight Info: Impaired Hearing Info: Adequate Speech Info: Adequate    SPECIAL CARE FACTORS FREQUENCY  PT (By licensed PT), OT (By licensed OT)     PT Frequency: Eval and Treat OT Frequency: Eval and Treat            Contractures      Additional Factors Info  Code Status, Allergies Code Status Info: FULL Allergies Info: Exenatide, Celecoxib, Sulfamethoxazole, Ace Inhibitors, Ceftriaxone, Clindamycin, Doxycycline, Hydromorphone, Latex, Metoclopramide, Morphine, Tape           Current Medications (12/21/2018):  This is the current hospital active medication list Current Facility-Administered Medications  Medication Dose Route Frequency Provider Last Rate Last Admin  . 0.9 %  sodium chloride infusion (Manually program via Guardrails IV Fluids)   Intravenous Once Omelia BlackwaterKyere, Belinda K, NP      . acetaminophen (TYLENOL) tablet 650 mg  650 mg Oral Q6H PRN Almon HerculesGonfa, Taye T, MD   650 mg at 12/19/18 1753  . albuterol (VENTOLIN HFA) 108 (90 Base) MCG/ACT inhaler 2 puff  2 puff Inhalation Q4H PRN Gonfa, Taye T, MD      . albuterol (VENTOLIN HFA) 108 (90 Base) MCG/ACT inhaler 2 puff  2 puff Inhalation TID Almon HerculesGonfa, Taye T, MD   2 puff at 12/21/18 0849  . alum & mag hydroxide-simeth (MAALOX/MYLANTA) 200-200-20 MG/5ML suspension 30 mL  30 mL Oral Q8H  PRN Mercy Riding, MD      . amoxicillin (AMOXIL) capsule 500 mg  500 mg Oral Q8H Gonfa, Taye T, MD   500 mg at 12/21/18 0500  . apixaban (ELIQUIS) tablet 2.5 mg  2.5 mg Oral BID Wendee Beavers T, MD   2.5 mg at 12/21/18 0848  . atorvastatin (LIPITOR) tablet 40 mg  40 mg Oral q1800 Lenore Cordia, MD   40 mg at 12/20/18 2123  . bisoprolol (ZEBETA) tablet 10 mg  10 mg Oral Daily Wendee Beavers T, MD   10 mg at 12/21/18 0847  . chlorpheniramine-HYDROcodone (TUSSIONEX) 10-8 MG/5ML suspension 5 mL  5 mL Oral Q12H PRN Zada Finders R, MD   5 mL at 12/21/18 0900  .  cholecalciferol (VITAMIN D) tablet 2,000 Units  2,000 Units Oral Daily Mercy Riding, MD   2,000 Units at 12/21/18 0846  . cloNIDine (CATAPRES) tablet 0.2 mg  0.2 mg Oral Q12H PRN Wendee Beavers T, MD      . dexamethasone (DECADRON) injection 6 mg  6 mg Intravenous QHS Lenore Cordia, MD   6 mg at 12/20/18 2123  . folic acid (FOLVITE) tablet 1 mg  1 mg Oral Daily Wendee Beavers T, MD   1 mg at 12/21/18 0847  . gabapentin (NEURONTIN) capsule 300 mg  300 mg Oral TID Lenore Cordia, MD   300 mg at 12/21/18 0847  . guaiFENesin-dextromethorphan (ROBITUSSIN DM) 100-10 MG/5ML syrup 10 mL  10 mL Oral Q4H PRN Lenore Cordia, MD   10 mL at 12/21/18 0457  . hydrALAZINE (APRESOLINE) tablet 75 mg  75 mg Oral Q8H Patel, Vishal R, MD   75 mg at 12/21/18 0846  . hydrOXYzine (ATARAX/VISTARIL) tablet 25 mg  25 mg Oral TID PRN Lenore Cordia, MD   25 mg at 12/20/18 2123  . insulin aspart (novoLOG) injection 0-20 Units  0-20 Units Subcutaneous TID WC Mercy Riding, MD   3 Units at 12/20/18 1725  . insulin aspart (novoLOG) injection 0-5 Units  0-5 Units Subcutaneous QHS Mercy Riding, MD   3 Units at 12/18/18 2106  . insulin aspart (novoLOG) injection 8 Units  8 Units Subcutaneous TID WC Mercy Riding, MD   8 Units at 12/21/18 0845  . insulin detemir (LEVEMIR) injection 30 Units  30 Units Subcutaneous BID Wendee Beavers T, MD   30 Units at 12/21/18 0900  . ipratropium (ATROVENT HFA) inhaler 2 puff  2 puff Inhalation TID Mercy Riding, MD   2 puff at 12/21/18 0849  . iron polysaccharides (NIFEREX) capsule 150 mg  150 mg Oral Daily Wendee Beavers T, MD   150 mg at 12/21/18 0848  . isosorbide mononitrate (IMDUR) 24 hr tablet 120 mg  120 mg Oral Daily Zada Finders R, MD   120 mg at 12/21/18 0848  . linagliptin (TRADJENTA) tablet 5 mg  5 mg Oral Daily Wendee Beavers T, MD   5 mg at 12/21/18 0846  . MEDLINE mouth rinse  15 mL Mouth Rinse BID Wendee Beavers T, MD   15 mL at 12/21/18 0850  . Melatonin TABS 3 mg  3 mg Oral QHS Wendee Beavers T,  MD   3 mg at 12/20/18 2123  . mometasone-formoterol (DULERA) 200-5 MCG/ACT inhaler 2 puff  2 puff Inhalation BID Lenore Cordia, MD   2 puff at 12/21/18 0849  . multivitamin with minerals tablet 1 tablet  1 tablet Per Tube Daily Mercy Riding, MD  1 tablet at 12/21/18 0848  . ondansetron (ZOFRAN) injection 4 mg  4 mg Intravenous Q8H PRN Omelia Blackwater K, NP   4 mg at 12/16/18 2117  . pantoprazole (PROTONIX) EC tablet 40 mg  40 mg Oral Daily Candelaria Stagers T, MD   40 mg at 12/21/18 0847  . phenol (CHLORASEPTIC) mouth spray 1 spray  1 spray Mouth/Throat PRN Gonfa, Taye T, MD      . polyvinyl alcohol (LIQUIFILM TEARS) 1.4 % ophthalmic solution 2 drop  2 drop Both Eyes QID PRN Gonfa, Taye T, MD      . promethazine (PHENERGAN) injection 25 mg  25 mg Intravenous Q8H PRN Candelaria Stagers T, MD   25 mg at 12/16/18 1257  . saccharomyces boulardii (FLORASTOR) capsule 250 mg  250 mg Oral BID Candelaria Stagers T, MD   250 mg at 12/21/18 0846  . sertraline (ZOLOFT) tablet 100 mg  100 mg Oral Daily Darreld Mclean R, MD   100 mg at 12/21/18 0846  . sodium chloride flush (NS) 0.9 % injection 3 mL  3 mL Intravenous Q12H Charlsie Quest, MD   3 mL at 12/20/18 2123  . sucralfate (CARAFATE) tablet 1 g  1 g Oral TID Candelaria Stagers T, MD   1 g at 12/21/18 0847  . vitamin B-12 (CYANOCOBALAMIN) tablet 1,000 mcg  1,000 mcg Oral Daily Candelaria Stagers T, MD   1,000 mcg at 12/21/18 0848  . vitamin C (ASCORBIC ACID) tablet 500 mg  500 mg Oral Daily Darreld Mclean R, MD   500 mg at 12/21/18 0848  . zinc sulfate capsule 220 mg  220 mg Oral Daily Charlsie Quest, MD   220 mg at 12/21/18 6659     Discharge Medications: Please see discharge summary for a list of discharge medications.  Relevant Imaging Results:  Relevant Lab Results:   Additional Information SS [id number]  Geni Bers, RN

## 2018-12-21 NOTE — Care Management Important Message (Signed)
Important Message  Patient Details IM Letter given to Gabriel Earing RN Case Manager to present to the Patient Name: Martha Carter MRN: [id number] Date of Birth: 1960-07-29   Medicare Important Message Given:  Yes     Kerin Salen 12/21/2018, 10:11 AM

## 2018-12-21 NOTE — TOC Progression Note (Signed)
Transition of Care [facility]) - Progression Note    Patient Details  Name: Martha Carter MRN: [id number] Date of Birth: Apr 30, 1960  Transition of Care [facility]) CM/SW Contact  Purcell Mouton, RN Phone Number: 12/21/2018, 12:08 PM  Clinical Narrative:    Spoke with pt concerning discharge back to SNF. Pt is asking to go to another SNF. Will fax pt's information out to SNF's taking COVID pt's.   Expected Discharge Plan: [place] Barriers to Discharge: No Barriers Identified  Expected Discharge Plan and Services Expected Discharge Plan: [place] arrangements for the past 2 months: [place]                                       Social Determinants of Health (SDOH) Interventions    Readmission Risk Interventions No flowsheet data found.

## 2018-12-21 NOTE — Progress Notes (Signed)
PROGRESS NOTE  Martha Carter:[id number] DOB: [birth year]   PCP: System, Pcp Not In  Patient is from: [facility].  Uses wheelchair at baseline.  DOA: 12/15/2018 LOS: 6  Brief Narrative / Interim history: 58 year old female with history of diastolic CHF, COPD and chronic RF on 3 to 5 L, CAD/CABG, paroxysmal A. fib on Eliquis, CKD-2, anemia of chronic disease, IDDM-2, PAD/right BKA, HTN, anxiety and depression are recent diagnosis was COVID-19 on 12/12/2018 presenting with shortness of breath, productive cough and lower extremity edema.  Tested positive for Covid on 12/12/2018 to [facility] ED. CXR suggestive for mild interstitial edema.  CTA negative for PE but concerning for CHF, hepatomegaly and splenomegaly.  Received IV Decadron 10 mg and albuterol inhalers and she was transferred from his usual [facility] to [facility].   Patient attributes her symptoms to lack of care and not getting her medications at [facility] facility.  In ED, hemodynamically stable.  Saturating 100% on 5 L.  Hgb 7.1 (baseline 8.5-9.5, and 7.5 on 12/9).  Platelet 130 K.  Creatinine 1.65.  Total bili 1.1.  BNP 370.  CXR with hazy airspace opacities in left right midlungs.  Received IV Lasix and admitted for COVID-19 pneumonia, acute on chronic diastolic CHF and COPD, and AKI.  Started on Decadron and remdesivir.  Subjective: No major events overnight of this morning.  Continues to endorse shortness of breath.  Denies chest pain, GI or UTI symptoms.  Anxious about going back to their facility.  She is asking if she could be retested for COVID-19 to go to a different facility.  Objective: Vitals:   12/20/18 2020 12/21/18 0346 12/21/18 0500 12/21/18 1405  BP: 138/76 138/69  (!) 156/69  Pulse: 89 82  83  Resp: Temp: 98.7 F (37.1 C) 97.8 F (36.6 C)  98.6 F (37 C)  TempSrc: Oral Oral    SpO2: 98% 93%  92%  Weight:   88.4 kg   Height:        Intake/Output Summary (Last 24 hours) at 12/21/2018  1422 Last data filed at 12/21/2018 0432 Gross per 24 hour  Intake 240 ml  Output 950 ml  Net -710 ml   Filed Weights   12/19/18 0421 12/20/18 0600 12/21/18 0500  Weight: 87.1 kg 88.8 kg 88.4 kg    Examination:  GENERAL: No acute distress.  Appears well.  HEENT: MMM.  Vision and hearing grossly intact.  NECK: Supple.  No apparent JVD.  RESP:  No IWOB.  Diminished aeration bilaterally.  CVS:  RRR. Heart sounds normal.  ABD/GI/GU: Bowel sounds present. Soft. Non tender.  MSK/EXT:  Moves extremities.  Right BKA.  1+ pitting edema. SKIN: Small stage II ulcer over right BKA stump NEURO: Awake, alert and oriented appropriately.  No apparent focal neuro deficit. PSYCH: Calm. Normal affect.   Procedures:  None  Assessment & Plan: COPD exacerbation due to COVID-19 pneumonia/infection impression with chronic respiratory failure on 3 to 5 L at baseline. Tested positive for COVID-19 on 12/12/18 at outside hospital.  CXR consistent with multifocal infection.  Exhibits cardinal symptoms of COPD exacerbation.  Saturating in upper 90s on 1 L. -Pro-Cal.  Inflammatory markers relatively stable. Recent Labs    12/19/18 0320 12/20/18 0305 12/21/18 0256  DDIMER 1.14* 0.99* 0.84*  FERRITIN 1,379* 1,714* 2,022*  LDH  --   --  154  CRP 1.4* 1.1* 1.2*  -Remdesivir and Decadron 12/13>> -Continue inhalers, mucolytic's, antitussive, incentive spirometry, flutter  valve, vitamin C and zinc. -Trend inflammatory markers -Goal saturation 88 and 92%.  -OOB/PT/OT-recommended SNF.  Acute on chronic diastolic CHF: Echo in 08/2018 with EF of 55 to 65%, G1 DD.  On Bumex and Lasix at facility. BNP elevated could be falsely low given her weight.  Reportedly has has not been getting her medication at any facility.  I&O incomplete incomplete about 1 L overnight..  Weight is slightly up.  Still with some shortness of breath and 1+ pitting edema.  BNP renal function improved. -Continue holding diuretics -Monitor  fluid status, renal function and electrolytes. -Sodium and fluid restriction.  AKI on CKD-3a/azotemia: Improving.  Renal ultrasound without significant finding. -Creatinine 1.1-1.2 (baseline)> 1.65 (admit)> 1.57>>> 2.01> 1.70> 1.62 -Continue holding diuretics -Avoid nephrotoxic meds -Continue monitoring  Anemia of chronic disease: Concern for sequestration in the setting of hepatosplenomegaly.  Reportedly has upcoming appointment with hematologist for Procrit.  Denies melena or hematochezia. -Hgb 8-9 (baseline)> 7.5 (12/9)> 7.1 (admit)> 6.5>1u> 8.4 (exaggerated)>> 7.4> 8.2 -Monitor H&H -Continue p.o. iron and folic acid.  Hepatomegaly and splenomegaly: due to CHF?  Could be contributing to her anemia. -Manage CHF as above -Outpatient follow-up.  E. coli and Enterococcus faecalis UTI: POA. Urine culture with 30K Enterococcus faecalis and 100K pansensitive E. coli. -Ceftriaxone 11/13-11/16. Amoxicillin 11/17>>> for 4 more days -Diflucan x1 on 11/13  Paroxysmal A. Fib: On carvedilol and Eliquis at home. -Changed carvedilol to bisoprolol in the setting of COPD-beta-1 selective -Continue home Eliquis  Uncontrolled IDDM-2 with hyperglycemia: On Toujeo 50 units daily with sliding scale.  A1c 6.8%. Recent Labs    12/20/18 2018 12/21/18 0803 12/21/18 1153  GLUCAP 107* 111* 131*  -Levemir 30 units twice daily -NovoLog 8 units AC -Continue Tradjenta and SSI-resistant -Continue statin and gabapentin.  CAD/CABG: No chest pain no anginal symptoms.  Reports some heaviness in her chest despite improvement in her breathing. -Continue bisoprolol to 10 mg -Continue home Imdur, hydralazine, statin and Eliquis.   PAD/right BKA -Continue statin.  Continue holding aspirin  Essential hypertension: BP mildly elevated but improved. -Continue Imdur, hydralazine, bisoprolol and clonidine as above.  Anxiety/depression/insomnia: somewhat anxious about going back to the new facility. -Continue  gabapentin, Zoloft, Atarax, melatonin  GERD -Continue Protonix and Carafate  Thrombocytopenia: Stable. -Continue monitoring  Nausea and emesis: Resolved. -Antiemetics as needed  Headache: -As needed Tylenol. -Decadron could help  Pressure injury of right stump: POA. Pressure Injury 12/19/18 Leg Right;Mid;Anterior Stage II -  Partial thickness loss of dermis presenting as a shallow open ulcer with a red, pink wound bed without slough. (Active)  12/19/18 1940  Location: Leg  Location Orientation: Right;Mid;Anterior  Staging: Stage II -  Partial thickness loss of dermis presenting as a shallow open ulcer with a red, pink wound bed without slough.  Wound Description (Comments):   Present on Admission: Yes           DVT prophylaxis: On Eliquis Code Status: Full code Family Communication: Patient and/or RN. Available if any question. Disposition Plan: Remains inpatient.  Final disposition SNF when medically ready. Anxious about going back to the new facility.  TOC aware. Consultants: None   Microbiology summarized: 12/9-COVID-19 positive at outside hospital. 12/12-urine culture grew E. coli.  Sch Meds:  Scheduled Meds: . sodium chloride   Intravenous Once  . albuterol  2 puff Inhalation TID  . amoxicillin  500 mg Oral Q8H  . apixaban  2.5 mg Oral BID  . atorvastatin  40 mg Oral q1800  . bisoprolol  10 mg Oral Daily  . cholecalciferol  2,000 Units Oral Daily  . dexamethasone (DECADRON) injection  6 mg Intravenous QHS  . folic acid  1 mg Oral Daily  . gabapentin  300 mg Oral TID  . hydrALAZINE  75 mg Oral Q8H  . insulin aspart  0-20 Units Subcutaneous TID WC  . insulin aspart  0-5 Units Subcutaneous QHS  . insulin aspart  8 Units Subcutaneous TID WC  . insulin detemir  30 Units Subcutaneous BID  . ipratropium  2 puff Inhalation TID  . iron polysaccharides  150 mg Oral Daily  . isosorbide mononitrate  120 mg Oral Daily  . linagliptin  5 mg Oral Daily  . mouth rinse   15 mL Mouth Rinse BID  . Melatonin  3 mg Oral QHS  . mometasone-formoterol  2 puff Inhalation BID  . multivitamin with minerals  1 tablet Per Tube Daily  . pantoprazole  40 mg Oral Daily  . saccharomyces boulardii  250 mg Oral BID  . sertraline  100 mg Oral Daily  . sodium chloride flush  3 mL Intravenous Q12H  . sucralfate  1 g Oral TID  . vitamin B-12  1,000 mcg Oral Daily  . vitamin C  500 mg Oral Daily  . zinc sulfate  220 mg Oral Daily   Continuous Infusions:  PRN Meds:.acetaminophen, albuterol, alum & mag hydroxide-simeth, chlorpheniramine-HYDROcodone, cloNIDine, guaiFENesin-dextromethorphan, hydrOXYzine, ondansetron (ZOFRAN) IV, phenol, polyvinyl alcohol, promethazine  Antimicrobials: Anti-infectives (From admission, onward)   Start     Dose/Rate Route Frequency Ordered Stop   12/20/18 1545  amoxicillin (AMOXIL) capsule 500 mg     500 mg Oral Every 8 hours 12/20/18 1535 12/24/18 1359   12/20/18 1515  cephALEXin (KEFLEX) capsule 250 mg  Status:  Discontinued     250 mg Oral Every 8 hours 12/20/18 1512 12/20/18 1535   12/17/18 1000  remdesivir 100 mg in sodium chloride 0.9 % 100 mL IVPB     100 mg 200 mL/hr over 30 Minutes Intravenous Daily 12/16/18 0019 12/20/18 0925   12/16/18 1600  cefTRIAXone (ROCEPHIN) 1 g in sodium chloride 0.9 % 100 mL IVPB  Status:  Discontinued     1 g 200 mL/hr over 30 Minutes Intravenous Every 24 hours 12/16/18 1440 12/20/18 1512   12/16/18 1500  fluconazole (DIFLUCAN) tablet 150 mg     150 mg Oral  Once 12/16/18 1318 12/16/18 1800   12/16/18 0100  remdesivir 200 mg in sodium chloride 0.9% 250 mL IVPB     200 mg 580 mL/hr over 30 Minutes Intravenous Once 12/16/18 0019 12/16/18 0700       I have personally reviewed the following labs and images: CBC: Recent Labs  Lab 12/15/18 2037 12/16/18 0248 12/17/18 0853 12/18/18 1031 12/19/18 0320 12/20/18 0305 12/21/18 0256  WBC 5.1 4.2 5.6 9.2 7.4 9.1 9.9  NEUTROABS 4.2 3.3  --   --   --   --   9.3*  HGB 7.1* 6.5* 9.5* 9.6* 8.3* 7.4* 8.2*  HCT 22.0* 20.9* 28.6* 29.7* 26.5* 23.5* 25.6*  MCV 92.8 93.7 91.7 94.3 94.0 94.0 94.5  PLT 130* 115* 130* 127* 121* 115* 129*   BMP &GFR Recent Labs  Lab 12/16/18 0248 12/17/18 0853 12/18/18 1031 12/19/18 0320 12/20/18 0305 12/21/18 0256  NA 139 135 135 136 136 138  K 3.7 4.9 4.5 5.0 4.8 5.0  CL 105 102 101 104 105 108  CO2 24 24 23 22  20* 22  GLUCOSE 229*  248* 221* 277* 134* 114*  BUN 54* 62* 67* 69* 67* 65*  CREATININE 1.57* 1.75* 1.89* 2.01* 1.70* 1.62*  CALCIUM 9.2 9.6 9.5 9.2 9.1 9.5  MG 1.9 2.0 2.0 2.2 2.2  --   PHOS 3.5 4.4 4.0 4.0 3.0  --    Estimated Creatinine Clearance: 38.6 mL/min (A) (by C-G formula based on SCr of 1.62 mg/dL (H)). Liver & Pancreas: Recent Labs  Lab 12/16/18 0248 12/18/18 1031 12/19/18 0320 12/20/18 0305 12/21/18 0256  AST 14* 12* 14* 13* 15  ALT 20 15 16 16 18   ALKPHOS 99 106 94 84 91  BILITOT 0.9 0.6 0.2* 0.3 0.6  PROT 5.9* 5.7* 5.7* 5.3* 5.5*  ALBUMIN 2.8* 2.8* 2.7* 2.6* 2.8*   No results for input(s): LIPASE, AMYLASE in the last 168 hours. No results for input(s): AMMONIA in the last 168 hours. Diabetic: No results for input(s): HGBA1C in the last 72 hours. Recent Labs  Lab 12/20/18 1215 12/20/18 1711 12/20/18 2018 12/21/18 0803 12/21/18 1153  GLUCAP 157* 138* 107* 111* 131*   Cardiac Enzymes: No results for input(s): CKTOTAL, CKMB, CKMBINDEX, TROPONINI in the last 168 hours. No results for input(s): PROBNP in the last 8760 hours. Coagulation Profile: No results for input(s): INR, PROTIME in the last 168 hours. Thyroid Function Tests: No results for input(s): TSH, T4TOTAL, FREET4, T3FREE, THYROIDAB in the last 72 hours. Lipid Profile: No results for input(s): CHOL, HDL, LDLCALC, TRIG, CHOLHDL, LDLDIRECT in the last 72 hours. Anemia Panel: Recent Labs    12/20/18 0305 12/21/18 0256  FERRITIN 1,714* 2,022*   Urine analysis:    Component Value Date/Time   COLORURINE  YELLOW 12/16/2018 0013   APPEARANCEUR CLEAR 12/16/2018 0013   LABSPEC 1.011 12/16/2018 0013   PHURINE 6.0 12/16/2018 0013   GLUCOSEU 50 (A) 12/16/2018 0013   HGBUR NEGATIVE 12/16/2018 0013   BILIRUBINUR NEGATIVE 12/16/2018 0013   KETONESUR NEGATIVE 12/16/2018 0013   PROTEINUR 100 (A) 12/16/2018 0013   NITRITE NEGATIVE 12/16/2018 0013   LEUKOCYTESUR SMALL (A) 12/16/2018 0013   Sepsis Labs: Invalid input(s): PROCALCITONIN, [place]  Microbiology: Recent Results (from the past 240 hour(s))  Culture, Urine     Status: Abnormal   Collection Time: 12/16/18  7:35 AM   Specimen: Urine, Catheterized  Result Value Ref Range Status   Specimen Description   Final    URINE, CATHETERIZED Performed at [facility], [place] [address] [place], [place] [address]    Special Requests   Final    NONE Performed at [facility], [place] [address] [place], [place] [address]    Culture (A)  Final    >=100,000 COLONIES/mL ESCHERICHIA COLI 30,000 COLONIES/mL VANCOMYCIN RESISTANT ENTEROCOCCUS ISOLATED    Report Status 12/21/2018 FINAL  Final   Organism ID, Bacteria ESCHERICHIA COLI (A)  Final   Organism ID, Bacteria VANCOMYCIN RESISTANT ENTEROCOCCUS ISOLATED (A)  Final      Susceptibility   Escherichia coli - MIC*    AMPICILLIN <=2 SENSITIVE Sensitive     CEFAZOLIN <=4 SENSITIVE Sensitive     CEFTRIAXONE <=1 SENSITIVE Sensitive     CIPROFLOXACIN <=0.25 SENSITIVE Sensitive     GENTAMICIN <=1 SENSITIVE Sensitive     IMIPENEM <=0.25 SENSITIVE Sensitive     NITROFURANTOIN <=16 SENSITIVE Sensitive     TRIMETH/SULFA <=20 SENSITIVE Sensitive     AMPICILLIN/SULBACTAM <=2 SENSITIVE Sensitive     PIP/TAZO <=4 SENSITIVE Sensitive     * >=100,000 COLONIES/mL ESCHERICHIA COLI   Vancomycin resistant  enterococcus isolated - MIC*    AMPICILLIN <=2 SENSITIVE Sensitive     NITROFURANTOIN <=16 SENSITIVE Sensitive     VANCOMYCIN >=32 RESISTANT Resistant     LINEZOLID 2  SENSITIVE Sensitive     * 30,000 COLONIES/mL VANCOMYCIN RESISTANT ENTEROCOCCUS ISOLATED    Radiology Studies: No results found.  Marna Weniger T. Agustina Witzke [facility]  If 7PM-7AM, please contact night-coverage [email or web address] Password [email or web address] 12/21/2018, 2:22 PM

## 2018-12-22 DIAGNOSIS — D696 Thrombocytopenia, unspecified: Secondary | ICD-10-CM

## 2018-12-22 DIAGNOSIS — K5900 Constipation, unspecified: Secondary | ICD-10-CM

## 2018-12-22 LAB — GLUCOSE, CAPILLARY
Glucose-Capillary: 105 mg/dL — ABNORMAL HIGH (ref 70–99)
Glucose-Capillary: 138 mg/dL — ABNORMAL HIGH (ref 70–99)
Glucose-Capillary: 122 mg/dL — ABNORMAL HIGH (ref 70–99)
Glucose-Capillary: 115 mg/dL — ABNORMAL HIGH (ref 70–99)

## 2018-12-22 LAB — CBC WITH DIFFERENTIAL/PLATELET
Abs Immature Granulocytes: 0.09 10*3/uL — ABNORMAL HIGH (ref 0.00–0.07)
Basophils Absolute: 0 10*3/uL (ref 0.0–0.1)
Basophils Relative: 0 %
Eosinophils Absolute: 0 10*3/uL (ref 0.0–0.5)
Eosinophils Relative: 0 %
HCT: 24 % — ABNORMAL LOW (ref 36.0–46.0)
Hemoglobin: 7.5 g/dL — ABNORMAL LOW (ref 12.0–15.0)
Immature Granulocytes: 1 %
Lymphocytes Relative: 6 %
Lymphs Abs: 0.4 10*3/uL — ABNORMAL LOW (ref 0.7–4.0)
MCH: 29.4 pg (ref 26.0–34.0)
MCHC: 31.3 g/dL (ref 30.0–36.0)
MCV: 94.1 fL (ref 80.0–100.0)
Monocytes Absolute: 0.2 10*3/uL (ref 0.1–1.0)
Monocytes Relative: 3 %
Neutro Abs: 6.3 10*3/uL (ref 1.7–7.7)
Neutrophils Relative %: 90 %
Platelets: 111 10*3/uL — ABNORMAL LOW (ref 150–400)
RBC: 2.55 MIL/uL — ABNORMAL LOW (ref 3.87–5.11)
RDW: 17.2 % — ABNORMAL HIGH (ref 11.5–15.5)
WBC: 7 10*3/uL (ref 4.0–10.5)
nRBC: 0 % (ref 0.0–0.2)

## 2018-12-22 LAB — COMPREHENSIVE METABOLIC PANEL
ALT: 18 U/L (ref 0–44)
AST: 13 U/L — ABNORMAL LOW (ref 15–41)
Albumin: 2.7 g/dL — ABNORMAL LOW (ref 3.5–5.0)
Alkaline Phosphatase: 85 U/L (ref 38–126)
Anion gap: 7 (ref 5–15)
BUN: 63 mg/dL — ABNORMAL HIGH (ref 6–20)
CO2: 23 mmol/L (ref 22–32)
Calcium: 9.5 mg/dL (ref 8.9–10.3)
Chloride: 108 mmol/L (ref 98–111)
Creatinine, Ser: 1.3 mg/dL — ABNORMAL HIGH (ref 0.44–1.00)
GFR calc Af Amer: 52 mL/min — ABNORMAL LOW (ref >60)
GFR calc non Af Amer: 45 mL/min — ABNORMAL LOW (ref >60)
Glucose, Bld: 117 mg/dL — ABNORMAL HIGH (ref 70–99)
Potassium: 4.9 mmol/L (ref 3.5–5.1)
Sodium: 138 mmol/L (ref 135–145)
Total Bilirubin: 0.4 mg/dL (ref 0.3–1.2)
Total Protein: 5.2 g/dL — ABNORMAL LOW (ref 6.5–8.1)

## 2018-12-22 LAB — C-REACTIVE PROTEIN: CRP: 0.6 mg/dL (ref <1.0)

## 2018-12-22 LAB — MAGNESIUM: Magnesium: 2.4 mg/dL (ref 1.7–2.4)

## 2018-12-22 LAB — D-DIMER, QUANTITATIVE: D-Dimer, Quant: 0.68 ug/mL-FEU — ABNORMAL HIGH (ref 0.00–0.50)

## 2018-12-22 LAB — LACTATE DEHYDROGENASE: LDH: 138 U/L (ref 98–192)

## 2018-12-22 MED ORDER — FUROSEMIDE 10 MG/ML IJ SOLN
20.0000 mg | Freq: Two times a day (BID) | INTRAMUSCULAR | Status: DC
  Administered 2018-12-23 – 2018-12-25 (×5): 20 mg via INTRAVENOUS
  Filled 2018-12-22 (×5): qty 2

## 2018-12-22 MED ORDER — INSULIN ASPART 100 UNIT/ML ~~LOC~~ SOLN
6.0000 [IU] | Freq: Three times a day (TID) | SUBCUTANEOUS | Status: DC
  Administered 2018-12-22 – 2018-12-26 (×11): 6 [IU] via SUBCUTANEOUS

## 2018-12-22 MED ORDER — SENNOSIDES-DOCUSATE SODIUM 8.6-50 MG PO TABS
1.0000 | ORAL_TABLET | Freq: Two times a day (BID) | ORAL | Status: DC | PRN
  Administered 2018-12-22 – 2018-12-24 (×2): 1 via ORAL
  Filled 2018-12-22 (×2): qty 1

## 2018-12-22 MED ORDER — POLYETHYLENE GLYCOL 3350 17 G PO PACK: 17.0000 g | PACK | Freq: Two times a day (BID) | ORAL | Status: DC | PRN

## 2018-12-22 MED ORDER — FUROSEMIDE 10 MG/ML IJ SOLN
20.0000 mg | Freq: Once | INTRAMUSCULAR | Status: AC
  Administered 2018-12-22: 20 mg via INTRAVENOUS
  Filled 2018-12-22: qty 2

## 2018-12-22 MED ORDER — INSULIN DETEMIR 100 UNIT/ML ~~LOC~~ SOLN
25.0000 [IU] | Freq: Two times a day (BID) | SUBCUTANEOUS | Status: DC
  Administered 2018-12-22 – 2018-12-26 (×8): 25 [IU] via SUBCUTANEOUS
  Filled 2018-12-22 (×10): qty 0.25

## 2018-12-22 NOTE — Progress Notes (Signed)
Patient is a hard stick and refused AM labs after 2 failed attempts.

## 2018-12-22 NOTE — Progress Notes (Signed)
PROGRESS NOTE  Martha Carter UMP:[id number] DOB: 1960-12-15   PCP: System, Pcp Not In  Patient is from: [facility].  Uses wheelchair at baseline.  DOA: 12/15/2018 LOS: 7  Brief Narrative / Interim history: 58 year old female with history of diastolic CHF, COPD and chronic RF on 3 to 5 L, CAD/CABG, paroxysmal A. fib on Eliquis, CKD-2, anemia of chronic disease, IDDM-2, PAD/right BKA, HTN, anxiety and depression are recent diagnosis was COVID-19 on 12/12/2018 presenting with shortness of breath, productive cough and lower extremity edema.  Tested positive for Covid on 12/12/2018 to [facility] ED. CXR suggestive for mild interstitial edema.  CTA negative for PE but concerning for CHF, hepatomegaly and splenomegaly.  Received IV Decadron 10 mg and albuterol inhalers and she was transferred from his usual [facility] to [facility].   Patient attributes her symptoms to lack of care and not getting her medications at [facility] facility.  In ED, hemodynamically stable.  Saturating 100% on 5 L.  Hgb 7.1 (baseline 8.5-9.5, and 7.5 on 12/9).  Platelet 130 K.  Creatinine 1.65.  Total bili 1.1.  BNP 370.  CXR with hazy airspace opacities in left right midlungs.  Received IV Lasix and admitted for COVID-19 pneumonia, acute on chronic diastolic CHF and COPD, and AKI.  Started on Decadron and remdesivir.  Subjective: No major events overnight of this morning.  Reports shortness of breath, orthopnea and some heaviness in her chest.  Denies nausea, vomiting or abdominal pain.  She states that she has not a bowel movement in a week.  She said this is not unusual for her.  Objective: Vitals:   12/21/18 1405 12/21/18 2059 12/22/18 0527 12/22/18 0814  BP: (!) 156/69 (!) 158/79 (!) 152/70 (!) 176/90  Pulse: 83 88 78 81  Resp: 20 18 20 20   Temp: 98.6 F (37 C) 98 F (36.7 C) 98 F (36.7 C)   TempSrc:  Oral Oral   SpO2: 92% 94% 92% 98%  Weight:   88.4 kg   Height:        Intake/Output Summary (Last 24  hours) at 12/22/2018 1405 Last data filed at 12/22/2018 0800 Gross per 24 hour  Intake -  Output 1500 ml  Net -1500 ml   Filed Weights   12/20/18 0600 12/21/18 0500 12/22/18 0527  Weight: 88.8 kg 88.4 kg 88.4 kg    Examination:  GENERAL: No apparent distress.  Nontoxic. HEENT: MMM.  Vision and hearing grossly intact.  NECK: Supple.  No apparent JVD.  RESP:  No IWOB.  Diminished aeration bilaterally. CVS:  RRR. Heart sounds normal.  ABD/GI/GU: Bowel sounds present. Soft. Non tender.  MSK/EXT: Right BKA.  1+ pitting edema in LLE. SKIN: Small stage II ulcer over right BKA stump. NEURO: Awake, alert and oriented appropriately.  No apparent focal neuro deficit. PSYCH: Calm. Normal affect.   Procedures:  None  Assessment & Plan: COPD exacerbation due to COVID-19 pneumonia/infection impression with chronic respiratory failure on 3 to 5 L at baseline. Tested positive for COVID-19 on 12/12/18 at outside hospital.  CXR consistent with multifocal infection.  Exhibits cardinal symptoms of COPD exacerbation.  Saturating in mid 90s on room air. -Pro-Cal.  Negative.  Inflammatory markers downtrending. Recent Labs    12/20/18 0305 12/21/18 0256 12/22/18 0744  DDIMER 0.99* 0.84* 0.68*  FERRITIN 1,714* 2,022*  --   LDH  --  154 138  CRP 1.1* 1.2* 0.6  -Remdesivir 12/13-12/17.  Decadron 12/13>> -Continue inhalers, mucolytic's, antitussive, incentive spirometry, flutter  valve, vitamin C and zinc. -Trend inflammatory markers -Goal saturation 88 and 92%.  -OOB/PT/OT-recommended SNF.  Acute on chronic diastolic CHF: Echo in 08/2018 with EF of 55 to 65%, G1 DD.  On Bumex and Lasix at facility. BNP elevated could be falsely low given her weight.  Reportedly has has not been getting her medication at any facility.  Had 1.2 L UOP charted overnight.  Weight is relatively stable.  Continues to endorse dyspnea, orthopnea and some chest heaviness.  Renal function improved. -Start IV Lasix at 20 mg  twice daily -Monitor fluid status, renal function and electrolytes. -Sodium and fluid restriction.  AKI on CKD-3a/azotemia: Improving.  Renal ultrasound without significant finding. -Creatinine 1.1-1.2 (baseline)> 1.65 (admit)> 1.57>>> 2.01>>1.62> 1.3 -Continue monitoring  Anemia of chronic disease: Concern for sequestration in the setting of hepatosplenomegaly.  Reportedly has upcoming appointment with hematologist for Procrit.  Denies melena or hematochezia. -Hgb 8-9 (baseline)> 7.5 (12/9)> 7.1 (admit)> 6.5>1u> 8.4 (exaggerated)>> 7.4> 7.5 -Monitor H&H -Continue p.o. iron and folic acid.  Thrombocytopenia: Suspect sequestration from hepatomegaly and splenomegaly.  Stable. -Continue monitoring  Hepatomegaly and splenomegaly: due to CHF?  Could be contributing to her anemia. -Manage CHF as above -Outpatient follow-up.  E. coli and Enterococcus faecalis UTI: POA. Urine culture with 30K Enterococcus faecalis and 100K pansensitive E. coli. -Ceftriaxone 11/13-11/16. Amoxicillin 11/17>>> for 4 more days -Diflucan x1 on 11/13  Paroxysmal A. Fib: On carvedilol and Eliquis at home. -Changed carvedilol to bisoprolol in the setting of COPD-beta-1 selective -Continue home Eliquis  Uncontrolled IDDM-2 with hyperglycemia: On Toujeo 50 units daily with sliding scale.  A1c 6.8%. Recent Labs    12/21/18 2055 12/22/18 0758 12/22/18 1244  GLUCAP 97 105* 138*  -Reduce Levemir from 30 to 25 units twice daily, NovoLog from 8 to 6 units AC -Continue Tradjenta and SSI-resistant -Continue statin and gabapentin.  CAD/CABG: continues to endorse some heaviness in the chest likely from CHF.  EKG without acute ischemic finding. -Continue bisoprolol,  home Imdur, hydralazine, statin and Eliquis.  -Manage CHF as above.  PAD/right BKA -Continue statin.  Continue holding aspirin  Essential hypertension: BP mildly elevated but improved. -Continue Imdur, hydralazine, bisoprolol and clonidine as above.   Anxiety/depression/insomnia: somewhat anxious about going back to the new facility. -Continue gabapentin, Zoloft, Atarax, melatonin  GERD -Continue Protonix and Carafate  Thrombocytopenia: Stable. -Continue monitoring  Nausea and emesis: Resolved. -Antiemetics as needed  Headache: Resolved. -As needed Tylenol. -Decadron could help  Constipation: Chronic issue.  Has history of gastroparesis.  Reports intolerance to Reglan. -Senokot-S and MiraLAX  Pressure injury of right stump: POA. Pressure Injury 12/19/18 Leg Right;Mid;Anterior Stage II -  Partial thickness loss of dermis presenting as a shallow open ulcer with a red, pink wound bed without slough. (Active)  12/19/18 1940  Location: Leg  Location Orientation: Right;Mid;Anterior  Staging: Stage II -  Partial thickness loss of dermis presenting as a shallow open ulcer with a red, pink wound bed without slough.  Wound Description (Comments):   Present on Admission: Yes           DVT prophylaxis: On Eliquis Code Status: Full code Family Communication: Patient and/or RN. Available if any question. Disposition Plan: Remains inpatient.  Final disposition SNF when medically ready. Anxious about going back to the new facility.  TOC aware. Consultants: None   Microbiology summarized: 12/9-COVID-19 positive at outside hospital. 12/12-urine culture grew E. coli.  Sch Meds:  Scheduled Meds: . sodium chloride   Intravenous Once  . albuterol  2 puff Inhalation TID  . amoxicillin  500 mg Oral Q8H  . apixaban  2.5 mg Oral BID  . atorvastatin  40 mg Oral q1800  . bisoprolol  10 mg Oral Daily  . cholecalciferol  2,000 Units Oral Daily  . dexamethasone (DECADRON) injection  6 mg Intravenous QHS  . folic acid  1 mg Oral Daily  . furosemide  20 mg Intravenous Once  . [START ON 12/23/2018] furosemide  20 mg Intravenous BID  . gabapentin  300 mg Oral TID  . hydrALAZINE  75 mg Oral Q8H  . insulin aspart  0-20 Units Subcutaneous  TID WC  . insulin aspart  0-5 Units Subcutaneous QHS  . insulin aspart  8 Units Subcutaneous TID WC  . insulin detemir  30 Units Subcutaneous BID  . ipratropium  2 puff Inhalation TID  . iron polysaccharides  150 mg Oral Daily  . isosorbide mononitrate  120 mg Oral Daily  . linagliptin  5 mg Oral Daily  . mouth rinse  15 mL Mouth Rinse BID  . Melatonin  3 mg Oral QHS  . mometasone-formoterol  2 puff Inhalation BID  . multivitamin with minerals  1 tablet Per Tube Daily  . pantoprazole  40 mg Oral Daily  . saccharomyces boulardii  250 mg Oral BID  . sertraline  100 mg Oral Daily  . sodium chloride flush  3 mL Intravenous Q12H  . sucralfate  1 g Oral TID  . vitamin B-12  1,000 mcg Oral Daily  . vitamin C  500 mg Oral Daily  . zinc sulfate  220 mg Oral Daily   Continuous Infusions:  PRN Meds:.acetaminophen, albuterol, alum & mag hydroxide-simeth, chlorpheniramine-HYDROcodone, cloNIDine, guaiFENesin-dextromethorphan, hydrOXYzine, ondansetron (ZOFRAN) IV, phenol, polyethylene glycol, polyvinyl alcohol, promethazine, senna-docusate  Antimicrobials: Anti-infectives (From admission, onward)   Start     Dose/Rate Route Frequency Ordered Stop   12/20/18 1545  amoxicillin (AMOXIL) capsule 500 mg     500 mg Oral Every 8 hours 12/20/18 1535 12/24/18 1359   12/20/18 1515  cephALEXin (KEFLEX) capsule 250 mg  Status:  Discontinued     250 mg Oral Every 8 hours 12/20/18 1512 12/20/18 1535   12/17/18 1000  remdesivir 100 mg in sodium chloride 0.9 % 100 mL IVPB     100 mg 200 mL/hr over 30 Minutes Intravenous Daily 12/16/18 0019 12/20/18 0925   12/16/18 1600  cefTRIAXone (ROCEPHIN) 1 g in sodium chloride 0.9 % 100 mL IVPB  Status:  Discontinued     1 g 200 mL/hr over 30 Minutes Intravenous Every 24 hours 12/16/18 1440 12/20/18 1512   12/16/18 1500  fluconazole (DIFLUCAN) tablet 150 mg     150 mg Oral  Once 12/16/18 1318 12/16/18 1800   12/16/18 0100  remdesivir 200 mg in sodium chloride 0.9% 250  mL IVPB     200 mg 580 mL/hr over 30 Minutes Intravenous Once 12/16/18 0019 12/16/18 0700       I have personally reviewed the following labs and images: CBC: Recent Labs  Lab 12/15/18 2037 12/16/18 0248 12/16/18 2116 12/18/18 1031 12/19/18 0320 12/20/18 0305 12/21/18 0256 12/22/18 0744  WBC 5.1 4.2   < > 9.2 7.4 9.1 9.9 7.0  NEUTROABS 4.2 3.3  --   --   --   --  9.3* 6.3  HGB 7.1* 6.5*  --  9.6* 8.3* 7.4* 8.2* 7.5*  HCT 22.0* 20.9*  --  29.7* 26.5* 23.5* 25.6* 24.0*  MCV 92.8 93.7   < >  94.3 94.0 94.0 94.5 94.1  PLT 130* 115*   < > 127* 121* 115* 129* 111*   < > = values in this interval not displayed.   BMP &GFR Recent Labs  Lab 12/16/18 0248 12/17/18 0853 12/18/18 1031 12/19/18 0320 12/20/18 0305 12/21/18 0256 12/22/18 0744  NA 139 135 135 136 136 138 138  K 3.7 4.9 4.5 5.0 4.8 5.0 4.9  CL 105 102 101 104 105 108 108  CO2 24 24 23 22  20* 22 23  GLUCOSE 229* 248* 221* 277* 134* 114* 117*  BUN 54* 62* 67* 69* 67* 65* 63*  CREATININE 1.57* 1.75* 1.89* 2.01* 1.70* 1.62* 1.30*  CALCIUM 9.2 9.6 9.5 9.2 9.1 9.5 9.5  MG 1.9 2.0 2.0 2.2 2.2  --  2.4  PHOS 3.5 4.4 4.0 4.0 3.0  --   --    Estimated Creatinine Clearance: 48.1 mL/min (A) (by C-G formula based on SCr of 1.3 mg/dL (H)). Liver & Pancreas: Recent Labs  Lab 12/18/18 1031 12/19/18 0320 12/20/18 0305 12/21/18 0256 12/22/18 0744  AST 12* 14* 13* 15 13*  ALT 15 16 16 18 18   ALKPHOS 106 94 84 91 85  BILITOT 0.6 0.2* 0.3 0.6 0.4  PROT 5.7* 5.7* 5.3* 5.5* 5.2*  ALBUMIN 2.8* 2.7* 2.6* 2.8* 2.7*   No results for input(s): LIPASE, AMYLASE in the last 168 hours. No results for input(s): AMMONIA in the last 168 hours. Diabetic: No results for input(s): HGBA1C in the last 72 hours. Recent Labs  Lab 12/21/18 1153 12/21/18 1634 12/21/18 2055 12/22/18 0758 12/22/18 1244  GLUCAP 131* 94 97 105* 138*   Cardiac Enzymes: No results for input(s): CKTOTAL, CKMB, CKMBINDEX, TROPONINI in the last 168 hours. No  results for input(s): PROBNP in the last 8760 hours. Coagulation Profile: No results for input(s): INR, PROTIME in the last 168 hours. Thyroid Function Tests: No results for input(s): TSH, T4TOTAL, FREET4, T3FREE, THYROIDAB in the last 72 hours. Lipid Profile: No results for input(s): CHOL, HDL, LDLCALC, TRIG, CHOLHDL, LDLDIRECT in the last 72 hours. Anemia Panel: Recent Labs    12/20/18 0305 12/21/18 0256  FERRITIN 1,714* 2,022*   Urine analysis:    Component Value Date/Time   COLORURINE YELLOW 12/16/2018 0013   APPEARANCEUR CLEAR 12/16/2018 0013   LABSPEC 1.011 12/16/2018 0013   PHURINE 6.0 12/16/2018 0013   GLUCOSEU 50 (A) 12/16/2018 0013   HGBUR NEGATIVE 12/16/2018 0013   BILIRUBINUR NEGATIVE 12/16/2018 0013   KETONESUR NEGATIVE 12/16/2018 0013   PROTEINUR 100 (A) 12/16/2018 0013   NITRITE NEGATIVE 12/16/2018 0013   LEUKOCYTESUR SMALL (A) 12/16/2018 0013   Sepsis Labs: Invalid input(s): PROCALCITONIN, LACTICIDVEN  Microbiology: Recent Results (from the past 240 hour(s))  Culture, Urine     Status: Abnormal   Collection Time: 12/16/18  7:35 AM   Specimen: Urine, Catheterized  Result Value Ref Range Status   Specimen Description   Final    URINE, CATHETERIZED Performed at [facility], [address] [place], [place] [address]    Special Requests   Final    NONE Performed at [facility], [address] [place], [place] [address]    Culture (A)  Final    >=100,000 COLONIES/mL ESCHERICHIA COLI 30,000 COLONIES/mL VANCOMYCIN RESISTANT ENTEROCOCCUS ISOLATED    Report Status 12/21/2018 FINAL  Final   Organism ID, Bacteria ESCHERICHIA COLI (A)  Final   Organism ID, Bacteria VANCOMYCIN RESISTANT ENTEROCOCCUS ISOLATED (A)  Final      Susceptibility  Escherichia coli - MIC*    AMPICILLIN <=2 SENSITIVE Sensitive     CEFAZOLIN <=4 SENSITIVE Sensitive     CEFTRIAXONE <=1 SENSITIVE Sensitive     CIPROFLOXACIN <=0.25 SENSITIVE  Sensitive     GENTAMICIN <=1 SENSITIVE Sensitive     IMIPENEM <=0.25 SENSITIVE Sensitive     NITROFURANTOIN <=16 SENSITIVE Sensitive     TRIMETH/SULFA <=20 SENSITIVE Sensitive     AMPICILLIN/SULBACTAM <=2 SENSITIVE Sensitive     PIP/TAZO <=4 SENSITIVE Sensitive     * >=100,000 COLONIES/mL ESCHERICHIA COLI   Vancomycin resistant enterococcus isolated - MIC*    AMPICILLIN <=2 SENSITIVE Sensitive     NITROFURANTOIN <=16 SENSITIVE Sensitive     VANCOMYCIN >=32 RESISTANT Resistant     LINEZOLID 2 SENSITIVE Sensitive     * 30,000 COLONIES/mL VANCOMYCIN RESISTANT ENTEROCOCCUS ISOLATED    Radiology Studies: No results found.  Marybelle Giraldo T. [place]  If 7PM-7AM, please contact night-coverage [email or web address] Password [email or web address] 12/22/2018, 2:05 PM

## 2018-12-23 LAB — CBC WITH DIFFERENTIAL/PLATELET
Abs Immature Granulocytes: 0.1 10*3/uL — ABNORMAL HIGH (ref 0.00–0.07)
Basophils Absolute: 0 10*3/uL (ref 0.0–0.1)
Basophils Relative: 0 %
Eosinophils Absolute: 0 10*3/uL (ref 0.0–0.5)
Eosinophils Relative: 0 %
HCT: 30.4 % — ABNORMAL LOW (ref 36.0–46.0)
Hemoglobin: 9.4 g/dL — ABNORMAL LOW (ref 12.0–15.0)
Immature Granulocytes: 1 %
Lymphocytes Relative: 3 %
Lymphs Abs: 0.3 10*3/uL — ABNORMAL LOW (ref 0.7–4.0)
MCH: 29.7 pg (ref 26.0–34.0)
MCHC: 30.9 g/dL (ref 30.0–36.0)
MCV: 96.2 fL (ref 80.0–100.0)
Monocytes Absolute: 0.1 10*3/uL (ref 0.1–1.0)
Monocytes Relative: 1 %
Neutro Abs: 8.7 10*3/uL — ABNORMAL HIGH (ref 1.7–7.7)
Neutrophils Relative %: 95 %
Platelets: 142 10*3/uL — ABNORMAL LOW (ref 150–400)
RBC: 3.16 MIL/uL — ABNORMAL LOW (ref 3.87–5.11)
RDW: 17.6 % — ABNORMAL HIGH (ref 11.5–15.5)
WBC: 9.2 10*3/uL (ref 4.0–10.5)
nRBC: 0 % (ref 0.0–0.2)

## 2018-12-23 LAB — LACTATE DEHYDROGENASE: LDH: 182 U/L (ref 98–192)

## 2018-12-23 LAB — COMPREHENSIVE METABOLIC PANEL
ALT: 25 U/L (ref 0–44)
AST: 19 U/L (ref 15–41)
Albumin: 3.1 g/dL — ABNORMAL LOW (ref 3.5–5.0)
Alkaline Phosphatase: 104 U/L (ref 38–126)
Anion gap: 9 (ref 5–15)
BUN: 64 mg/dL — ABNORMAL HIGH (ref 6–20)
CO2: 23 mmol/L (ref 22–32)
Calcium: 10 mg/dL (ref 8.9–10.3)
Chloride: 107 mmol/L (ref 98–111)
Creatinine, Ser: 1.37 mg/dL — ABNORMAL HIGH (ref 0.44–1.00)
GFR calc Af Amer: 49 mL/min — ABNORMAL LOW (ref >60)
GFR calc non Af Amer: 42 mL/min — ABNORMAL LOW (ref >60)
Glucose, Bld: 185 mg/dL — ABNORMAL HIGH (ref 70–99)
Potassium: 5.3 mmol/L — ABNORMAL HIGH (ref 3.5–5.1)
Sodium: 139 mmol/L (ref 135–145)
Total Bilirubin: 0.6 mg/dL (ref 0.3–1.2)
Total Protein: 6.4 g/dL — ABNORMAL LOW (ref 6.5–8.1)

## 2018-12-23 LAB — GLUCOSE, CAPILLARY
Glucose-Capillary: 172 mg/dL — ABNORMAL HIGH (ref 70–99)
Glucose-Capillary: 192 mg/dL — ABNORMAL HIGH (ref 70–99)
Glucose-Capillary: 146 mg/dL — ABNORMAL HIGH (ref 70–99)
Glucose-Capillary: 154 mg/dL — ABNORMAL HIGH (ref 70–99)

## 2018-12-23 LAB — MAGNESIUM: Magnesium: 2.5 mg/dL — ABNORMAL HIGH (ref 1.7–2.4)

## 2018-12-23 LAB — D-DIMER, QUANTITATIVE: D-Dimer, Quant: 0.98 ug/mL-FEU — ABNORMAL HIGH (ref 0.00–0.50)

## 2018-12-23 LAB — C-REACTIVE PROTEIN: CRP: 0.6 mg/dL (ref <1.0)

## 2018-12-23 MED ORDER — GLYCERIN (LAXATIVE) 2.1 G RE SUPP
1.0000 | Freq: Once | RECTAL | Status: AC
  Administered 2018-12-23: 1 via RECTAL
  Filled 2018-12-23 (×2): qty 1

## 2018-12-23 MED ORDER — MILK AND MOLASSES ENEMA
1.0000 | Freq: Once | RECTAL | Status: DC | PRN
  Filled 2018-12-23: qty 240

## 2018-12-23 NOTE — Progress Notes (Signed)
PROGRESS NOTE  Martha Carter WUJ:[id number] DOB: Oct 18, 1960 DOA: 12/15/2018 PCP: System, Pcp Not In   LOS: 8 days   Brief Narrative / Interim history: 58 year old female with history of diastolic CHF, COPD and chronic RF on 3 to 5 L, CAD/CABG, paroxysmal A. fib on Eliquis, CKD-2, anemia of chronic disease, IDDM-2, PAD/right BKA, HTN, anxiety and depression are recent diagnosis was COVID-19 on 12/12/2018 presenting with shortness of breath, productive cough and lower extremity edema. [facility]. CXR suggestive for mild interstitial edema.  CTA negative for PE but concerning for CHF, hepatomegaly and splenomegaly.  Received IV Decadron 10 mg and albuterol inhalers and she was [facility].   Subjective / 24h Interval events: Complains of constipation and abdominal discomfort  Assessment & Plan:  Principal Problem Covid-19 Viral Illness/COPD exacerbation/chronic hypoxic respiratory failure -She normally is 3 to 5 L nasal cannula -She finished a course of remdesivir and has been started on steroids, continue Decadron for a total of 10 days -Supportive care, monitor inflammatory markers    COVID-19 Labs  Recent Labs    12/21/18 0256 12/22/18 0744 12/23/18 0234  DDIMER 0.84* 0.68* 0.98*  FERRITIN 2,022*  --   --   LDH 154 138 182  CRP 1.2* 0.6 0.6    No results found for: SARSCOV2NAA   Active Problems Acute on chronic diastolic CHF -Echo in August 2020 with EF of 55 to 65%, grade 1 DD.  Started on IV furosemide, continue, continue to monitor renal function  Acute kidney injury on chronic kidney disease stage IIIa -Ultrasound without significant findings, creatinine improving  Anemia of chronic disease -Stable, has upcoming appointment with hematologist for Procrit  Thrombocytopenia -We will see oncology as an outpatient, stable  E. coli and Enterococcus faecalis UTI -Prior to admission,  she has been on ceftriaxone and now on amoxicillin for 4 additional days  Paroxysmal A. fib -Continue beta-blockers Eliquis  Uncontrolled type 2 diabetes mellitus with hyperglycemia -Continue sliding scale, continue Tujeo  CBG (last 3)  Recent Labs    12/22/18 2209 12/23/18 0754 12/23/18 1140  GLUCAP 115* 172* 192*   CAD/CABG -No chest pain  PAD/right BKA  Essential hypertension: BP mildly elevated but improved. -Continue Imdur, hydralazine, bisoprolol and clonidine as above.  Anxiety/depression/insomnia: somewhat anxious about going back to the new facility. -Continue gabapentin, Zoloft, Atarax, melatonin  GERD -Continue Protonix and Carafate  Thrombocytopenia: Stable. -Continue monitoring  Nausea and emesis: Resolved. -Antiemetics as needed  Headache: Resolved. -As needed Tylenol. -Decadron could help  Constipation: Chronic issue.  Has history of gastroparesis.  Reports intolerance to Reglan. -Senokot-S and MiraLAX  Scheduled Meds: . sodium chloride   Intravenous Once  . albuterol  2 puff Inhalation TID  . amoxicillin  500 mg Oral Q8H  . apixaban  2.5 mg Oral BID  . atorvastatin  40 mg Oral q1800  . bisoprolol  10 mg Oral Daily  . cholecalciferol  2,000 Units Oral Daily  . dexamethasone (DECADRON) injection  6 mg Intravenous QHS  . folic acid  1 mg Oral Daily  . furosemide  20 mg Intravenous BID  . gabapentin  300 mg Oral TID  . Glycerin (Adult)  1 suppository Rectal Once  . hydrALAZINE  75 mg Oral Q8H  . insulin aspart  0-20 Units Subcutaneous TID WC  . insulin aspart  0-5 Units Subcutaneous QHS  . insulin aspart  6 Units Subcutaneous TID WC  .  insulin detemir  25 Units Subcutaneous BID  . ipratropium  2 puff Inhalation TID  . iron polysaccharides  150 mg Oral Daily  . isosorbide mononitrate  120 mg Oral Daily  . linagliptin  5 mg Oral Daily  . mouth rinse  15 mL Mouth Rinse BID  . Melatonin  3 mg Oral QHS  . mometasone-formoterol  2 puff  Inhalation BID  . multivitamin with minerals  1 tablet Per Tube Daily  . pantoprazole  40 mg Oral Daily  . saccharomyces boulardii  250 mg Oral BID  . sertraline  100 mg Oral Daily  . sodium chloride flush  3 mL Intravenous Q12H  . sucralfate  1 g Oral TID  . vitamin B-12  1,000 mcg Oral Daily  . vitamin C  500 mg Oral Daily  . zinc sulfate  220 mg Oral Daily   Continuous Infusions: PRN Meds:.acetaminophen, albuterol, alum & mag hydroxide-simeth, chlorpheniramine-HYDROcodone, cloNIDine, guaiFENesin-dextromethorphan, hydrOXYzine, milk and molasses, ondansetron (ZOFRAN) IV, phenol, polyethylene glycol, polyvinyl alcohol, promethazine, senna-docusate  DVT prophylaxis: Eliquis Code Status: Full code Family Communication: Discussed with patient Disposition Plan: SNF when bed available  Consultants:  None  Procedures:  none  Microbiology: none  Antimicrobials: none   Objective: Vitals:   12/22/18 2213 12/23/18 0542 12/23/18 0700 12/23/18 1023  BP: 136/67 (!) 155/73  (!) 158/73  Pulse: 84 82  85  Resp:  18    Temp: 98.6 F (37 C) 97.8 F (36.6 C)    TempSrc: Oral Oral    SpO2: 99% 98%  98%  Weight:   88.2 kg   Height:        Intake/Output Summary (Last 24 hours) at 12/23/2018 1438 Last data filed at 12/23/2018 1300 Gross per 24 hour  Intake 483 ml  Output 2350 ml  Net -1867 ml   Filed Weights   12/21/18 0500 12/22/18 0527 12/23/18 0700  Weight: 88.4 kg 88.4 kg 88.2 kg    Examination:  Constitutional: NAD Eyes: no scleral icterus ENMT: Mucous membranes are moist.  Neck: normal, supple Respiratory: clear to auscultation bilaterally, no wheezing, no crackles. Normal respiratory effort. No accessory muscle use.  Cardiovascular: Regular rate and rhythm, no murmurs / rubs / gallops.  Abdomen: non distended, no tenderness. Bowel sounds positive.  Musculoskeletal: no clubbing / cyanosis.  Skin: no rashes Neurologic: No focal deficits  Data Reviewed: I have  independently reviewed following labs and imaging studies   CBC: Recent Labs  Lab 12/19/18 0320 12/20/18 0305 12/21/18 0256 12/22/18 0744 12/23/18 0234  WBC 7.4 9.1 9.9 7.0 9.2  NEUTROABS  --   --  9.3* 6.3 8.7*  HGB 8.3* 7.4* 8.2* 7.5* 9.4*  HCT 26.5* 23.5* 25.6* 24.0* 30.4*  MCV 94.0 94.0 94.5 94.1 96.2  PLT 121* 115* 129* 111* 622*   Basic Metabolic Panel: Recent Labs  Lab 12/17/18 0853 12/18/18 1031 12/19/18 0320 12/20/18 0305 12/21/18 0256 12/22/18 0744 12/23/18 0234  NA 135 135 136 136 138 138 139  K 4.9 4.5 5.0 4.8 5.0 4.9 5.3*  CL 102 101 104 105 108 108 107  CO2 24 23 22  20* 22 23 23   GLUCOSE 248* 221* 277* 134* 114* 117* 185*  BUN 62* 67* 69* 67* 65* 63* 64*  CREATININE 1.75* 1.89* 2.01* 1.70* 1.62* 1.30* 1.37*  CALCIUM 9.6 9.5 9.2 9.1 9.5 9.5 10.0  MG 2.0 2.0 2.2 2.2  --  2.4 2.5*  PHOS 4.4 4.0 4.0 3.0  --   --   --  GFR: Estimated Creatinine Clearance: 45.6 mL/min (A) (by C-G formula based on SCr of 1.37 mg/dL (H)). Liver Function Tests: Recent Labs  Lab 12/19/18 0320 12/20/18 0305 12/21/18 0256 12/22/18 0744 12/23/18 0234  AST 14* 13* 15 13* 19  ALT 16 16 18 18 25   ALKPHOS 94 84 91 85 104  BILITOT 0.2* 0.3 0.6 0.4 0.6  PROT 5.7* 5.3* 5.5* 5.2* 6.4*  ALBUMIN 2.7* 2.6* 2.8* 2.7* 3.1*   No results for input(s): LIPASE, AMYLASE in the last 168 hours. No results for input(s): AMMONIA in the last 168 hours. Coagulation Profile: No results for input(s): INR, PROTIME in the last 168 hours. Cardiac Enzymes: No results for input(s): CKTOTAL, CKMB, CKMBINDEX, TROPONINI in the last 168 hours. BNP (last 3 results) No results for input(s): PROBNP in the last 8760 hours. HbA1C: No results for input(s): HGBA1C in the last 72 hours. CBG: Recent Labs  Lab 12/22/18 1244 12/22/18 1726 12/22/18 2209 12/23/18 0754 12/23/18 1140  GLUCAP 138* 122* 115* 172* 192*   Lipid Profile: No results for input(s): CHOL, HDL, LDLCALC, TRIG, CHOLHDL, LDLDIRECT in  the last 72 hours. Thyroid Function Tests: No results for input(s): TSH, T4TOTAL, FREET4, T3FREE, THYROIDAB in the last 72 hours. Anemia Panel: Recent Labs    12/21/18 0256  FERRITIN 2,022*   Urine analysis:    Component Value Date/Time   COLORURINE YELLOW 12/16/2018 0013   APPEARANCEUR CLEAR 12/16/2018 0013   LABSPEC 1.011 12/16/2018 0013   PHURINE 6.0 12/16/2018 0013   GLUCOSEU 50 (A) 12/16/2018 0013   HGBUR NEGATIVE 12/16/2018 0013   BILIRUBINUR NEGATIVE 12/16/2018 0013   KETONESUR NEGATIVE 12/16/2018 0013   PROTEINUR 100 (A) 12/16/2018 0013   NITRITE NEGATIVE 12/16/2018 0013   LEUKOCYTESUR SMALL (A) 12/16/2018 0013   Sepsis Labs: Invalid input(s): PROCALCITONIN, LACTICIDVEN  Recent Results (from the past 240 hour(s))  Culture, Urine     Status: Abnormal   Collection Time: 12/16/18  7:35 AM   Specimen: Urine, Catheterized  Result Value Ref Range Status   Specimen Description   Final    URINE, CATHETERIZED Performed at [facility], [address] [place], [place]    Special Requests   Final    NONE Performed at [facility], [address], [place], [place]    Culture (A)  Final    >=100,000 COLONIES/mL ESCHERICHIA COLI 30,000 COLONIES/mL VANCOMYCIN RESISTANT ENTEROCOCCUS ISOLATED    Report Status 12/21/2018 FINAL  Final   Organism ID, Bacteria ESCHERICHIA COLI (A)  Final   Organism ID, Bacteria VANCOMYCIN RESISTANT ENTEROCOCCUS ISOLATED (A)  Final      Susceptibility   Escherichia coli - MIC*    AMPICILLIN <=2 SENSITIVE Sensitive     CEFAZOLIN <=4 SENSITIVE Sensitive     CEFTRIAXONE <=1 SENSITIVE Sensitive     CIPROFLOXACIN <=0.25 SENSITIVE Sensitive     GENTAMICIN <=1 SENSITIVE Sensitive     IMIPENEM <=0.25 SENSITIVE Sensitive     NITROFURANTOIN <=16 SENSITIVE Sensitive     TRIMETH/SULFA <=20 SENSITIVE Sensitive     AMPICILLIN/SULBACTAM <=2 SENSITIVE Sensitive     PIP/TAZO <=4 SENSITIVE Sensitive      * >=100,000 COLONIES/mL ESCHERICHIA COLI   Vancomycin resistant enterococcus isolated - MIC*    AMPICILLIN <=2 SENSITIVE Sensitive     NITROFURANTOIN <=16 SENSITIVE Sensitive     VANCOMYCIN >=32 RESISTANT Resistant     LINEZOLID 2 SENSITIVE Sensitive     * 30,000 COLONIES/mL VANCOMYCIN RESISTANT ENTEROCOCCUS ISOLATED  Radiology Studies: No results found.   Pamella Pert, MD, PhD [facility]  Contact via  [email or web address]  [facility] Office Info P: [phone] F: [phone]

## 2018-12-24 LAB — GLUCOSE, CAPILLARY
Glucose-Capillary: 166 mg/dL — ABNORMAL HIGH (ref 70–99)
Glucose-Capillary: 223 mg/dL — ABNORMAL HIGH (ref 70–99)
Glucose-Capillary: 120 mg/dL — ABNORMAL HIGH (ref 70–99)
Glucose-Capillary: 181 mg/dL — ABNORMAL HIGH (ref 70–99)

## 2018-12-24 LAB — COMPREHENSIVE METABOLIC PANEL
ALT: 29 U/L (ref 0–44)
AST: 22 U/L (ref 15–41)
Albumin: 2.9 g/dL — ABNORMAL LOW (ref 3.5–5.0)
Alkaline Phosphatase: 86 U/L (ref 38–126)
Anion gap: 10 (ref 5–15)
BUN: 67 mg/dL — ABNORMAL HIGH (ref 6–20)
CO2: 22 mmol/L (ref 22–32)
Calcium: 9.5 mg/dL (ref 8.9–10.3)
Chloride: 107 mmol/L (ref 98–111)
Creatinine, Ser: 1.37 mg/dL — ABNORMAL HIGH (ref 0.44–1.00)
GFR calc Af Amer: 49 mL/min — ABNORMAL LOW (ref >60)
GFR calc non Af Amer: 42 mL/min — ABNORMAL LOW (ref >60)
Glucose, Bld: 161 mg/dL — ABNORMAL HIGH (ref 70–99)
Potassium: 5.2 mmol/L — ABNORMAL HIGH (ref 3.5–5.1)
Sodium: 139 mmol/L (ref 135–145)
Total Bilirubin: 0.8 mg/dL (ref 0.3–1.2)
Total Protein: 5.3 g/dL — ABNORMAL LOW (ref 6.5–8.1)

## 2018-12-24 LAB — CBC WITH DIFFERENTIAL/PLATELET
Abs Immature Granulocytes: 0.09 10*3/uL — ABNORMAL HIGH (ref 0.00–0.07)
Basophils Absolute: 0 10*3/uL (ref 0.0–0.1)
Basophils Relative: 0 %
Eosinophils Absolute: 0 10*3/uL (ref 0.0–0.5)
Eosinophils Relative: 0 %
HCT: 24.9 % — ABNORMAL LOW (ref 36.0–46.0)
Hemoglobin: 7.9 g/dL — ABNORMAL LOW (ref 12.0–15.0)
Immature Granulocytes: 1 %
Lymphocytes Relative: 2 %
Lymphs Abs: 0.2 10*3/uL — ABNORMAL LOW (ref 0.7–4.0)
MCH: 30.2 pg (ref 26.0–34.0)
MCHC: 31.7 g/dL (ref 30.0–36.0)
MCV: 95 fL (ref 80.0–100.0)
Monocytes Absolute: 0.2 10*3/uL (ref 0.1–1.0)
Monocytes Relative: 2 %
Neutro Abs: 10.6 10*3/uL — ABNORMAL HIGH (ref 1.7–7.7)
Neutrophils Relative %: 95 %
Platelets: 121 10*3/uL — ABNORMAL LOW (ref 150–400)
RBC: 2.62 MIL/uL — ABNORMAL LOW (ref 3.87–5.11)
RDW: 17.5 % — ABNORMAL HIGH (ref 11.5–15.5)
WBC: 11.1 10*3/uL — ABNORMAL HIGH (ref 4.0–10.5)
nRBC: 0 % (ref 0.0–0.2)

## 2018-12-24 LAB — LACTATE DEHYDROGENASE: LDH: 161 U/L (ref 98–192)

## 2018-12-24 LAB — MAGNESIUM: Magnesium: 2.6 mg/dL — ABNORMAL HIGH (ref 1.7–2.4)

## 2018-12-24 LAB — SARS CORONAVIRUS 2 (TAT 6-24 HRS): SARS Coronavirus 2: NEGATIVE

## 2018-12-24 LAB — D-DIMER, QUANTITATIVE: D-Dimer, Quant: 0.72 ug/mL-FEU — ABNORMAL HIGH (ref 0.00–0.50)

## 2018-12-24 LAB — C-REACTIVE PROTEIN: CRP: 0.8 mg/dL (ref <1.0)

## 2018-12-24 NOTE — TOC Progression Note (Signed)
Transition of Care [facility]) - Progression Note    Patient Details  Name: Martha Carter MRN: [id number] Date of Birth: 1960-12-28  Transition of Care [facility]) CM/SW [place], LCSW Phone Number: 12/24/2018, 11:56 AM  Clinical Narrative:   CSW spoke with patient via phone. Patient stated she does not want to return back to [facility]. Patient stated that [facility] did not give her the medication she needed. CSW went over the bed offers with patient. Patient stated she would prefer [facility]. Patient requested to have another covid test stating that if she can get a negative covid test she will be able to go back home but if its positive she will go to [place]. CSW awaiting results of new covid test. Patient has a bed at [facility]    Expected Discharge Plan: [place] Barriers to Discharge: No Barriers Identified  Expected Discharge Plan and Services Expected Discharge Plan: [place] arrangements for the past 2 months: [place]                                       Social Determinants of Health (SDOH) Interventions    Readmission Risk Interventions No flowsheet data found.

## 2018-12-24 NOTE — Care Management Important Message (Signed)
Important Message  Patient Details IM Letter given to [place] Case Manager to present to the Patient Name: PAHOLA DIMMITT MRN: [id number] Date of Birth: [birth year]   Medicare Important Message Given:  Yes     Kerin Salen 12/24/2018, 11:56 AM

## 2018-12-24 NOTE — Progress Notes (Signed)
Physical Therapy Treatment Patient Details Name: Martha Carter MRN: [id number] DOB: [birth year] Today's Date: 12/24/2018    History of Present Illness 58 year old female with history of diastolic CHF, COPD and chronic RF on 3 to 5 L, CAD/CABG, paroxysmal A. fib on Eliquis, CKD-2, anemia of chronic disease, IDDM-2, PAD/right BKA, HTN, anxiety and depression are recent diagnosis was COVID-19 on 12/12/2018 in [place] and transferred from her nursing facility to [facility].  Pt admitted 12/15/18 for COPD exacerbation due to COVID-19 pneumonia/infection impression with chronic respiratory failure on 3 to 5 L at baseline.    PT Comments    RN stated "she needs to get up" been in bed whole admission.  Upon entering room and introducing myself pt immediately stated "I'm NOT doing Therapy today".  Pt only agreed to perform limited bed mobility.  General bed mobility comments: pt declined any OOB activity.  Side to side rolling to straighten bed pads and scoot to [facility] for proper positioning.  Re wrapped ACE wrap R stump to enforcing "dog ears" cris-cross pattern.  Pt stated "I will start my Physical Therapy when I get to a new facility".  Reported to RN, pt declined to get OOB.    Follow Up Recommendations  SNF     Equipment Recommendations  None recommended by PT    Recommendations for Other Services       Precautions / Restrictions Precautions Precautions: Fall Precaution Comments: (06-04-18) Restrictions Weight Bearing Restrictions: Yes RLE Weight Bearing: Non weight bearing Other Position/Activity Restrictions: pt does NOT have a prosthesis for her R BKA    Mobility  Bed Mobility Overal bed mobility: Needs Assistance Bed Mobility: Supine to Sit;Sit to Supine     Supine to sit: Min assist;Mod assist Sit to supine: Min assist;Mod assist   General bed mobility comments: pt declined any OOB activity.  Side to side rolling to straighten bed pads and scoot to [facility] for proper  positioning.  Transfers                 General transfer comment: pt declined.  Stated "they use a machine to get me up".  I offered to use ours and sge still declined to get OOB.  Ambulation/Gait             General Gait Details: non amb since her BKA.  Pt uses a wc for mobility at prior SNF   Stairs             Wheelchair Mobility    Modified Rankin (Stroke Patients Only)       Balance                                            Cognition Arousal/Alertness: Awake/alert Behavior During Therapy: WFL for tasks assessed/performed Overall Cognitive Status: Within Functional Limits for tasks assessed                                 General Comments: non compliant most times      Exercises      General Comments        Pertinent Vitals/Pain Pain Assessment: No/denies pain    Home Living                      Prior Function  PT Goals (current goals can now be found in the care plan section) Progress towards PT goals: Progressing toward goals    Frequency    Min 2X/week      PT Plan Current plan remains appropriate    Co-evaluation              AM-PAC PT "6 Clicks" Mobility   Outcome Measure  Help needed turning from your back to your side while in a flat bed without using bedrails?: A Lot Help needed moving from lying on your back to sitting on the side of a flat bed without using bedrails?: Total Help needed moving to and from a bed to a chair (including a wheelchair)?: Total Help needed standing up from a chair using your arms (e.g., wheelchair or bedside chair)?: Total Help needed to walk in hospital room?: Total   6 Click Score: 6    End of Session Equipment Utilized During Treatment: Oxygen Activity Tolerance: Other (comment) Patient left: in bed;with call bell/phone within reach;with bed alarm set Nurse Communication: Mobility status(pt declined any OOB activity) PT  Visit Diagnosis: Muscle weakness (generalized) (M62.81);Other abnormalities of gait and mobility (R26.89)     Time: 5945-8592 PT Time Calculation (min) (ACUTE ONLY): 12 min  Charges:  $Therapeutic Activity: 23-37 mins                     Rica Koyanagi  PTA Acute  Rehabilitation Services Pager      [phone] Office      [phone]

## 2018-12-24 NOTE — Progress Notes (Signed)
PROGRESS NOTE  Martha Carter QMG:[id number] DOB: 1960/02/09 DOA: 12/15/2018 PCP: System, Pcp Not In   LOS: 9 days   Brief Narrative / Interim history: 58 year old female with history of diastolic CHF, COPD and chronic RF on 3 to 5 L, CAD/CABG, paroxysmal A. fib on Eliquis, CKD-2, anemia of chronic disease, IDDM-2, PAD/right BKA, HTN, anxiety and depression are recent diagnosis was COVID-19 on 12/12/2018 presenting with shortness of breath, productive cough and lower extremity edema. Tested positive for Covid on 12/12/2018 to [facility] ED. CXR suggestive for mild interstitial edema.  CTA negative for PE but concerning for CHF, hepatomegaly and splenomegaly.  Received IV Decadron 10 mg and albuterol inhalers and she was transferred from his usual NF to [facility].   Subjective / 24h Interval events: Had a good BM yesterday. Still some abdominal discomfort, but better. No shortness of breath   Assessment & Plan:  Principal Problem Covid-19 Viral Illness/COPD exacerbation/chronic hypoxic respiratory failure -She normally is 3 to 5 L nasal cannula -She finished a course of remdesivir and has been started on steroids, continue Decadron for a total of 10 days -Supportive care, monitor inflammatory markers -stable, improved respiratory status    COVID-19 Labs  Recent Labs    12/22/18 0744 12/23/18 0234 12/24/18 0242  DDIMER 0.68* 0.98* 0.72*  LDH 138 182 161  CRP 0.6 0.6 0.8    No results found for: SARSCOV2NAA  Active Problems Acute on chronic diastolic CHF -Echo in August 2020 with EF of 55 to 65%, grade 1 DD.  Started on IV furosemide, continue, continue to monitor renal function, stable this morning, Cr. 1.37  Acute kidney injury on chronic kidney disease stage IIIa -Ultrasound without significant findings, creatinine improving  Anemia of chronic disease -Stable, has upcoming appointment with hematologist for [place]  Thrombocytopenia -We will see oncology as an  outpatient, stable  E. coli and Enterococcus faecalis UTI -Prior to admission, she has been on ceftriaxone and now on amoxicillin for 3 additional days  Paroxysmal A. fib -Continue beta-blockers Eliquis  Uncontrolled type 2 diabetes mellitus with hyperglycemia -Continue sliding scale, continue Tujeo  CBG (last 3)  Recent Labs    12/23/18 1723 12/23/18 2118 12/24/18 0821  GLUCAP 146* 154* 166*   CAD/CABG -No chest pain  PAD/right BKA  Essential hypertension: BP mildly elevated but improved. -Continue Imdur, hydralazine, bisoprolol and clonidine as above.  Anxiety/depression/insomnia: somewhat anxious about going back to the new facility. -Continue gabapentin, Zoloft, Atarax, melatonin  GERD -Continue Protonix and Carafate  Thrombocytopenia: Stable. -Continue monitoring  Nausea and emesis: Resolved. -Antiemetics as needed  Headache: Resolved. -As needed Tylenol. -Decadron could help  Constipation: Chronic issue.  Has history of gastroparesis.  Reports intolerance to Reglan. -Senokot-S and MiraLAX  Scheduled Meds: . sodium chloride   Intravenous Once  . albuterol  2 puff Inhalation TID  . apixaban  2.5 mg Oral BID  . atorvastatin  40 mg Oral q1800  . bisoprolol  10 mg Oral Daily  . cholecalciferol  2,000 Units Oral Daily  . dexamethasone (DECADRON) injection  6 mg Intravenous QHS  . folic acid  1 mg Oral Daily  . furosemide  20 mg Intravenous BID  . gabapentin  300 mg Oral TID  . hydrALAZINE  75 mg Oral Q8H  . insulin aspart  0-20 Units Subcutaneous TID WC  . insulin aspart  0-5 Units Subcutaneous QHS  . insulin aspart  6 Units Subcutaneous TID WC  . insulin detemir  25 Units Subcutaneous  BID  . ipratropium  2 puff Inhalation TID  . iron polysaccharides  150 mg Oral Daily  . isosorbide mononitrate  120 mg Oral Daily  . linagliptin  5 mg Oral Daily  . mouth rinse  15 mL Mouth Rinse BID  . Melatonin  3 mg Oral QHS  . mometasone-formoterol  2 puff  Inhalation BID  . multivitamin with minerals  1 tablet Per Tube Daily  . pantoprazole  40 mg Oral Daily  . saccharomyces boulardii  250 mg Oral BID  . sertraline  100 mg Oral Daily  . sodium chloride flush  3 mL Intravenous Q12H  . sucralfate  1 g Oral TID  . vitamin B-12  1,000 mcg Oral Daily  . vitamin C  500 mg Oral Daily  . zinc sulfate  220 mg Oral Daily   Continuous Infusions: PRN Meds:.acetaminophen, albuterol, alum & mag hydroxide-simeth, chlorpheniramine-HYDROcodone, cloNIDine, guaiFENesin-dextromethorphan, hydrOXYzine, milk and molasses, ondansetron (ZOFRAN) IV, phenol, polyethylene glycol, polyvinyl alcohol, promethazine, senna-docusate  DVT prophylaxis: Eliquis Code Status: Full code Family Communication: Discussed with patient Disposition Plan: SNF when bed available  Consultants:  None  Procedures:  none  Microbiology: none  Antimicrobials: none   Objective: Vitals:   12/23/18 1824 12/23/18 2121 12/24/18 0601 12/24/18 1020  BP: (!) 172/75 (!) 161/70 (!) 143/66 (!) 162/71  Pulse: 98 94 79 85  Resp:  18 16 18   Temp:  99.4 F (37.4 C) 98.5 F (36.9 C) 98.4 F (36.9 C)  TempSrc:  Oral Oral Oral  SpO2: 95% 94% 98% 99%  Weight:   85 kg   Height:        Intake/Output Summary (Last 24 hours) at 12/24/2018 1152 Last data filed at 12/24/2018 0602 Gross per 24 hour  Intake 480 ml  Output 1850 ml  Net -1370 ml   Filed Weights   12/22/18 0527 12/23/18 0700 12/24/18 0601  Weight: 88.4 kg 88.2 kg 85 kg    Examination:  Constitutional: no distress Eyes: no icterus  Respiratory: CTA, no wheezing Cardiovascular: Regular rate and rhythm, no murmurs / rubs / gallops.  Abdomen: soft, ND, NT Neurologic: non focal   Data Reviewed: I have independently reviewed following labs and imaging studies   CBC: Recent Labs  Lab 12/20/18 0305 12/21/18 0256 12/22/18 0744 12/23/18 0234 12/24/18 0242  WBC 9.1 9.9 7.0 9.2 11.1*  NEUTROABS  --  9.3* 6.3 8.7*  10.6*  HGB 7.4* 8.2* 7.5* 9.4* 7.9*  HCT 23.5* 25.6* 24.0* 30.4* 24.9*  MCV 94.0 94.5 94.1 96.2 95.0  PLT 115* 129* 111* 142* 121*   Basic Metabolic Panel: Recent Labs  Lab 12/18/18 1031 12/19/18 0320 12/20/18 0305 12/21/18 0256 12/22/18 0744 12/23/18 0234 12/24/18 0242  NA 135 136 136 138 138 139 139  K 4.5 5.0 4.8 5.0 4.9 5.3* 5.2*  CL 101 104 105 108 108 107 107  CO2 23 22 20* 22 23 23 22   GLUCOSE 221* 277* 134* 114* 117* 185* 161*  BUN 67* 69* 67* 65* 63* 64* 67*  CREATININE 1.89* 2.01* 1.70* 1.62* 1.30* 1.37* 1.37*  CALCIUM 9.5 9.2 9.1 9.5 9.5 10.0 9.5  MG 2.0 2.2 2.2  --  2.4 2.5* 2.6*  PHOS 4.0 4.0 3.0  --   --   --   --    GFR: Estimated Creatinine Clearance: 44.7 mL/min (A) (by C-G formula based on SCr of 1.37 mg/dL (H)). Liver Function Tests: Recent Labs  Lab 12/20/18 0305 12/21/18 0256 12/22/18 0744 12/23/18 0234  12/24/18 0242  AST 13* 15 13* 19 22  ALT 16 18 18 25 29   ALKPHOS 84 91 85 104 86  BILITOT 0.3 0.6 0.4 0.6 0.8  PROT 5.3* 5.5* 5.2* 6.4* 5.3*  ALBUMIN 2.6* 2.8* 2.7* 3.1* 2.9*   No results for input(s): LIPASE, AMYLASE in the last 168 hours. No results for input(s): AMMONIA in the last 168 hours. Coagulation Profile: No results for input(s): INR, PROTIME in the last 168 hours. Cardiac Enzymes: No results for input(s): CKTOTAL, CKMB, CKMBINDEX, TROPONINI in the last 168 hours. BNP (last 3 results) No results for input(s): PROBNP in the last 8760 hours. HbA1C: No results for input(s): HGBA1C in the last 72 hours. CBG: Recent Labs  Lab 12/23/18 0754 12/23/18 1140 12/23/18 1723 12/23/18 2118 12/24/18 0821  GLUCAP 172* 192* 146* 154* 166*   Lipid Profile: No results for input(s): CHOL, HDL, LDLCALC, TRIG, CHOLHDL, LDLDIRECT in the last 72 hours. Thyroid Function Tests: No results for input(s): TSH, T4TOTAL, FREET4, T3FREE, THYROIDAB in the last 72 hours. Anemia Panel: No results for input(s): VITAMINB12, FOLATE, FERRITIN, TIBC, IRON,  RETICCTPCT in the last 72 hours. Urine analysis:    Component Value Date/Time   COLORURINE YELLOW 12/16/2018 0013   APPEARANCEUR CLEAR 12/16/2018 0013   LABSPEC 1.011 12/16/2018 0013   PHURINE 6.0 12/16/2018 0013   GLUCOSEU 50 (A) 12/16/2018 0013   HGBUR NEGATIVE 12/16/2018 0013   BILIRUBINUR NEGATIVE 12/16/2018 0013   KETONESUR NEGATIVE 12/16/2018 0013   PROTEINUR 100 (A) 12/16/2018 0013   NITRITE NEGATIVE 12/16/2018 0013   LEUKOCYTESUR SMALL (A) 12/16/2018 0013   Sepsis Labs: Invalid input(s): PROCALCITONIN, LACTICIDVEN  Recent Results (from the past 240 hour(s))  Culture, Urine     Status: Abnormal   Collection Time: 12/16/18  7:35 AM   Specimen: Urine, Catheterized  Result Value Ref Range Status   Specimen Description   Final    URINE, CATHETERIZED Performed at [facility] [address] [place], [place] [address]    Special Requests   Final    NONE Performed at [facility], [place] [address], [place],  [address]    Culture (A)  Final    >=100,000 COLONIES/mL ESCHERICHIA COLI 30,000 COLONIES/mL VANCOMYCIN RESISTANT ENTEROCOCCUS ISOLATED    Report Status 12/21/2018 FINAL  Final   Organism ID, Bacteria ESCHERICHIA COLI (A)  Final   Organism ID, Bacteria VANCOMYCIN RESISTANT ENTEROCOCCUS ISOLATED (A)  Final      Susceptibility   Escherichia coli - MIC*    AMPICILLIN <=2 SENSITIVE Sensitive     CEFAZOLIN <=4 SENSITIVE Sensitive     CEFTRIAXONE <=1 SENSITIVE Sensitive     CIPROFLOXACIN <=0.25 SENSITIVE Sensitive     GENTAMICIN <=1 SENSITIVE Sensitive     IMIPENEM <=0.25 SENSITIVE Sensitive     NITROFURANTOIN <=16 SENSITIVE Sensitive     TRIMETH/SULFA <=20 SENSITIVE Sensitive     AMPICILLIN/SULBACTAM <=2 SENSITIVE Sensitive     PIP/TAZO <=4 SENSITIVE Sensitive     * >=100,000 COLONIES/mL ESCHERICHIA COLI   Vancomycin resistant enterococcus isolated - MIC*    AMPICILLIN <=2 SENSITIVE Sensitive     NITROFURANTOIN <=16  SENSITIVE Sensitive     VANCOMYCIN >=32 RESISTANT Resistant     LINEZOLID 2 SENSITIVE Sensitive     * 30,000 COLONIES/mL VANCOMYCIN RESISTANT ENTEROCOCCUS ISOLATED      Radiology Studies: No results found.   Marzetta Board, MD, PhD [facility]  Contact via  [email or web address]  [place] P: [phone] F: [phone]

## 2018-12-25 LAB — URINALYSIS, ROUTINE W REFLEX MICROSCOPIC
Bilirubin Urine: NEGATIVE
Glucose, UA: NEGATIVE mg/dL
Hgb urine dipstick: NEGATIVE
Ketones, ur: NEGATIVE mg/dL
Nitrite: NEGATIVE
Protein, ur: >=300 mg/dL — AB
Specific Gravity, Urine: 1.014 (ref 1.005–1.030)
pH: 5 (ref 5.0–8.0)

## 2018-12-25 LAB — GLUCOSE, CAPILLARY
Glucose-Capillary: 153 mg/dL — ABNORMAL HIGH (ref 70–99)
Glucose-Capillary: 204 mg/dL — ABNORMAL HIGH (ref 70–99)
Glucose-Capillary: 188 mg/dL — ABNORMAL HIGH (ref 70–99)
Glucose-Capillary: 229 mg/dL — ABNORMAL HIGH (ref 70–99)

## 2018-12-25 LAB — CBC WITH DIFFERENTIAL/PLATELET
Abs Immature Granulocytes: 0.07 10*3/uL (ref 0.00–0.07)
Basophils Absolute: 0 10*3/uL (ref 0.0–0.1)
Basophils Relative: 0 %
Eosinophils Absolute: 0 10*3/uL (ref 0.0–0.5)
Eosinophils Relative: 0 %
HCT: 23.5 % — ABNORMAL LOW (ref 36.0–46.0)
Hemoglobin: 7.2 g/dL — ABNORMAL LOW (ref 12.0–15.0)
Immature Granulocytes: 1 %
Lymphocytes Relative: 3 %
Lymphs Abs: 0.2 10*3/uL — ABNORMAL LOW (ref 0.7–4.0)
MCH: 29.8 pg (ref 26.0–34.0)
MCHC: 30.6 g/dL (ref 30.0–36.0)
MCV: 97.1 fL (ref 80.0–100.0)
Monocytes Absolute: 0.1 10*3/uL (ref 0.1–1.0)
Monocytes Relative: 2 %
Neutro Abs: 8.1 10*3/uL — ABNORMAL HIGH (ref 1.7–7.7)
Neutrophils Relative %: 94 %
Platelets: 117 10*3/uL — ABNORMAL LOW (ref 150–400)
RBC: 2.42 MIL/uL — ABNORMAL LOW (ref 3.87–5.11)
RDW: 17.8 % — ABNORMAL HIGH (ref 11.5–15.5)
WBC: 8.5 10*3/uL (ref 4.0–10.5)
nRBC: 0 % (ref 0.0–0.2)

## 2018-12-25 LAB — COMPREHENSIVE METABOLIC PANEL
ALT: 34 U/L (ref 0–44)
AST: 22 U/L (ref 15–41)
Albumin: 2.6 g/dL — ABNORMAL LOW (ref 3.5–5.0)
Alkaline Phosphatase: 86 U/L (ref 38–126)
Anion gap: 8 (ref 5–15)
BUN: 66 mg/dL — ABNORMAL HIGH (ref 6–20)
CO2: 25 mmol/L (ref 22–32)
Calcium: 9.5 mg/dL (ref 8.9–10.3)
Chloride: 106 mmol/L (ref 98–111)
Creatinine, Ser: 1.42 mg/dL — ABNORMAL HIGH (ref 0.44–1.00)
GFR calc Af Amer: 47 mL/min — ABNORMAL LOW (ref >60)
GFR calc non Af Amer: 40 mL/min — ABNORMAL LOW (ref >60)
Glucose, Bld: 189 mg/dL — ABNORMAL HIGH (ref 70–99)
Potassium: 5 mmol/L (ref 3.5–5.1)
Sodium: 139 mmol/L (ref 135–145)
Total Bilirubin: 0.7 mg/dL (ref 0.3–1.2)
Total Protein: 5.5 g/dL — ABNORMAL LOW (ref 6.5–8.1)

## 2018-12-25 LAB — D-DIMER, QUANTITATIVE: D-Dimer, Quant: 0.8 ug/mL-FEU — ABNORMAL HIGH (ref 0.00–0.50)

## 2018-12-25 LAB — MAGNESIUM: Magnesium: 2.4 mg/dL (ref 1.7–2.4)

## 2018-12-25 LAB — LACTATE DEHYDROGENASE: LDH: 146 U/L (ref 98–192)

## 2018-12-25 LAB — C-REACTIVE PROTEIN: CRP: 1.3 mg/dL — ABNORMAL HIGH (ref <1.0)

## 2018-12-25 LAB — PREPARE RBC (CROSSMATCH)

## 2018-12-25 MED ORDER — SODIUM CHLORIDE 0.9% IV SOLUTION: Freq: Once | INTRAVENOUS | Status: AC

## 2018-12-25 MED ORDER — AMOXICILLIN-POT CLAVULANATE 875-125 MG PO TABS
1.0000 | ORAL_TABLET | Freq: Two times a day (BID) | ORAL | Status: DC
  Administered 2018-12-25 – 2018-12-26 (×3): 1 via ORAL
  Filled 2018-12-25 (×3): qty 1

## 2018-12-25 MED ORDER — GLYCERIN (LAXATIVE) 2.1 G RE SUPP
1.0000 | Freq: Every day | RECTAL | Status: DC | PRN
  Administered 2018-12-25: 1 via RECTAL
  Filled 2018-12-25 (×2): qty 1

## 2018-12-25 NOTE — Progress Notes (Signed)
PROGRESS NOTE  Martha BustleGlenda W Ganaway ZOX:[id number] DOB: December 29, 1960 DOA: 12/15/2018 PCP: System, Pcp Not In   LOS: 10 days   Brief Narrative / Interim history: 58 year old female with history of diastolic CHF, COPD and chronic RF on 3 to 5 L, CAD/CABG, paroxysmal A. fib on Eliquis, CKD-2, anemia of chronic disease, IDDM-2, PAD/right BKA, HTN, anxiety and depression are recent diagnosis was COVID-19 on 12/12/2018 presenting with shortness of breath, productive cough and lower extremity edema. [facility] 12[facility]12[facility]. CXR suggestive for mild interstitial edema.  CTA negative for PE but concerning for CHF, hepatomegaly and splenomegaly.  Received IV Decadron 10 mg and albuterol inhalers and she was [facility] his usual [facility].   Subjective / 24h Interval events: Overall feeling well, no dyspnea, no chest pain, no abdominal pain, no shortness of breath  Assessment & Plan:  Principal Problem Covid-19 Viral Illness/COPD exacerbation/chronic hypoxic respiratory failure -She normally is 3 to 5 L nasal cannula -She finished a course of remdesivir and has been started on steroids, continue Decadron for a total of 10 days with 1 day remaining -Supportive care, monitor inflammatory markers, stable -Repeat test for SNF 12/21 with SARS-CoV-2 negative   COVID-19 Labs  Recent Labs    12/23/18 0234 12/24/18 0242 12/25/18 0320  DDIMER 0.98* 0.72* 0.80*  LDH 182 161 146  CRP 0.6 0.8 1.3*    Lab Results  Component Value Date   SARSCOV2NAA NEGATIVE 12/24/2018    Active Problems Acute on chronic diastolic CHF -Echo in August 2020 with EF of 55 to 65%, grade 1 DD.  Started on IV furosemide, for several days, creatinine overall stable but increasing oral with 1.4, she appears euvolemic and hold further Lasix  Acute kidney injury on chronic kidney disease stage IIIa -Ultrasound without significant findings, creatinine overall stable, now off Lasix   Anemia of chronic disease /thrombocytopenia -Stable, has upcoming appointment with hematologist, she missed an appointment last week.  She tells me that she occasionally has blood transfusions.  Her hemoglobin gradually decreased in the last couple of days and 7.2 this morning, will transfuse a unit of packed red blood cells -She missed her appointment with hematology last week due to Covid, will reschedule.  E. coli and Enterococcus faecalis UTI -Prior to admission, she has been on ceftriaxone and amoxicillin -She has been monitored off antibiotics however on 12/22 started having again dysuria and discomfort with urination, repeat urinalysis again shows evidence of UTI.  Placed again on Augmentin  Paroxysmal A. fib -Continue beta-blockers Eliquis  Uncontrolled type 2 diabetes mellitus with hyperglycemia -Continue sliding scale, continue Tujeo  CBG (last 3)  Recent Labs    12/24/18 2053 12/25/18 0821 12/25/18 1153  GLUCAP 181* 153* 204*   CAD/CABG -No chest pain  PAD/right BKA  Essential hypertension: BP mildly elevated but improved. -Continue Imdur, hydralazine, bisoprolol and clonidine as above.  Anxiety/depression/insomnia: somewhat anxious about going back to the new facility. -Continue gabapentin, Zoloft, Atarax, melatonin  GERD -Continue Protonix and Carafate  Nausea and emesis: Resolved. -Antiemetics as needed  Headache: Resolved. -As needed Tylenol. -Decadron could help  Constipation: Chronic issue.  Has history of gastroparesis.  Reports intolerance to Reglan. -Senokot-S and MiraLAX  Scheduled Meds: . sodium chloride   Intravenous Once  . sodium chloride   Intravenous Once  . albuterol  2 puff Inhalation TID  . amoxicillin-clavulanate  1 tablet Oral Q12H  . apixaban  2.5 mg Oral BID  .  atorvastatin  40 mg Oral q1800  . bisoprolol  10 mg Oral Daily  . cholecalciferol  2,000 Units Oral Daily  . folic acid  1 mg Oral Daily  . furosemide  20 mg  Intravenous BID  . gabapentin  300 mg Oral TID  . hydrALAZINE  75 mg Oral Q8H  . insulin aspart  0-20 Units Subcutaneous TID WC  . insulin aspart  0-5 Units Subcutaneous QHS  . insulin aspart  6 Units Subcutaneous TID WC  . insulin detemir  25 Units Subcutaneous BID  . ipratropium  2 puff Inhalation TID  . iron polysaccharides  150 mg Oral Daily  . isosorbide mononitrate  120 mg Oral Daily  . linagliptin  5 mg Oral Daily  . mouth rinse  15 mL Mouth Rinse BID  . Melatonin  3 mg Oral QHS  . mometasone-formoterol  2 puff Inhalation BID  . multivitamin with minerals  1 tablet Per Tube Daily  . pantoprazole  40 mg Oral Daily  . saccharomyces boulardii  250 mg Oral BID  . sertraline  100 mg Oral Daily  . sodium chloride flush  3 mL Intravenous Q12H  . sucralfate  1 g Oral TID  . vitamin B-12  1,000 mcg Oral Daily  . vitamin C  500 mg Oral Daily  . zinc sulfate  220 mg Oral Daily   Continuous Infusions: PRN Meds:.acetaminophen, albuterol, alum & mag hydroxide-simeth, chlorpheniramine-HYDROcodone, cloNIDine, guaiFENesin-dextromethorphan, hydrOXYzine, milk and molasses, ondansetron (ZOFRAN) IV, phenol, polyethylene glycol, polyvinyl alcohol, promethazine, senna-docusate  DVT prophylaxis: Eliquis Code Status: Full code Family Communication: Discussed with patient Disposition Plan: SNF when bed available  Consultants:  None  Procedures:  none  Microbiology: none  Antimicrobials: none   Objective: Vitals:   12/25/18 0500 12/25/18 0552 12/25/18 1047 12/25/18 1157  BP:  138/71 (!) 162/73 (!) 172/80  Pulse:  78 87 91  Resp:  18 18 16   Temp:  (!) 97.5 F (36.4 C) (!) 97.5 F (36.4 C) 97.8 F (36.6 C)  TempSrc:  Oral Oral Oral  SpO2:  100% 100% 100%  Weight: 85 kg     Height:        Intake/Output Summary (Last 24 hours) at 12/25/2018 1333 Last data filed at 12/25/2018 1056 Gross per 24 hour  Intake 480 ml  Output 1250 ml  Net -770 ml   Filed Weights   12/23/18 0700  12/24/18 0601 12/25/18 0500  Weight: 88.2 kg 85 kg 85 kg    Examination:  Constitutional: NAD, calm, comfortable Eyes: PERRL, lids and conjunctivae normal ENMT: Mucous membranes are moist.  Neck: normal, supple Respiratory: clear to auscultation bilaterally, no wheezing, no crackles. Normal respiratory effort.  Cardiovascular: Regular rate and rhythm, no murmurs / rubs / gallops. No LE edema.  Abdomen: no tenderness. Bowel sounds positive.  Skin: no rashes, lesions, ulcers. No induration Neurologic: non focal   Data Reviewed: I have independently reviewed following labs and imaging studies   CBC: Recent Labs  Lab 12/21/18 0256 12/22/18 0744 12/23/18 0234 12/24/18 0242 12/25/18 0320  WBC 9.9 7.0 9.2 11.1* 8.5  NEUTROABS 9.3* 6.3 8.7* 10.6* 8.1*  HGB 8.2* 7.5* 9.4* 7.9* 7.2*  HCT 25.6* 24.0* 30.4* 24.9* 23.5*  MCV 94.5 94.1 96.2 95.0 97.1  PLT 129* 111* 142* 121* 322*   Basic Metabolic Panel: Recent Labs  Lab 12/19/18 0320 12/20/18 0305 12/21/18 0256 12/22/18 0744 12/23/18 0234 12/24/18 0242 12/25/18 0320  NA 136 136 138 138 139 139 139  K 5.0 4.8 5.0 4.9 5.3* 5.2* 5.0  CL 104 105 108 108 107 107 106  CO2 22 20* GLUCOSE 277* 134* 114* 117* 185* 161* 189*  BUN 69* 67* 65* 63* 64* 67* 66*  CREATININE 2.01* 1.70* 1.62* 1.30* 1.37* 1.37* 1.42*  CALCIUM 9.2 9.1 9.5 9.5 10.0 9.5 9.5  MG 2.2 2.2  --  2.4 2.5* 2.6* 2.4  PHOS 4.0 3.0  --   --   --   --   --    GFR: Estimated Creatinine Clearance: 43.2 mL/min (A) (by C-G formula based on SCr of 1.42 mg/dL (H)). Liver Function Tests: Recent Labs  Lab 12/21/18 0256 12/22/18 0744 12/23/18 0234 12/24/18 0242 12/25/18 0320  AST 15 13* ALT 34  ALKPHOS 91 85 104 86 86  BILITOT 0.6 0.4 0.6 0.8 0.7  PROT 5.5* 5.2* 6.4* 5.3* 5.5*  ALBUMIN 2.8* 2.7* 3.1* 2.9* 2.6*   No results for input(s): LIPASE, AMYLASE in the last 168 hours. No results for input(s): AMMONIA in the last 168  hours. Coagulation Profile: No results for input(s): INR, PROTIME in the last 168 hours. Cardiac Enzymes: No results for input(s): CKTOTAL, CKMB, CKMBINDEX, TROPONINI in the last 168 hours. BNP (last 3 results) No results for input(s): PROBNP in the last 8760 hours. HbA1C: No results for input(s): HGBA1C in the last 72 hours. CBG: Recent Labs  Lab 12/24/18 1150 12/24/18 1633 12/24/18 2053 12/25/18 0821 12/25/18 1153  GLUCAP 223* 120* 181* 153* 204*   Lipid Profile: No results for input(s): CHOL, HDL, LDLCALC, TRIG, CHOLHDL, LDLDIRECT in the last 72 hours. Thyroid Function Tests: No results for input(s): TSH, T4TOTAL, FREET4, T3FREE, THYROIDAB in the last 72 hours. Anemia Panel: No results for input(s): VITAMINB12, FOLATE, FERRITIN, TIBC, IRON, RETICCTPCT in the last 72 hours. Urine analysis:    Component Value Date/Time   COLORURINE YELLOW 12/25/2018 1053   APPEARANCEUR CLOUDY (A) 12/25/2018 1053   LABSPEC 1.014 12/25/2018 1053   PHURINE 5.0 12/25/2018 1053   GLUCOSEU NEGATIVE 12/25/2018 1053   HGBUR NEGATIVE 12/25/2018 1053   BILIRUBINUR NEGATIVE 12/25/2018 1053   KETONESUR NEGATIVE 12/25/2018 1053   PROTEINUR >=300 (A) 12/25/2018 1053   NITRITE NEGATIVE 12/25/2018 1053   LEUKOCYTESUR LARGE (A) 12/25/2018 1053   Sepsis Labs: Invalid input(s): PROCALCITONIN, LACTICIDVEN  Recent Results (from the past 240 hour(s))  Culture, Urine     Status: Abnormal   Collection Time: 12/16/18  7:35 AM   Specimen: Urine, Catheterized  Result Value Ref Range Status   Specimen Description   Final    URINE, CATHETERIZED Performed at [facility], [address], [place], [place] [address]    Special Requests   Final    NONE Performed at [facility], [address] [place], [place] [address]    Culture (A)  Final    >=100,000 COLONIES/mL ESCHERICHIA COLI 30,000 COLONIES/mL VANCOMYCIN RESISTANT ENTEROCOCCUS ISOLATED    Report Status  12/21/2018 FINAL  Final   Organism ID, Bacteria ESCHERICHIA COLI (A)  Final   Organism ID, Bacteria VANCOMYCIN RESISTANT ENTEROCOCCUS ISOLATED (A)  Final      Susceptibility   Escherichia coli - MIC*    AMPICILLIN <=2 SENSITIVE Sensitive     CEFAZOLIN <=4 SENSITIVE Sensitive     CEFTRIAXONE <=1 SENSITIVE Sensitive     CIPROFLOXACIN <=0.25 SENSITIVE Sensitive     GENTAMICIN <=1 SENSITIVE Sensitive  IMIPENEM <=0.25 SENSITIVE Sensitive     NITROFURANTOIN <=16 SENSITIVE Sensitive     TRIMETH/SULFA <=20 SENSITIVE Sensitive     AMPICILLIN/SULBACTAM <=2 SENSITIVE Sensitive     PIP/TAZO <=4 SENSITIVE Sensitive     * >=100,000 COLONIES/mL ESCHERICHIA COLI   Vancomycin resistant enterococcus isolated - MIC*    AMPICILLIN <=2 SENSITIVE Sensitive     NITROFURANTOIN <=16 SENSITIVE Sensitive     VANCOMYCIN >=32 RESISTANT Resistant     LINEZOLID 2 SENSITIVE Sensitive     * 30,000 COLONIES/mL VANCOMYCIN RESISTANT ENTEROCOCCUS ISOLATED  SARS CORONAVIRUS 2 (TAT 6-24 HRS) Nasopharyngeal Nasopharyngeal Swab     Status: None   Collection Time: 12/24/18 12:23 PM   Specimen: Nasopharyngeal Swab  Result Value Ref Range Status   SARS Coronavirus 2 NEGATIVE NEGATIVE Final    Comment: (NOTE) SARS-CoV-2 target nucleic acids are NOT DETECTED. The SARS-CoV-2 RNA is generally detectable in upper and lower respiratory specimens during the acute phase of infection. Negative results do not preclude SARS-CoV-2 infection, do not rule out co-infections with other pathogens, and should not be used as the sole basis for treatment or other patient management decisions. Negative results must be combined with clinical observations, patient history, and epidemiological information. The expected result is Negative. Fact Sheet for Patients: HairSlick.no Fact Sheet for Healthcare Providers: quierodirigir.com This test is not yet approved or cleared by the [place] FDA and  has been authorized for detection and/or diagnosis of SARS-CoV-2 by FDA under an Emergency Use Authorization (EUA). This EUA will remain  in effect (meaning this test can be used) for the duration of the COVID-19 declaration under Section 56 4(b)(1) of the Act, 21 U.S.C. section 360bbb-3(b)(1), unless the authorization is terminated or revoked sooner. Performed at [facility], [address] [place], [place] [address]       Radiology Studies: No results found.   Pamella Pert, MD, PhD [facility]  Contact via  [email or web address]  [facility] Office Info P: [phone] F: [phone]

## 2018-12-25 NOTE — Plan of Care (Signed)
  Problem: Education: Goal: Knowledge of risk factors and measures for prevention of condition will improve Outcome: Progressing   Problem: Coping: Goal: Psychosocial and spiritual needs will be supported Outcome: Progressing   Problem: Respiratory: Goal: Will maintain a patent airway Outcome: Progressing Goal: Complications related to the disease process, condition or treatment will be avoided or minimized Outcome: Progressing   Problem: Education: Goal: Knowledge of disease or condition will improve Outcome: Progressing Goal: Knowledge of the prescribed therapeutic regimen will improve Outcome: Progressing Goal: Individualized Educational Video(s) Outcome: Progressing   Problem: Activity: Goal: Ability to tolerate increased activity will improve Outcome: Progressing Goal: Will verbalize the importance of balancing activity with adequate rest periods Outcome: Progressing   Problem: Respiratory: Goal: Ability to maintain a clear airway will improve Outcome: Progressing Goal: Levels of oxygenation will improve Outcome: Progressing Goal: Ability to maintain adequate ventilation will improve Outcome: Progressing   Problem: Education: Goal: Knowledge of General Education information will improve Description: Including pain rating scale, medication(s)/side effects and non-pharmacologic comfort measures Outcome: Progressing   Problem: Health Behavior/Discharge Planning: Goal: Ability to manage health-related needs will improve Outcome: Progressing   Problem: Clinical Measurements: Goal: Ability to maintain clinical measurements within normal limits will improve Outcome: Progressing Goal: Will remain free from infection Outcome: Progressing Goal: Diagnostic test results will improve Outcome: Progressing Goal: Respiratory complications will improve Outcome: Progressing Goal: Cardiovascular complication will be avoided Outcome: Progressing   Problem: Activity: Goal:  Risk for activity intolerance will decrease Outcome: Progressing   Problem: Nutrition: Goal: Adequate nutrition will be maintained Outcome: Progressing   Problem: Coping: Goal: Level of anxiety will decrease Outcome: Progressing   Problem: Elimination: Goal: Will not experience complications related to bowel motility Outcome: Progressing Goal: Will not experience complications related to urinary retention Outcome: Progressing   Problem: Pain Managment: Goal: General experience of comfort will improve Outcome: Progressing   Problem: Safety: Goal: Ability to remain free from injury will improve Outcome: Progressing   Problem: Skin Integrity: Goal: Risk for impaired skin integrity will decrease Outcome: Progressing   Problem: Education: Goal: Knowledge of General Education information will improve Description: Including pain rating scale, medication(s)/side effects and non-pharmacologic comfort measures Outcome: Progressing   Problem: Health Behavior/Discharge Planning: Goal: Ability to manage health-related needs will improve Outcome: Progressing   Problem: Clinical Measurements: Goal: Ability to maintain clinical measurements within normal limits will improve Outcome: Progressing Goal: Will remain free from infection Outcome: Progressing Goal: Diagnostic test results will improve Outcome: Progressing Goal: Respiratory complications will improve Outcome: Progressing Goal: Cardiovascular complication will be avoided Outcome: Progressing   Problem: Activity: Goal: Risk for activity intolerance will decrease Outcome: Progressing   Problem: Nutrition: Goal: Adequate nutrition will be maintained Outcome: Progressing   Problem: Coping: Goal: Level of anxiety will decrease Outcome: Progressing   Problem: Elimination: Goal: Will not experience complications related to bowel motility Outcome: Progressing Goal: Will not experience complications related to urinary  retention Outcome: Progressing   Problem: Pain Managment: Goal: General experience of comfort will improve Outcome: Progressing   Problem: Safety: Goal: Ability to remain free from injury will improve Outcome: Progressing   Problem: Skin Integrity: Goal: Risk for impaired skin integrity will decrease Outcome: Progressing

## 2018-12-26 LAB — CBC
HCT: 26.9 % — ABNORMAL LOW (ref 36.0–46.0)
Hemoglobin: 8.6 g/dL — ABNORMAL LOW (ref 12.0–15.0)
MCH: 29.6 pg (ref 26.0–34.0)
MCHC: 32 g/dL (ref 30.0–36.0)
MCV: 92.4 fL (ref 80.0–100.0)
Platelets: 107 10*3/uL — ABNORMAL LOW (ref 150–400)
RBC: 2.91 MIL/uL — ABNORMAL LOW (ref 3.87–5.11)
RDW: 18.6 % — ABNORMAL HIGH (ref 11.5–15.5)
WBC: 7.8 10*3/uL (ref 4.0–10.5)
nRBC: 0 % (ref 0.0–0.2)

## 2018-12-26 LAB — BASIC METABOLIC PANEL
Anion gap: 8 (ref 5–15)
BUN: 66 mg/dL — ABNORMAL HIGH (ref 6–20)
CO2: 24 mmol/L (ref 22–32)
Calcium: 9.1 mg/dL (ref 8.9–10.3)
Chloride: 105 mmol/L (ref 98–111)
Creatinine, Ser: 1.28 mg/dL — ABNORMAL HIGH (ref 0.44–1.00)
GFR calc Af Amer: 53 mL/min — ABNORMAL LOW (ref >60)
GFR calc non Af Amer: 46 mL/min — ABNORMAL LOW (ref >60)
Glucose, Bld: 162 mg/dL — ABNORMAL HIGH (ref 70–99)
Potassium: 4.7 mmol/L (ref 3.5–5.1)
Sodium: 137 mmol/L (ref 135–145)

## 2018-12-26 LAB — GLUCOSE, CAPILLARY
Glucose-Capillary: 142 mg/dL — ABNORMAL HIGH (ref 70–99)
Glucose-Capillary: 166 mg/dL — ABNORMAL HIGH (ref 70–99)
Glucose-Capillary: 205 mg/dL — ABNORMAL HIGH (ref 70–99)

## 2018-12-26 LAB — TYPE AND SCREEN
ABO/RH(D): O NEG
Antibody Screen: NEGATIVE
Unit division: 0

## 2018-12-26 LAB — BPAM RBC
Blood Product Expiration Date: 202101022359
ISSUE DATE / TIME: 202012221501
Unit Type and Rh: 9500

## 2018-12-26 LAB — MAGNESIUM: Magnesium: 2.4 mg/dL (ref 1.7–2.4)

## 2018-12-26 MED ORDER — POLYETHYLENE GLYCOL 3350 17 G PO PACK: 17.0000 g | PACK | Freq: Two times a day (BID) | ORAL | 0 refills | Status: AC | PRN

## 2018-12-26 MED ORDER — AMOXICILLIN-POT CLAVULANATE 875-125 MG PO TABS: 1.0000 | ORAL_TABLET | Freq: Two times a day (BID) | ORAL | 0 refills | Status: AC

## 2018-12-26 MED ORDER — ZINC SULFATE 220 (50 ZN) MG PO CAPS: 220.0000 mg | ORAL_CAPSULE | Freq: Every day | ORAL | 0 refills | Status: AC

## 2018-12-26 MED ORDER — ALBUTEROL SULFATE HFA 108 (90 BASE) MCG/ACT IN AERS: 2.0000 | INHALATION_SPRAY | RESPIRATORY_TRACT | Status: AC | PRN

## 2018-12-26 MED ORDER — GUAIFENESIN-DM 100-10 MG/5ML PO SYRP: 10.0000 mL | ORAL_SOLUTION | ORAL | 0 refills | Status: AC | PRN

## 2018-12-26 NOTE — Progress Notes (Signed)
OT Cancellation Note  Patient Details Name: Martha Carter MRN: [id number] DOB: 1960-10-13   Cancelled Treatment:    Reason Eval/Treat Not Completed: Patient declined, no reason specified  Pt stated upon OT entering the room she was not doing therapy. She was playing games on her phone.  Explained benefit and pt declined.  [name], OT Acute Rehabilitation Services [phone] Office- [phone], Edwena Felty D 12/26/2018, 1:15 PM

## 2018-12-26 NOTE — Progress Notes (Signed)
Report called to Mikki Santee at [facility] [phone]. SRP, RN

## 2018-12-26 NOTE — Progress Notes (Signed)
Pt discharged to [facility] via EMS, pt stable, personal belongings with pt. Pt glasses were taped as pt lens was broken prior to admission. SRP,RN

## 2018-12-26 NOTE — Discharge Summary (Signed)
Physician Discharge Summary   Patient ID: Martha Carter MRN: [id number] DOB/AGE: Nov 15, 1960 58 y.o.  Admit date: 12/15/2018 Discharge date: 12/26/2018  Primary Care Physician:  System, Pcp Not In   Recommendations for Outpatient Follow-up:  Follow up with PCP in 1-2 weeks Please follow renal function in 1 week  Home Health: Patient going to skilled nursing facility Equipment/Devices:   Discharge Condition: stable  CODE STATUS: FULL    Diet recommendation: Carb modified diet   Discharge Diagnoses:    . Acute respiratory disease due to COVID-19 virus . AF (paroxysmal atrial fibrillation) ([place]) . Coronary artery disease Acute on chronic diastolic CHF Acute on chronic CKD stage III Anemia of chronic disease/thrombocytopenia E. coli and Enterococcus faecalis UTI Paroxysmal atrial fibrillation Uncontrolled diabetes mellitus type 2 with hyperglycemia CAD/CABG Essential hypertension Anxiety/depression GERD Constipation   Consults: None    Allergies:   Allergies  Allergen Reactions  . Exenatide Itching and Swelling  . Celecoxib Other (See Comments)    Per MAR  . Sulfamethoxazole Other (See Comments)    Doesn't know reaction - child       . Ace Inhibitors Cough  . Ceftriaxone Nausea And Vomiting  . Clindamycin Nausea Only  . Doxycycline Nausea Only  . Hydromorphone Nausea And Vomiting  . Latex Itching  . Metoclopramide Nausea And Vomiting  . Morphine Nausea And Vomiting  . Tape Itching    Blisters Adhesive tape; patient prefers paper tape       DISCHARGE MEDICATIONS: Allergies as of 12/26/2018      Reactions   Exenatide Itching, Swelling   Celecoxib Other (See Comments)   Per MAR   Sulfamethoxazole Other (See Comments)   Doesn't know reaction - child       Ace Inhibitors Cough   Ceftriaxone Nausea And Vomiting   Clindamycin Nausea Only   Doxycycline Nausea Only   Hydromorphone Nausea And Vomiting   Latex Itching   Metoclopramide Nausea  And Vomiting   Morphine Nausea And Vomiting   Tape Itching   Blisters Adhesive tape; patient prefers paper tape      Medication List    STOP taking these medications   arformoterol 15 MCG/2ML Nebu Commonly known as: BROVANA   bumetanide 2 MG tablet Commonly known as: BUMEX     TAKE these medications   acetaminophen 325 MG tablet Commonly known as: TYLENOL Place 650 mg into feeding tube every 6 (six) hours as needed for fever.   ADVAIR DISKUS IN Inhale 1 puff into the lungs every 12 (twelve) hours.   albuterol 108 (90 Base) MCG/ACT inhaler Commonly known as: VENTOLIN HFA Inhale 2 puffs into the lungs every 4 (four) hours as needed for wheezing or shortness of breath.   amoxicillin-clavulanate 875-125 MG tablet Commonly known as: AUGMENTIN Take 1 tablet by mouth 2 (two) times daily for 6 days.   aspirin 81 MG chewable tablet Place 81 mg into feeding tube daily.   atorvastatin 40 MG tablet Commonly known as: LIPITOR Take 40 mg by mouth daily.   bisacodyl 10 MG/30ML Enem Commonly known as: FLEET Place 10 mg rectally daily as needed (constipation).   bisacodyl 5 MG EC tablet Commonly known as: DULCOLAX Take 5 mg by mouth 2 (two) times daily.   carvedilol 25 MG tablet Commonly known as: COREG Take 25 mg by mouth 2 (two) times daily with a meal.   cloNIDine 0.1 MG tablet Commonly known as: CATAPRES Take 0.1 mg by mouth every 12 (twelve) hours as needed (  hypertension).   Eliquis 2.5 MG Tabs tablet Generic drug: apixaban Take 2.5 mg by mouth 2 (two) times daily.   folic acid 1 MG tablet Commonly known as: FOLVITE Take 1 mg by mouth daily.   furosemide 20 MG tablet Commonly known as: LASIX Take 20 mg by mouth daily.   gabapentin 100 MG capsule Commonly known as: NEURONTIN Take 200 mg by mouth 3 (three) times daily.   guaiFENesin-dextromethorphan 100-10 MG/5ML syrup Commonly known as: ROBITUSSIN DM Take 10 mLs by mouth every 4 (four) hours as needed for  cough.   hydrALAZINE 25 MG tablet Commonly known as: APRESOLINE Take 75 mg by mouth every 8 (eight) hours.   hydrOXYzine 25 MG tablet Commonly known as: ATARAX/VISTARIL Take 25 mg by mouth 3 (three) times daily as needed for anxiety. What changed: Another medication with the same name was removed. Continue taking this medication, and follow the directions you see here.   insulin lispro 100 UNIT/ML injection Commonly known as: HUMALOG Inject 0-10 Units into the skin 3 (three) times daily before meals. 201-250 give 4 units  251-300 give 6 units 301-350 give 8 units 351-400 give 10 units >400 give 16 units, call MD and obtain stat lab verification.   iron polysaccharides 150 MG capsule Commonly known as: NIFEREX Take 150 mg by mouth daily.   isosorbide mononitrate 120 MG 24 hr tablet Commonly known as: IMDUR Take 120 mg by mouth daily.   Melatonin 3 MG Tabs Take 3 mg by mouth at bedtime.   multivitamin with minerals Tabs tablet Place 1 tablet into feeding tube daily.   nitroGLYCERIN 0.4 MG SL tablet Commonly known as: NITROSTAT Place 0.4 mg under the tongue every 5 (five) minutes as needed for chest pain.   ondansetron 4 MG tablet Commonly known as: ZOFRAN Place 4 mg into feeding tube every 6 (six) hours as needed for nausea or vomiting.   pantoprazole 40 MG tablet Commonly known as: PROTONIX Take 40 mg by mouth daily.   polyethylene glycol 17 g packet Commonly known as: MIRALAX / GLYCOLAX Take 17 g by mouth 2 (two) times daily as needed for moderate constipation.   polyvinyl alcohol 1.4 % ophthalmic solution Commonly known as: LIQUIFILM TEARS Place 2 drops into both eyes 4 (four) times daily as needed for dry eyes.   senna 8.6 MG tablet Commonly known as: SENOKOT Take 2 tablets by mouth daily as needed for constipation.   sertraline 100 MG tablet Commonly known as: ZOLOFT Take 100 mg by mouth daily.   sucralfate 1 g tablet Commonly known as: CARAFATE Take  1 g by mouth 3 (three) times daily.   Toujeo SoloStar 300 UNIT/ML Sopn Generic drug: Insulin Glargine (1 Unit Dial) Inject 50 Units into the skin daily.   vitamin B-12 1000 MCG tablet Commonly known as: CYANOCOBALAMIN Take 1,000 mcg by mouth daily.   vitamin C 500 MG tablet Commonly known as: ASCORBIC ACID Take 500 mg by mouth daily.   Vitamin D 50 MCG (2000 UT) Caps Take 2,000 Units by mouth daily.   zinc sulfate 220 (50 Zn) MG capsule Take 1 capsule (220 mg total) by mouth daily. Start taking on: December 27, 2018        Brief H and P: For complete details please refer to admission H and P, but in brief 58 year old female with history of diastolic CHF, COPD and chronic RF on 3 to 5 L, CAD/CABG, paroxysmal A. fib on Eliquis, CKD-2, anemia of chronic disease, IDDM-2,  PAD/right BKA, HTN, anxiety and depression are recent diagnosis was COVID-19 on 12/12/2018 presenting with shortness of breath, productive cough and lower extremity edema. Tested positive for Covid on 12/12/2018 to [facility] ED. CXR suggestive for mild interstitial edema. CTA negative for PE but concerning for CHF, hepatomegaly and splenomegaly. Received IV Decadron 10 mg and albuterol inhalers and she was transferred from his usual NF to [facility].   Hospital Course:     Acute respiratory disease due to COVID-19 virus - normally patient is on O2 3 to 5 L nasal cannula -She finished a course of remdesivir and Decadron.  -Supportive care, monitor inflammatory markers, stable -Repeat test for SNF 12/21 with SARS-CoV-2 negative  COVID-19 Labs  Recent Labs (last 2 labs)        Recent Labs    12/23/18 0234 12/24/18 0242 12/25/18 0320  DDIMER 0.98* 0.72* 0.80*  LDH 182 161 146  CRP 0.6 0.8 1.3*      Recent Labs       Lab Results  Component Value Date   SARSCOV2NAA NEGATIVE 12/24/2018         Acute on chronic diastolic CHF -Currently compensated  -2D echo in 08/2018 had shown EF of 55 to  65% with grade 1 diastolic dysfunction  -Patient was started on IV Lasix for several days, creatinine trended up to 1.4, now decreasing to 1.2.  -Continue home dose of Lasix 20 mg daily, hold off on Bumex.  Follow renal function closely.  Creatinine 1.28 at the time of discharge  Acute kidney injury on CKD stage IIIa -Creatinine now improving, 1.28 at the time of discharge. -Ultrasound without significant findings   Anemia of chronic disease/thrombocytopenia -Hemoglobin 7.2 on 12/22, received 1 units packed RBC transfusion. -Hemoglobin 8.6 at the time of discharge, platelets 107K -Patient follows hematology outpatient, occasionally has blood transfusions.  She missed her appointment with hematology last week due to Covid, will need to reschedule and follow-up CBC    E. coli and Enterococcus faecalis UTI Patient had been on ceftriaxone and amoxicillin.  She was monitored off antibiotics however on 12/22 started having dysuria again.  Repeat UA showed evidence of UTI, placed on Augmentin.   Paroxysmal A. fib -Continue beta-blockers Eliquis  Uncontrolled type 2 diabetes mellitus with hyperglycemia -Continue sliding scale, continue Tujeo  Coronary artery disease with history of CABG -Currently no acute issues, no chest pain  PAD/right BKA  Essential hypertension -BP currently stable, continue Imdur, beta-blocker, hydralazine, clonidine as needed  Anxiety/depression/insomnia:  -Continue gabapentin, Zoloft, Atarax, melatonin  GERD -Continue PPI, Carafate   Constipation: Chronic issue. Has history of gastroparesis. Reports intolerance to Reglan. -Senokot-S and MiraLAX    Day of Discharge S: No acute complaints, playing card game on her phone.  Motivated to start rehab at the facility.  No fevers or chills.  BP (!) 167/71 (BP Location: Left Arm)   Pulse 80   Temp 98.4 F (36.9 C) (Oral)   Resp 20   Ht  (1.575 m)   Wt 85.2 kg   SpO2 99%   BMI 34.35  kg/m   Physical Exam: General: Alert and awake oriented x3 not in any acute distress. HEENT: CVS: S1-S2 clear no murmur rubs or gallops Chest: clear to auscultation bilaterally, no wheezing rales or rhonchi Abdomen: soft nontender, nondistended, normal bowel sounds Extremities: no cyanosis, clubbing or edema noted bilaterally Neuro: No new FND's   The results of significant diagnostics from this hospitalization (including imaging, microbiology, ancillary and  laboratory) are listed below for reference.      Procedures/Studies:  US Renal  Result Date: 12/19/2018 CLINICAL DATA:  Acute kidney injury EXAM: RENAL / URINARY TRACT ULTRASOUND COMPLETE COMPARISON:  CT abdomen pelvis 07/26/2017 FINDINGS: Right Kidney: Renal measurements: 12.3 x 5.4 x 5.7 cm = volume: 216.4 mL . Echogenicity within normal limits. No mass or hydronephrosis visualized. Left Kidney: Renal measurements: 11.5 x 5.5 x 4.6 cm = volume: 153 mL. Echogenicity within normal limits. No mass or hydronephrosis visualized. Bladder: Appears normal for degree of bladder distention. Bilateral bladder jets are identified. Other: Incidental note made of an enlarged spleen measuring 15.9 cm in length with an estimated splenic volume of 686 mL. IMPRESSION: Unremarkable renal ultrasound. Incidentally noted splenomegaly. Electronically Signed   By: Kreg ShropshirePrice  DeHay M.D.   On: 12/19/2018 17:45   CXR portable  Result Date: 12/20/2018 CLINICAL DATA:  Shortness of breath, weakness, COVID-19 positive EXAM: PORTABLE CHEST 1 VIEW COMPARISON:  Portable exam 1041 hours compared to 12/15/2018 FINDINGS: Kyphotic positioning with rotation to the RIGHT. Enlargement of cardiac silhouette post median sternotomy. Stable mediastinal contours. BILATERAL patchy pulmonary infiltrates which could represent multifocal pneumonia or pulmonary edema. No pleural effusion or pneumothorax. Bones demineralized. IMPRESSION: BILATERAL pulmonary infiltrates question multifocal  pneumonia versus edema. Electronically Signed   By: Ulyses SouthwardMark  Boles M.D.   On: 12/20/2018 11:00   DG Chest Port 1 View  Result Date: 12/15/2018 CLINICAL DATA:  COVID positive.  No complaints. EXAM: PORTABLE CHEST 1 VIEW COMPARISON:  08/09/2017. FINDINGS: Hazy opacity noted in the left mid lung. More subtle hazy opacity suggested in the right mid to lower lung. There are prominent bronchovascular markings bilaterally. No convincing pleural effusion.  Pneumothorax. Stable changes prior cardiac surgery. Cardiac silhouette enlarged. No mediastinal or hilar masses. IMPRESSION: 1. Subtle hazy airspace opacity in mid to lower lung and possibly in the right mid to lower lung as well. Study limited by the semi-erect AP portable technique. Findings consistent multifocal infection in the clinical setting. Electronically Signed   By: Amie Portlandavid  Ormond M.D.   On: 12/15/2018 20:39       LAB RESULTS: Basic Metabolic Panel: Recent Labs  Lab 12/20/18 0305 12/21/18 0256 12/25/18 0320 12/26/18 0242  NA 136  --  139 137  K 4.8  --  5.0 4.7  CL 105  --  106 105  CO2 20*  --  25 24  GLUCOSE 134*  --  189* 162*  BUN 67*  --  66* 66*  CREATININE 1.70*  --  1.42* 1.28*  CALCIUM 9.1  --  9.5 9.1  MG 2.2   < > 2.4 2.4  PHOS 3.0  --   --   --    < > = values in this interval not displayed.   Liver Function Tests: Recent Labs  Lab 12/24/18 0242 12/25/18 0320  AST 22 22  ALT 29 34  ALKPHOS 86 86  BILITOT 0.8 0.7  PROT 5.3* 5.5*  ALBUMIN 2.9* 2.6*   No results for input(s): LIPASE, AMYLASE in the last 168 hours. No results for input(s): AMMONIA in the last 168 hours. CBC: Recent Labs  Lab 12/25/18 0320 12/26/18 0242  WBC 8.5 7.8  NEUTROABS 8.1*  --   HGB 7.2* 8.6*  HCT 23.5* 26.9*  MCV 97.1 92.4  PLT 117* 107*   Cardiac Enzymes: No results for input(s): CKTOTAL, CKMB, CKMBINDEX, TROPONINI in the last 168 hours. BNP: Invalid input(s): POCBNP CBG: Recent Labs  Lab 12/26/18 0815  12/26/18 1227   GLUCAP 142* 166*      Disposition and Follow-up: Discharge Instructions    Diet Carb Modified   Complete by: As directed    Increase activity slowly   Complete by: As directed        DISPOSITION: Skilled nursing facility   DISCHARGE FOLLOW-UP    Time coordinating discharge:  35 minutes  Signed:   Thad Ranger M.D. [facility] 12/26/2018, 1:59 PM

## 2018-12-26 NOTE — TOC Transition Note (Signed)
Transition of Care [facility]) - CM/SW Discharge Note   Patient Details  Name: Martha Carter MRN: [id number] Date of Birth: 07-05-1960  [facility] of [facility] CM/SW Contact:  Wende Neighbors, LCSW Phone Number: 12/26/2018, 3:48 PM   Clinical Narrative:  Patient to go back to [facility] via [place]. Facility has stated that they will pay for patients transport back to [facility]. RN to call [phone] and ask for yellow hall nurse (rm#[id number]) for report      Final next level of care: Skilled Nursing Facility Barriers to Discharge: No Barriers Identified   Patient Goals and CMS Choice Patient states their goals for this hospitalization and ongoing recovery are:: Pt would like to go to a different SNF.   Choice offered to / list presented to : Patient  Discharge Placement              Patient chooses bed at: ([facility]) Patient to be transferred to facility by: ptar Name of family member notified: patient is cognitive Patient and family notified of of transfer: 12/26/18  Discharge Plan and Services                                     Social Determinants of Health (SDOH) Interventions     Readmission Risk Interventions No flowsheet data found.

## 2018-12-26 NOTE — TOC Progression Note (Signed)
Transition of Care [facility]) - Progression Note    Patient Details  Name: Martha Carter MRN: [id number] Date of Birth: 1960/04/03  Transition of Care [facility]) CM/SW [place], LCSW Phone Number: 12/26/2018, 11:20 AM  Clinical Narrative:   Patient tested negative for covid and stated she would like to return back to [facility]. CSW contacted facility with patients permission and CSW spoke with [facility] (admission coordinator). Rolla Plate stated that patient is able to return with only one negative covid test but stated that they will be unable to provide transportation for patient. Patient will be unable to go via [facility] since patients facility is over 50 miles out. Patient will have to pay out of pocket for [facility] transportation and patient stated she does not have money. Patient stated she does not have family to come and pick her. CSW has contacted higher ups to assist CSW in finding a safe transportation for patient to return back to facility     Expected Discharge Plan: [place] Barriers to Discharge: No Barriers Identified  Expected Discharge Plan and Services Expected Discharge Plan: [place] arrangements for the past 2 months: [place]                                       Social Determinants of Health (SDOH) Interventions    Readmission Risk Interventions No flowsheet data found.

## 2019-02-12 IMAGING — CT CT ABDOMEN W/O CM
2 of 4 series · 15 of 46 positions shown, 17 images · non-contrast
Comparison: 07/25/2017

CLINICAL DATA: Dysphagia, malnutrition, assess for fluoroscopic
gastrostomy insertion

EXAM:
CT ABDOMEN WITHOUT CONTRAST
TECHNIQUE: Multidetector CT imaging of the abdomen was performed following the
standard protocol without IV contrast.

[Series 3: ap without · axial · non-contrast · 0.86mm/px · z∈[+1225,+1455]mm · 12 of 54 slices shown, 14 images]
[im 4/54  soft-tissue]
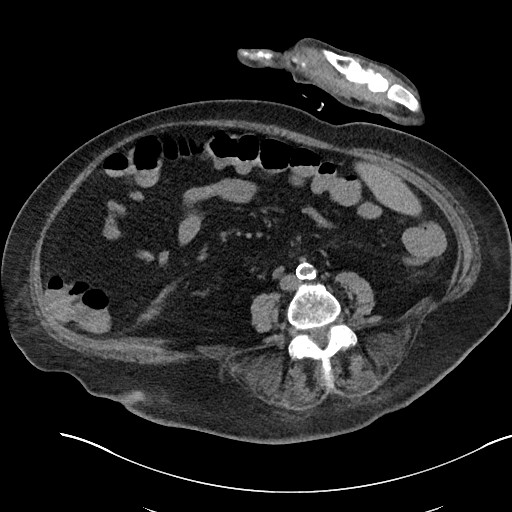
[im 4/54  bone]
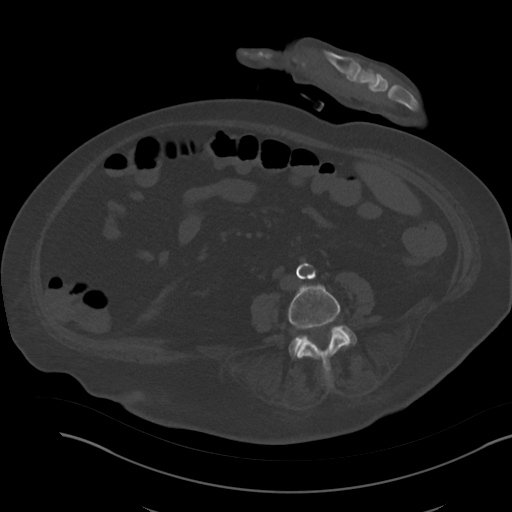
[im 8/54  soft-tissue]
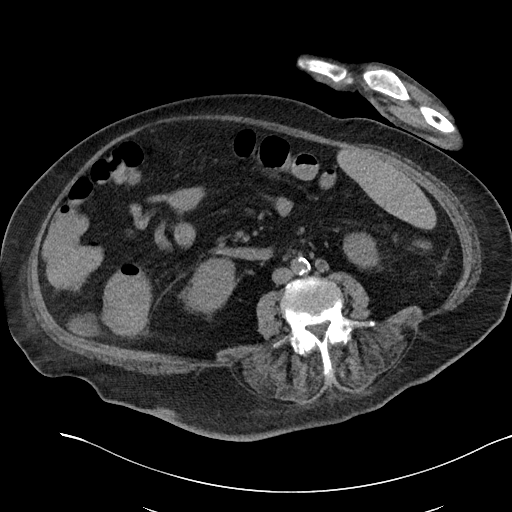
[im 11/54  soft-tissue]
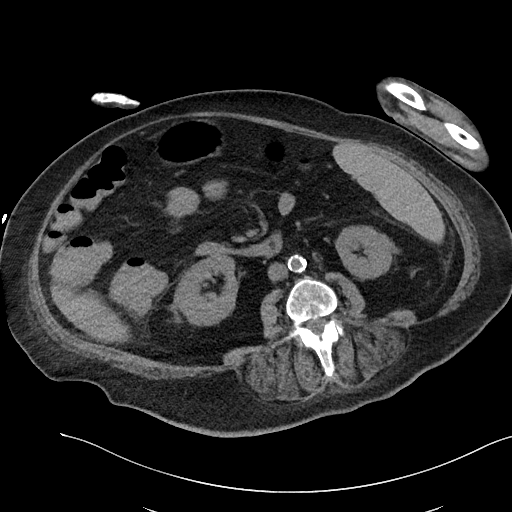
[im 18/54  soft-tissue]
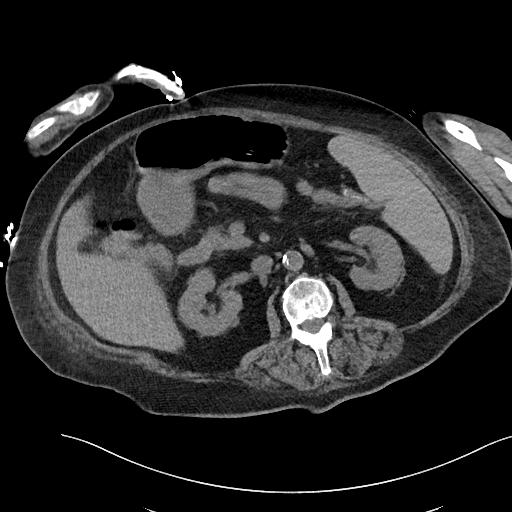
[im 22/54  soft-tissue]
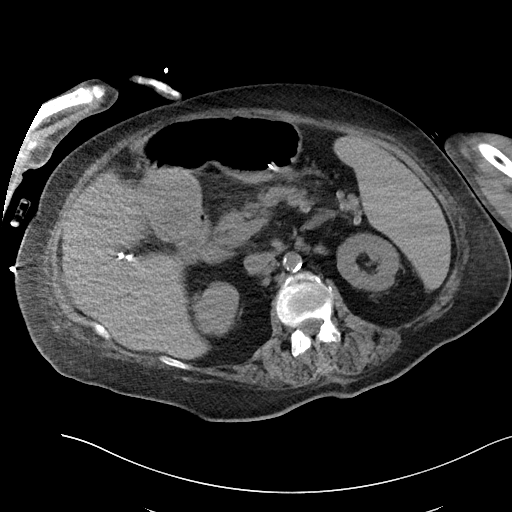
[im 25/54  soft-tissue]
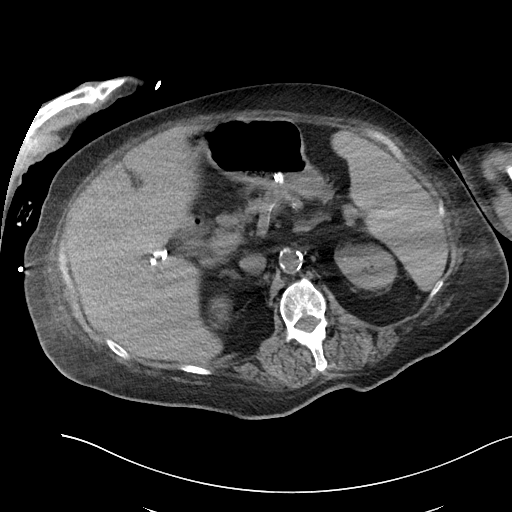
[im 29/54  soft-tissue]
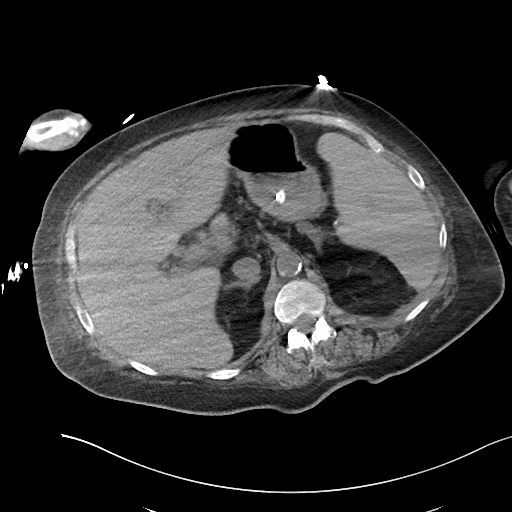
[im 32/54  soft-tissue]
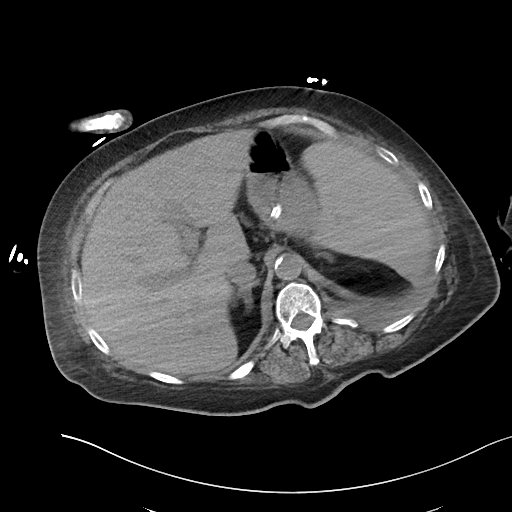
[im 36/54  soft-tissue]
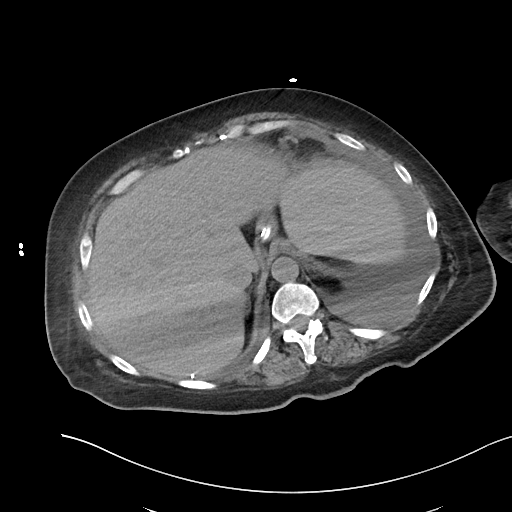
[im 36/54  bone]
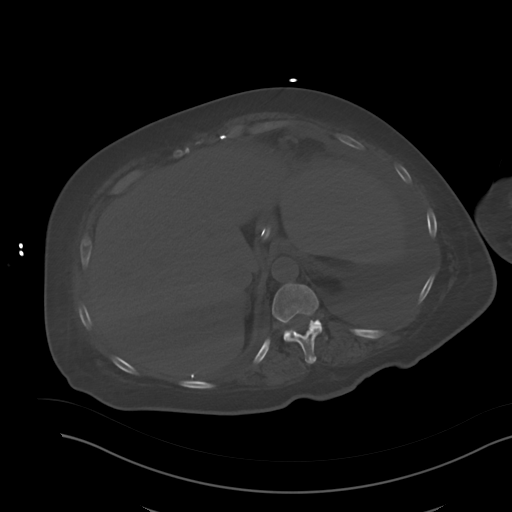
[im 43/54  soft-tissue]
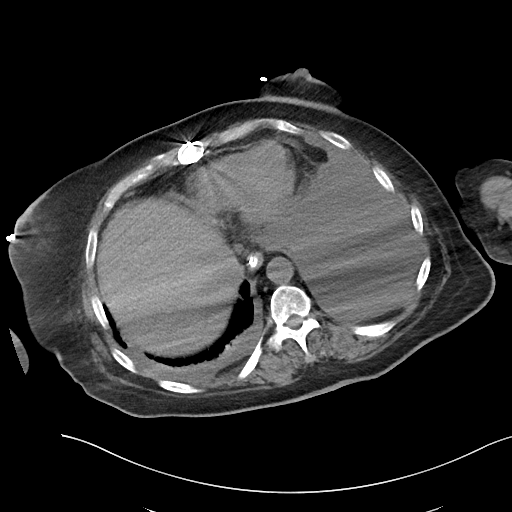
[im 46/54  soft-tissue]
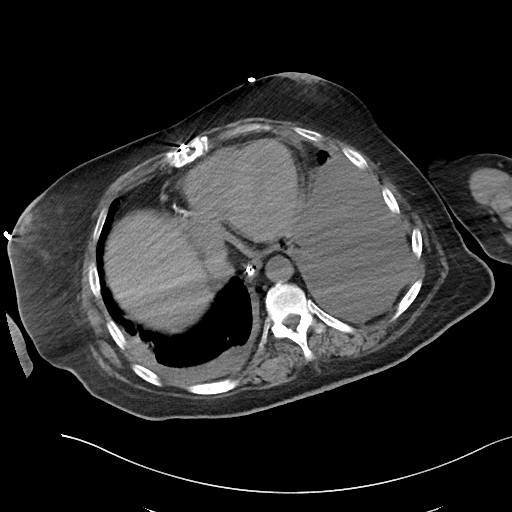
[im 50/54  soft-tissue]
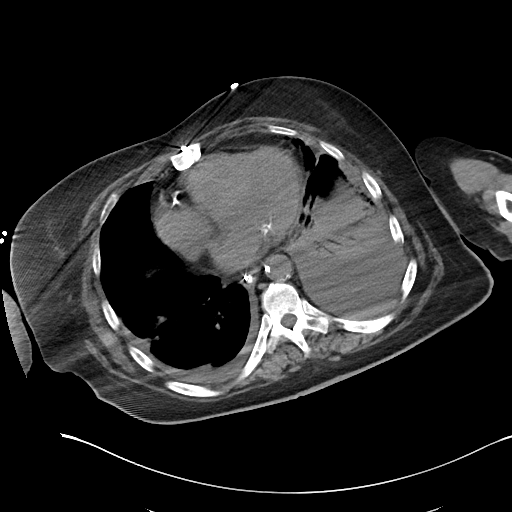

[Series 6: cor · coronal · 0.55mm/px · 3 of 100 slices shown]
[im 34/100  soft-tissue]
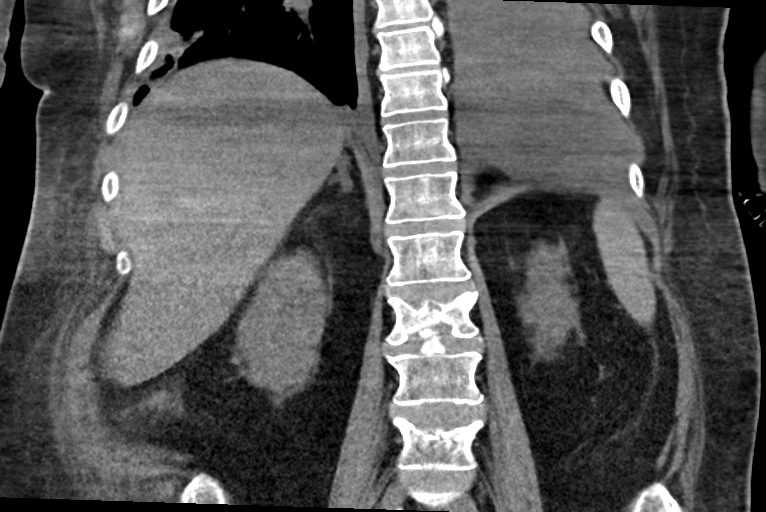
[im 45/100  soft-tissue]
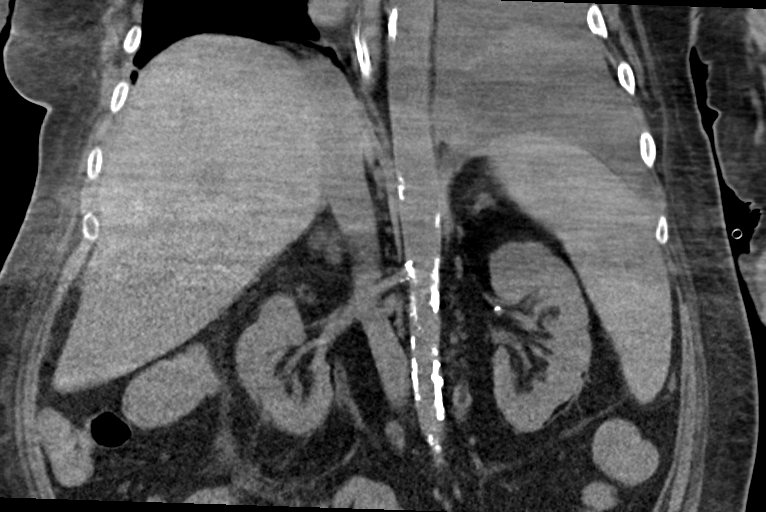
[im 56/100  soft-tissue]
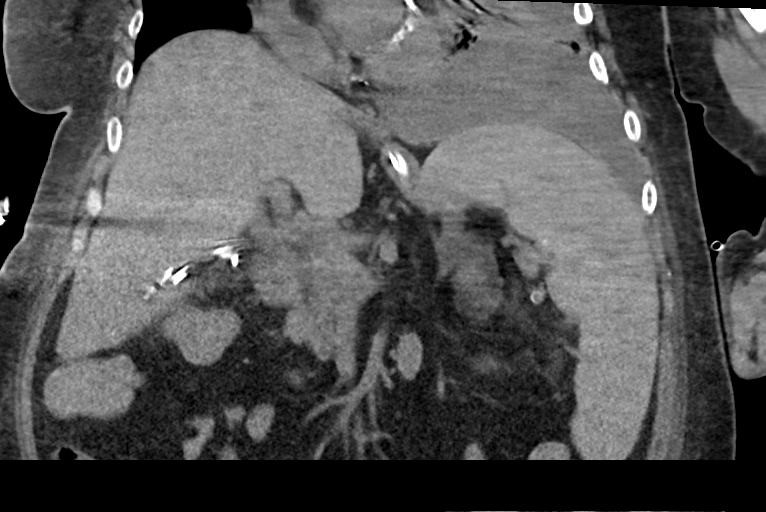

[15 of 46 positions shown; findings below may reference images not displayed]

FINDINGS: Lower chest: Non loculated pleural effusions present bilaterally,
small on the right and larger on the left. There is associated left
lower lobe collapse/consolidation. Minor right base atelectasis.

Previous median sternotomy noted. Mild cardiomegaly. No pericardial
effusion. NG tube within a nondilated esophagus entering the
stomach.

Hepatobiliary: No focal liver abnormality is seen. Status post
cholecystectomy. No biliary dilatation.

Pancreas: Mild atrophy of the pancreas. No ductal dilatation or
focal abnormality. No surrounding inflammatory process or fluid
collection.

Spleen: Splenomegaly noted, measuring 20 cm in length. No focal
splenic lesion.

Adrenals/Urinary Tract: Mild nodular enlargement of the adrenal
glands, nonspecific. Kidneys demonstrate nonspecific peri nephric
strandy edema without obstruction or hydronephrosis. No hydroureter
or obstructing ureteral calculus demonstrated. Bladder not imaged.

Stomach/Bowel: Stomach is anteriorly positioned in the abdomen
inferior to the left hepatic lobe and superior to the colon. Anatomy
appears favorable for fluoroscopic gastrostomy technique. Negative
for bowel obstruction, significant dilatation, ileus, or free air.
No fluid collection or abscess.

Vascular/Lymphatic: Atherosclerosis of aorta. Negative for aneurysm.
No retroperitoneal hemorrhage or hematoma. No adenopathy.

Other: No abdominal wall hernia or abnormality.

Musculoskeletal: Degenerative changes noted of the spine. Chronic
appearing L3 and L5 compression fractures.
IMPRESSION: Stomach anatomy is amenable to fluoroscopic percutaneous gastrostomy
technique.

Negative for significant hiatal hernia, bowel obstruction, or ileus.

Pleural effusions present bilaterally, larger on the left with left
lower lobe collapse/consolidation. Difficult to exclude pneumonia.
Minor right base atelectasis.

Remote cholecystectomy without biliary obstruction

Splenomegaly

Abdominal aortic atherosclerosis without aneurysm

## 2019-08-04 DEATH — deceased

## 2020-07-03 IMAGING — DX DG CHEST 1V PORT
1 series · 1 of 1 positions shown · non-contrast
Comparison: 08/09/2017.

CLINICAL DATA: COVID positive.  No complaints.

EXAM:
PORTABLE CHEST 1 VIEW

[chest ap]
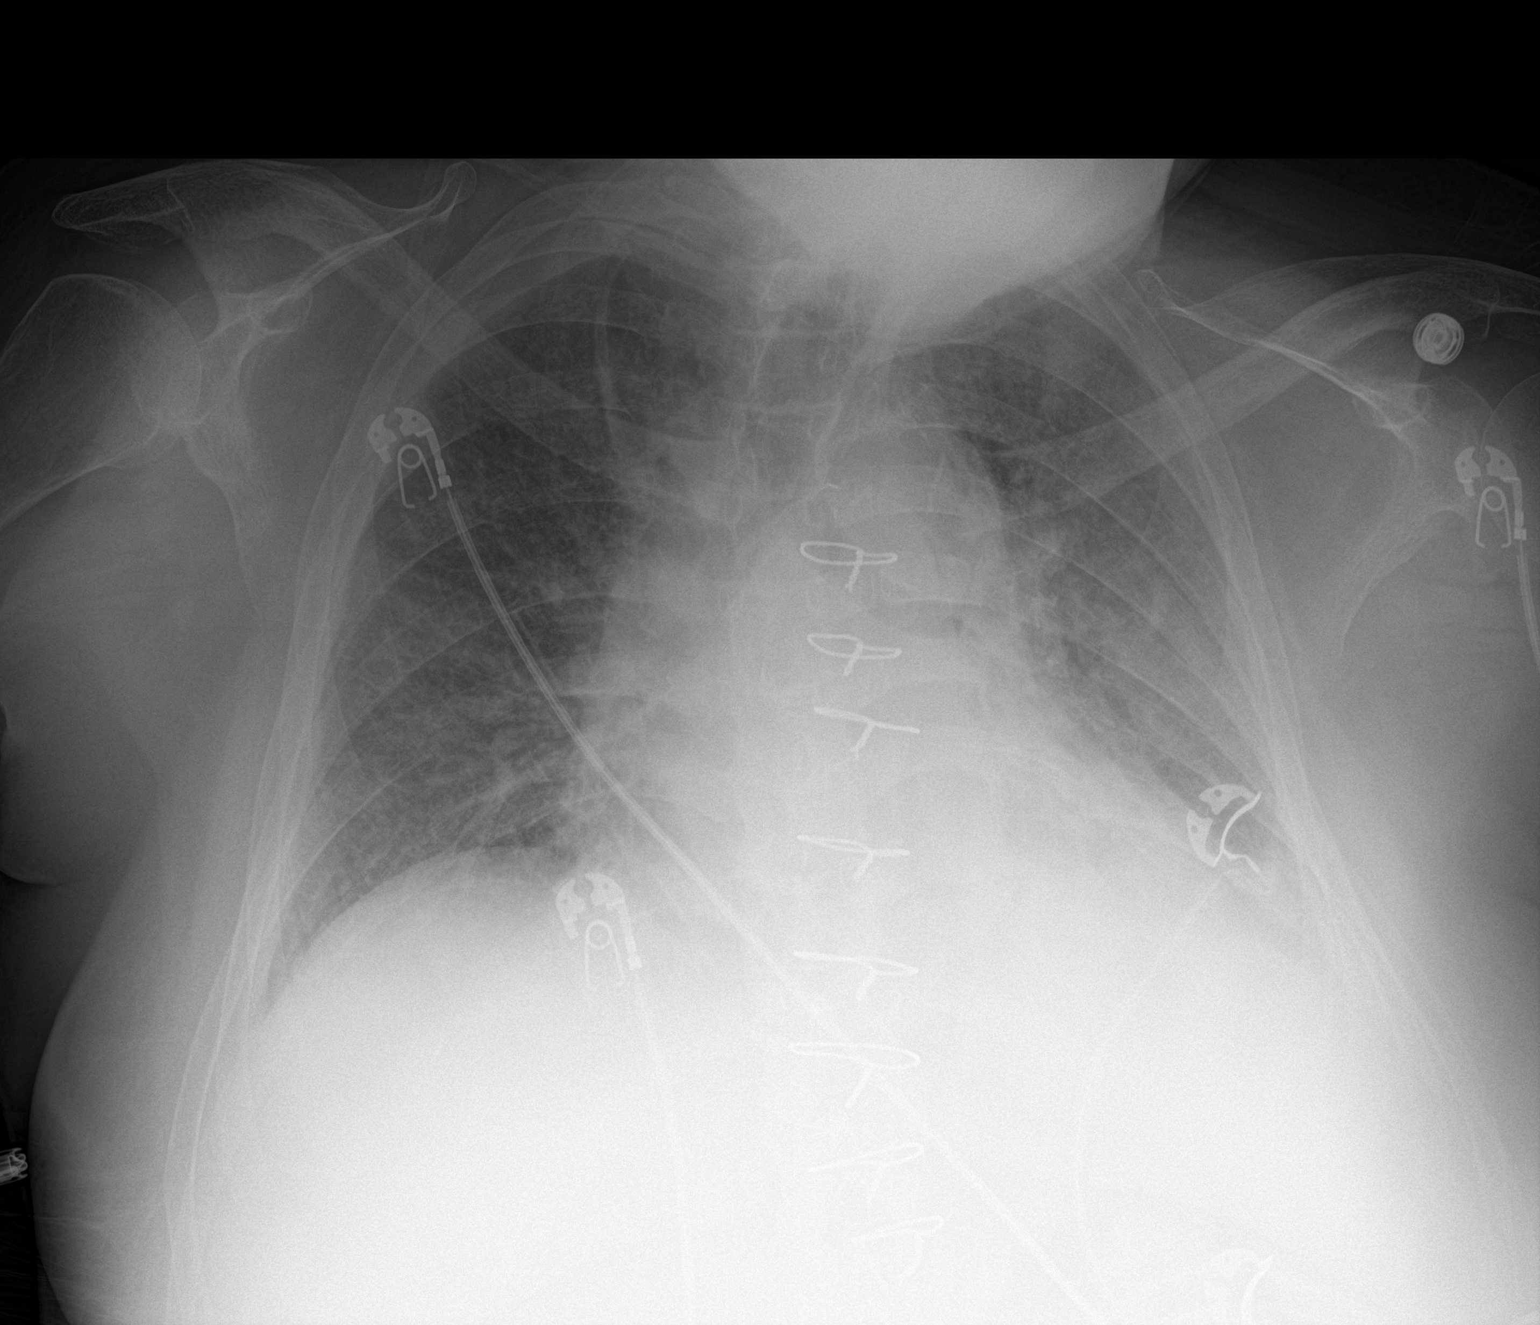

[1 of 1 positions shown; findings below may reference images not displayed]

FINDINGS: Hazy opacity noted in the left mid lung. More subtle hazy opacity
suggested in the right mid to lower lung. There are prominent
bronchovascular markings bilaterally.

No convincing pleural effusion.  Pneumothorax.

Stable changes prior cardiac surgery. Cardiac silhouette enlarged.
No mediastinal or hilar masses.
IMPRESSION: 1. Subtle hazy airspace opacity in mid to lower lung and possibly in
the right mid to lower lung as well. Study limited by the semi-erect
AP portable technique. Findings consistent multifocal infection in
the clinical setting.

## 2020-07-08 IMAGING — DX DG CHEST 1V PORT
1 series · 1 of 1 positions shown · non-contrast
Comparison: Portable exam 3593 hours compared to 12/15/2018

CLINICAL DATA: Shortness of breath, weakness, [id number] positive

EXAM:
PORTABLE CHEST 1 VIEW

[chest ap]
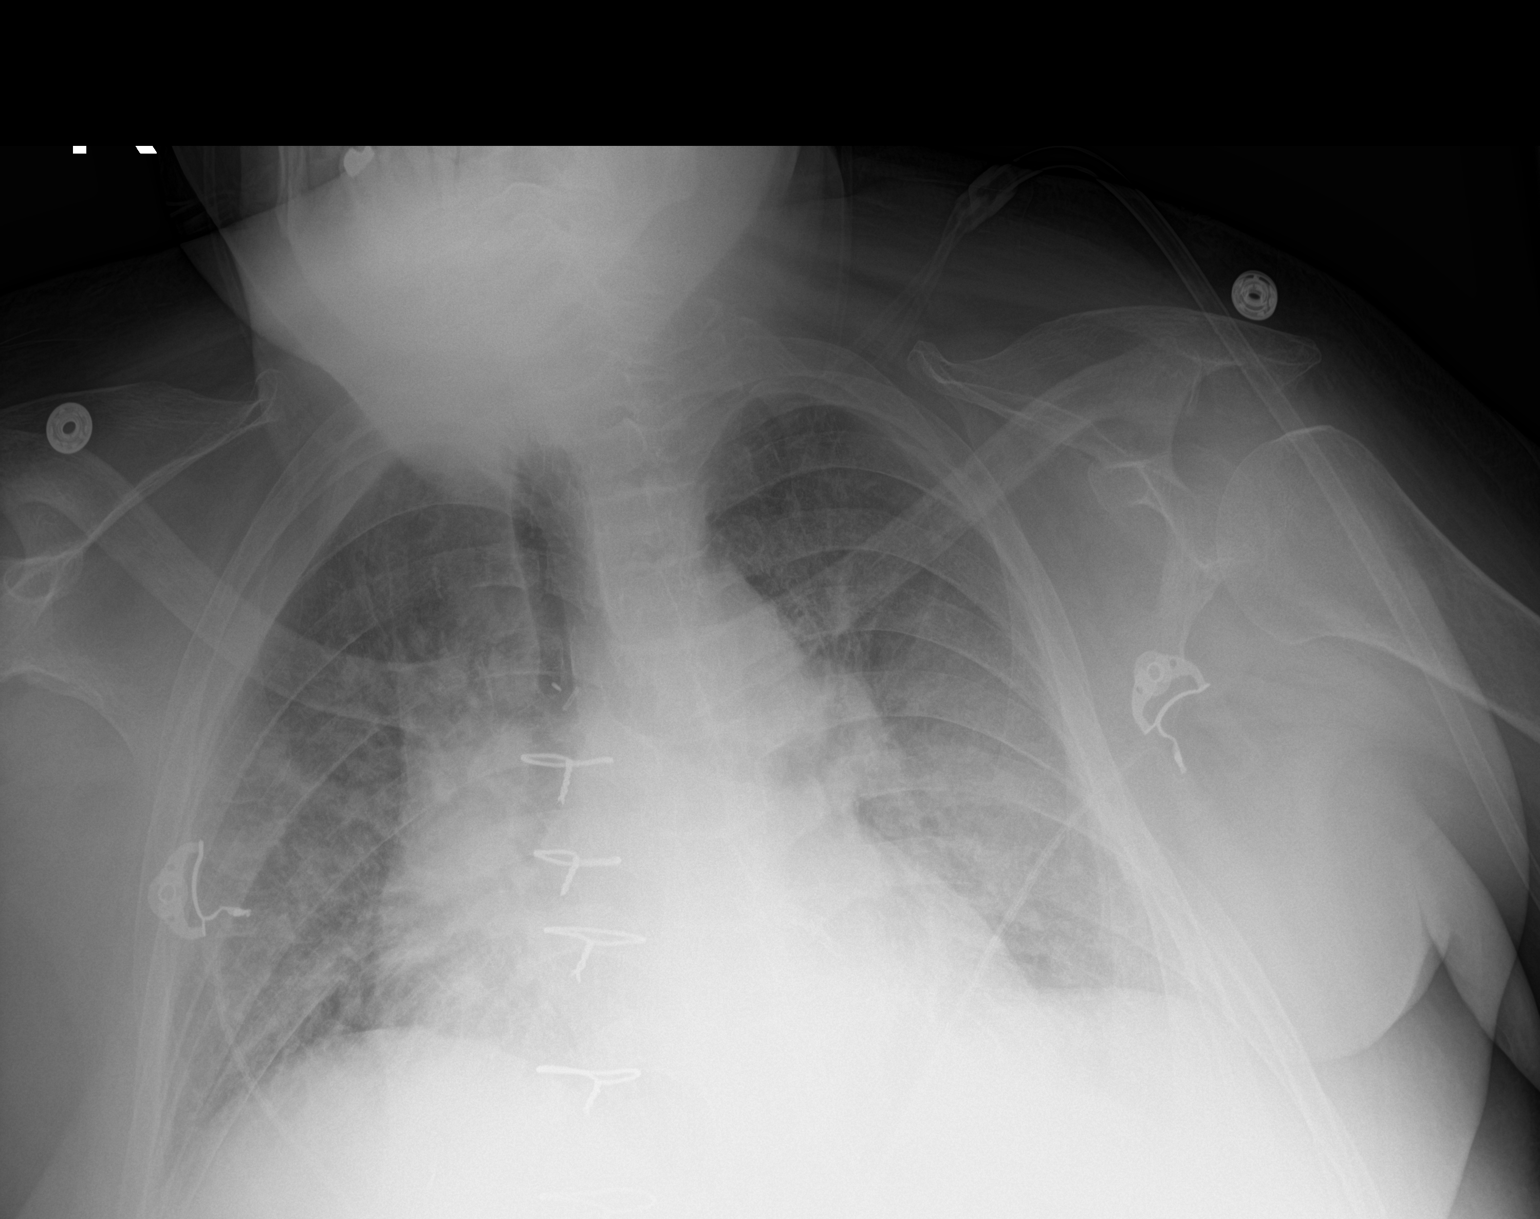

[1 of 1 positions shown; findings below may reference images not displayed]

FINDINGS: Kyphotic positioning with rotation to the RIGHT.

Enlargement of cardiac silhouette post median sternotomy.

Stable mediastinal contours.

BILATERAL patchy pulmonary infiltrates which could represent
multifocal pneumonia or pulmonary edema.

No pleural effusion or pneumothorax.

Bones demineralized.
IMPRESSION: BILATERAL pulmonary infiltrates question multifocal pneumonia versus
edema.
